# Patient Record
Sex: Female | Born: 1970 | Race: White | Hispanic: Yes | Marital: Married | Smoking: Never smoker
Health system: Southern US, Community
[De-identification: ages and names within clinical notes are randomized; demographics above are authoritative.]

## PROBLEM LIST (undated history)

## (undated) DIAGNOSIS — J302 Other seasonal allergic rhinitis: Secondary | ICD-10-CM

## (undated) DIAGNOSIS — E785 Hyperlipidemia, unspecified: Secondary | ICD-10-CM

## (undated) DIAGNOSIS — K5900 Constipation, unspecified: Secondary | ICD-10-CM

## (undated) DIAGNOSIS — Z923 Personal history of irradiation: Secondary | ICD-10-CM

## (undated) DIAGNOSIS — R748 Abnormal levels of other serum enzymes: Secondary | ICD-10-CM

## (undated) DIAGNOSIS — I8393 Asymptomatic varicose veins of bilateral lower extremities: Secondary | ICD-10-CM

## (undated) DIAGNOSIS — R739 Hyperglycemia, unspecified: Secondary | ICD-10-CM

## (undated) DIAGNOSIS — E669 Obesity, unspecified: Secondary | ICD-10-CM

## (undated) DIAGNOSIS — K760 Fatty (change of) liver, not elsewhere classified: Secondary | ICD-10-CM

## (undated) DIAGNOSIS — R920 Mammographic microcalcification found on diagnostic imaging of breast: Secondary | ICD-10-CM

## (undated) DIAGNOSIS — K644 Residual hemorrhoidal skin tags: Secondary | ICD-10-CM

## (undated) DIAGNOSIS — C50919 Malignant neoplasm of unspecified site of unspecified female breast: Secondary | ICD-10-CM

## (undated) HISTORY — DX: Obesity, unspecified: E66.9

## (undated) HISTORY — DX: Mammographic microcalcification found on diagnostic imaging of breast: R92.0

## (undated) HISTORY — DX: Malignant neoplasm of unspecified site of unspecified female breast: C50.919

## (undated) HISTORY — DX: Hyperlipidemia, unspecified: E78.5

## (undated) HISTORY — DX: Other seasonal allergic rhinitis: J30.2

## (undated) HISTORY — DX: Abnormal levels of other serum enzymes: R74.8

## (undated) HISTORY — DX: Hyperglycemia, unspecified: R73.9

## (undated) HISTORY — DX: Residual hemorrhoidal skin tags: K64.4

## (undated) HISTORY — DX: Asymptomatic varicose veins of bilateral lower extremities: I83.93

## (undated) HISTORY — DX: Constipation, unspecified: K59.00

## (undated) HISTORY — DX: Fatty (change of) liver, not elsewhere classified: K76.0

---

## 2003-03-19 ENCOUNTER — Other Ambulatory Visit: Admission: RE | Admit: 2003-03-19 | Discharge: 2003-03-19 | Payer: Self-pay | Admitting: Obstetrics and Gynecology

## 2003-06-24 ENCOUNTER — Inpatient Hospital Stay (HOSPITAL_COMMUNITY): Admission: AD | Admit: 2003-06-24 | Discharge: 2003-06-24 | Payer: Self-pay | Admitting: *Deleted

## 2003-08-15 ENCOUNTER — Inpatient Hospital Stay (HOSPITAL_COMMUNITY): Admission: AD | Admit: 2003-08-15 | Discharge: 2003-08-15 | Payer: Self-pay | Admitting: *Deleted

## 2003-08-19 ENCOUNTER — Inpatient Hospital Stay (HOSPITAL_COMMUNITY): Admission: AD | Admit: 2003-08-19 | Discharge: 2003-08-19 | Payer: Self-pay | Admitting: *Deleted

## 2003-09-02 ENCOUNTER — Inpatient Hospital Stay (HOSPITAL_COMMUNITY): Admission: AD | Admit: 2003-09-02 | Discharge: 2003-09-02 | Payer: Self-pay | Admitting: Obstetrics and Gynecology

## 2003-09-12 ENCOUNTER — Encounter: Admission: RE | Admit: 2003-09-12 | Discharge: 2003-09-12 | Payer: Self-pay | Admitting: *Deleted

## 2003-09-16 ENCOUNTER — Encounter: Admission: RE | Admit: 2003-09-16 | Discharge: 2003-09-16 | Payer: Self-pay | Admitting: *Deleted

## 2003-09-19 ENCOUNTER — Ambulatory Visit: Payer: Self-pay | Admitting: Obstetrics and Gynecology

## 2003-09-19 ENCOUNTER — Ambulatory Visit: Payer: Self-pay | Admitting: *Deleted

## 2003-09-19 ENCOUNTER — Inpatient Hospital Stay (HOSPITAL_COMMUNITY): Admission: AD | Admit: 2003-09-19 | Discharge: 2003-09-22 | Payer: Self-pay | Admitting: *Deleted

## 2003-10-02 ENCOUNTER — Ambulatory Visit: Payer: Self-pay | Admitting: Family Medicine

## 2003-12-22 ENCOUNTER — Ambulatory Visit: Payer: Self-pay | Admitting: Family Medicine

## 2004-04-06 ENCOUNTER — Ambulatory Visit: Payer: Self-pay | Admitting: Family Medicine

## 2004-04-07 ENCOUNTER — Ambulatory Visit: Payer: Self-pay | Admitting: *Deleted

## 2004-04-23 ENCOUNTER — Ambulatory Visit: Payer: Self-pay | Admitting: Family Medicine

## 2004-08-05 ENCOUNTER — Ambulatory Visit: Payer: Self-pay | Admitting: Internal Medicine

## 2005-01-04 ENCOUNTER — Ambulatory Visit: Payer: Self-pay | Admitting: Family Medicine

## 2005-04-14 ENCOUNTER — Ambulatory Visit: Payer: Self-pay | Admitting: Family Medicine

## 2005-06-15 ENCOUNTER — Ambulatory Visit: Payer: Self-pay | Admitting: Family Medicine

## 2005-06-16 ENCOUNTER — Ambulatory Visit: Payer: Self-pay | Admitting: Family Medicine

## 2005-10-06 ENCOUNTER — Ambulatory Visit: Payer: Self-pay | Admitting: Family Medicine

## 2005-12-01 ENCOUNTER — Ambulatory Visit: Payer: Self-pay | Admitting: Family Medicine

## 2006-02-15 ENCOUNTER — Ambulatory Visit: Payer: Self-pay | Admitting: Family Medicine

## 2006-07-19 ENCOUNTER — Ambulatory Visit: Payer: Self-pay | Admitting: Internal Medicine

## 2006-10-04 ENCOUNTER — Encounter (INDEPENDENT_AMBULATORY_CARE_PROVIDER_SITE_OTHER): Payer: Self-pay | Admitting: *Deleted

## 2006-10-09 ENCOUNTER — Ambulatory Visit (HOSPITAL_COMMUNITY): Admission: RE | Admit: 2006-10-09 | Discharge: 2006-10-09 | Payer: Self-pay | Admitting: Internal Medicine

## 2006-10-09 ENCOUNTER — Ambulatory Visit: Payer: Self-pay | Admitting: Internal Medicine

## 2006-10-26 ENCOUNTER — Ambulatory Visit: Payer: Self-pay | Admitting: Internal Medicine

## 2006-10-26 LAB — CONVERTED CEMR LAB
BUN: 13 mg/dL (ref 6–23)
Basophils Absolute: 0 10*3/uL (ref 0.0–0.1)
Basophils Relative: 0 % (ref 0–1)
CO2: 25 meq/L (ref 19–32)
Creatinine, Ser: 0.7 mg/dL (ref 0.40–1.20)
Eosinophils Absolute: 0.1 10*3/uL (ref 0.0–0.7)
Eosinophils Relative: 1 % (ref 0–5)
HCT: 40.1 % (ref 36.0–46.0)
Hemoglobin: 13.5 g/dL (ref 12.0–15.0)
Neutro Abs: 6.6 10*3/uL (ref 1.7–7.7)
Neutrophils Relative %: 71 % (ref 43–77)
RBC: 4.4 M/uL (ref 3.87–5.11)
Sodium: 139 meq/L (ref 135–145)

## 2006-10-27 ENCOUNTER — Encounter (INDEPENDENT_AMBULATORY_CARE_PROVIDER_SITE_OTHER): Payer: Self-pay | Admitting: Internal Medicine

## 2007-01-16 ENCOUNTER — Ambulatory Visit: Payer: Self-pay | Admitting: Internal Medicine

## 2007-06-07 ENCOUNTER — Ambulatory Visit: Payer: Self-pay | Admitting: Internal Medicine

## 2007-07-13 ENCOUNTER — Ambulatory Visit: Payer: Self-pay | Admitting: Internal Medicine

## 2007-12-17 ENCOUNTER — Ambulatory Visit: Payer: Self-pay | Admitting: Internal Medicine

## 2008-01-31 ENCOUNTER — Ambulatory Visit: Payer: Self-pay | Admitting: Internal Medicine

## 2008-02-07 ENCOUNTER — Encounter (INDEPENDENT_AMBULATORY_CARE_PROVIDER_SITE_OTHER): Payer: Self-pay | Admitting: Adult Health

## 2008-02-07 ENCOUNTER — Ambulatory Visit: Payer: Self-pay | Admitting: Internal Medicine

## 2008-02-07 LAB — CONVERTED CEMR LAB: GC Probe Amp, Genital: NEGATIVE

## 2008-03-05 ENCOUNTER — Ambulatory Visit: Payer: Self-pay | Admitting: Internal Medicine

## 2008-03-05 ENCOUNTER — Encounter (INDEPENDENT_AMBULATORY_CARE_PROVIDER_SITE_OTHER): Payer: Self-pay | Admitting: Adult Health

## 2008-03-05 LAB — CONVERTED CEMR LAB
ALT: 25 units/L (ref 0–35)
AST: 16 units/L (ref 0–37)
Basophils Relative: 0 % (ref 0–1)
CO2: 26 meq/L (ref 19–32)
Calcium: 9.4 mg/dL (ref 8.4–10.5)
Chloride: 106 meq/L (ref 96–112)
Cholesterol: 179 mg/dL (ref 0–200)
Eosinophils Relative: 1 % (ref 0–5)
Glucose, Bld: 75 mg/dL (ref 70–99)
HCT: 41.5 % (ref 36.0–46.0)
LDL Cholesterol: 114 mg/dL — ABNORMAL HIGH (ref 0–99)
Lymphocytes Relative: 32 % (ref 12–46)
MCV: 89.1 fL (ref 78.0–100.0)
Monocytes Absolute: 0.4 10*3/uL (ref 0.1–1.0)
Monocytes Relative: 5 % (ref 3–12)
Neutro Abs: 4.2 10*3/uL (ref 1.7–7.7)
RBC: 4.66 M/uL (ref 3.87–5.11)
Sodium: 141 meq/L (ref 135–145)
TSH: 0.73 microintl units/mL (ref 0.350–4.50)
Total Bilirubin: 0.6 mg/dL (ref 0.3–1.2)
Total CHOL/HDL Ratio: 3.5
VLDL: 14 mg/dL (ref 0–40)

## 2008-07-25 ENCOUNTER — Ambulatory Visit: Payer: Self-pay | Admitting: Internal Medicine

## 2008-09-04 ENCOUNTER — Ambulatory Visit: Payer: Self-pay | Admitting: Internal Medicine

## 2008-11-18 ENCOUNTER — Telehealth (INDEPENDENT_AMBULATORY_CARE_PROVIDER_SITE_OTHER): Payer: Self-pay | Admitting: *Deleted

## 2009-03-20 ENCOUNTER — Ambulatory Visit (HOSPITAL_COMMUNITY): Admission: RE | Admit: 2009-03-20 | Discharge: 2009-03-20 | Payer: Self-pay | Admitting: Family Medicine

## 2009-03-20 ENCOUNTER — Encounter (INDEPENDENT_AMBULATORY_CARE_PROVIDER_SITE_OTHER): Payer: Self-pay | Admitting: Family Medicine

## 2009-03-20 ENCOUNTER — Ambulatory Visit: Payer: Self-pay | Admitting: Internal Medicine

## 2009-03-20 LAB — CONVERTED CEMR LAB
Albumin: 4.5 g/dL (ref 3.5–5.2)
CO2: 27 meq/L (ref 19–32)
Calcium: 8.9 mg/dL (ref 8.4–10.5)
Chloride: 105 meq/L (ref 96–112)
Eosinophils Relative: 2 % (ref 0–5)
Glucose, Bld: 88 mg/dL (ref 70–99)
INR: 0.95 (ref <1.50)
Lymphocytes Relative: 39 % (ref 12–46)
Lymphs Abs: 2.7 10*3/uL (ref 0.7–4.0)
MCHC: 33.5 g/dL (ref 30.0–36.0)
MCV: 89.6 fL (ref 78.0–100.0)
Monocytes Absolute: 0.6 10*3/uL (ref 0.1–1.0)
Platelets: 200 10*3/uL (ref 150–400)
Potassium: 4.4 meq/L (ref 3.5–5.3)
Prothrombin Time: 12.6 s (ref 11.6–15.2)
RDW: 12.8 % (ref 11.5–15.5)
Sodium: 139 meq/L (ref 135–145)
Total Bilirubin: 0.3 mg/dL (ref 0.3–1.2)
Total Protein: 6.8 g/dL (ref 6.0–8.3)
WBC: 7 10*3/uL (ref 4.0–10.5)

## 2009-05-15 ENCOUNTER — Ambulatory Visit: Payer: Self-pay | Admitting: Family Medicine

## 2009-05-15 LAB — CONVERTED CEMR LAB: GC Probe Amp, Genital: NEGATIVE

## 2009-08-06 ENCOUNTER — Ambulatory Visit: Payer: Self-pay | Admitting: Family Medicine

## 2009-08-10 ENCOUNTER — Ambulatory Visit: Payer: Self-pay | Admitting: Family Medicine

## 2009-08-10 LAB — CONVERTED CEMR LAB: Vit D, 25-Hydroxy: 32 ng/mL (ref 30–89)

## 2009-12-01 ENCOUNTER — Emergency Department (HOSPITAL_COMMUNITY)
Admission: EM | Admit: 2009-12-01 | Discharge: 2009-12-01 | Payer: Self-pay | Source: Home / Self Care | Admitting: Emergency Medicine

## 2010-06-09 ENCOUNTER — Other Ambulatory Visit: Payer: Self-pay | Admitting: Family Medicine

## 2013-02-13 ENCOUNTER — Other Ambulatory Visit (HOSPITAL_COMMUNITY): Payer: Self-pay | Admitting: Internal Medicine

## 2013-04-02 ENCOUNTER — Other Ambulatory Visit (HOSPITAL_COMMUNITY): Payer: Self-pay | Admitting: Internal Medicine

## 2013-04-02 DIAGNOSIS — Z1231 Encounter for screening mammogram for malignant neoplasm of breast: Secondary | ICD-10-CM

## 2013-04-16 ENCOUNTER — Ambulatory Visit (HOSPITAL_COMMUNITY)
Admission: RE | Admit: 2013-04-16 | Discharge: 2013-04-16 | Disposition: A | Discharge status: Home / Self Care | Payer: Self-pay | Source: Ambulatory Visit | Attending: Internal Medicine | Admitting: Internal Medicine

## 2013-04-16 DIAGNOSIS — Z1231 Encounter for screening mammogram for malignant neoplasm of breast: Secondary | ICD-10-CM

## 2013-04-18 ENCOUNTER — Other Ambulatory Visit: Payer: Self-pay | Admitting: Internal Medicine

## 2013-04-18 DIAGNOSIS — R928 Other abnormal and inconclusive findings on diagnostic imaging of breast: Secondary | ICD-10-CM

## 2013-05-09 ENCOUNTER — Telehealth: Payer: Self-pay | Admitting: *Deleted

## 2013-05-09 DIAGNOSIS — R102 Pelvic and perineal pain: Secondary | ICD-10-CM

## 2013-05-09 NOTE — Telephone Encounter (Signed)
Attempted to contact patient through interpreter about ultrasound appt., no answer, left message with date/time of appt.

## 2013-05-10 ENCOUNTER — Ambulatory Visit
Admission: RE | Admit: 2013-05-10 | Discharge: 2013-05-10 | Disposition: A | Discharge status: Home / Self Care | Payer: No Typology Code available for payment source | Source: Ambulatory Visit | Attending: Internal Medicine | Admitting: Internal Medicine

## 2013-05-10 ENCOUNTER — Encounter (INDEPENDENT_AMBULATORY_CARE_PROVIDER_SITE_OTHER): Payer: Self-pay

## 2013-05-10 DIAGNOSIS — R928 Other abnormal and inconclusive findings on diagnostic imaging of breast: Secondary | ICD-10-CM

## 2013-05-15 ENCOUNTER — Ambulatory Visit (HOSPITAL_COMMUNITY)
Admission: RE | Admit: 2013-05-15 | Discharge: 2013-05-15 | Disposition: A | Discharge status: Home / Self Care | Payer: Self-pay | Source: Ambulatory Visit | Attending: Obstetrics & Gynecology | Admitting: Obstetrics & Gynecology

## 2013-05-15 DIAGNOSIS — R102 Pelvic and perineal pain: Secondary | ICD-10-CM

## 2013-05-15 DIAGNOSIS — N938 Other specified abnormal uterine and vaginal bleeding: Secondary | ICD-10-CM | POA: Insufficient documentation

## 2013-05-15 DIAGNOSIS — N949 Unspecified condition associated with female genital organs and menstrual cycle: Secondary | ICD-10-CM | POA: Insufficient documentation

## 2013-05-15 DIAGNOSIS — N925 Other specified irregular menstruation: Secondary | ICD-10-CM | POA: Insufficient documentation

## 2013-06-01 ENCOUNTER — Encounter: Payer: Self-pay | Admitting: *Deleted

## 2013-07-01 ENCOUNTER — Ambulatory Visit (INDEPENDENT_AMBULATORY_CARE_PROVIDER_SITE_OTHER): Payer: Self-pay | Admitting: Obstetrics & Gynecology

## 2013-07-01 ENCOUNTER — Encounter: Payer: Self-pay | Admitting: Obstetrics & Gynecology

## 2013-07-01 VITALS — BP 105/67 | HR 88 | Wt 163.9 lb

## 2013-07-01 DIAGNOSIS — N72 Inflammatory disease of cervix uteri: Secondary | ICD-10-CM

## 2013-07-01 LAB — WET PREP, GENITAL
Clue Cells Wet Prep HPF POC: NONE SEEN
Trich, Wet Prep: NONE SEEN

## 2013-07-01 LAB — POCT PREGNANCY, URINE: PREG TEST UR: NEGATIVE

## 2013-07-01 MED ORDER — CEFTRIAXONE SODIUM 250 MG IJ SOLR
250.0000 mg | Freq: Once | INTRAMUSCULAR | Status: AC
  Administered 2013-07-01: 250 mg via INTRAMUSCULAR

## 2013-07-01 MED ORDER — DOXYCYCLINE HYCLATE 100 MG PO CAPS: 100.0000 mg | ORAL_CAPSULE | Freq: Two times a day (BID) | ORAL | Status: DC

## 2013-07-01 NOTE — Progress Notes (Signed)
Patient ID: Nicole Holloway, female   DOB: 12/02/1970, 43 y.o.   MRN: [id number]  Chief Complaint  Patient presents with  . abnormal bleeding    HPI Nicole Holloway is a 43 y.o. female.  [id number] No LMP recorded. 5 months of intermenstrual spotting with regular normal menses, some suprapubic pain, no effect after treatment for BV and yeast. Pap was normal.  HPI  History reviewed. No pertinent past medical history.  Past Surgical History  Procedure Laterality Date  . Cesarean section      Family History  Problem Relation Age of Onset  . Diabetes Father     Social History History  Substance Use Topics  . Smoking status: Never Smoker   . Smokeless tobacco: Never Used  . Alcohol Use: No    No Known Allergies  Current Outpatient Prescriptions  Medication Sig Dispense Refill  . doxycycline (VIBRAMYCIN) 100 MG capsule Take 1 capsule (100 mg total) by mouth 2 (two) times daily.  14 capsule  0   No current facility-administered medications for this visit.    Review of Systems Review of Systems  Constitutional: Negative.   Genitourinary: Positive for vaginal bleeding, vaginal discharge and pelvic pain. Negative for menstrual problem.    Blood pressure 105/67, pulse 88, weight 74.345 kg (163 lb 14.4 oz).  Physical Exam Physical Exam  Constitutional: She is oriented to person, place, and time. She appears well-developed. No distress.  Abdominal: Soft. There is no tenderness.  Genitourinary: Vaginal discharge (spotting) found.  Mild CMT and uterus normal no mass  Neurological: She is alert and oriented to person, place, and time.  Skin: Skin is warm.  Psychiatric: She has a normal mood and affect. Her behavior is normal.    Data Reviewed referral  Assessment    STD probe and wet prep, exam c/w PID, cervicitis     Plan    Rocephin 250 mg IM and doxycycline 100 mg BID 7 days. RTC 4 weeks prn        [name] 07/01/2013, 3:49 PM

## 2013-07-01 NOTE — Patient Instructions (Signed)
Hemorragia uterina disfuncional (Dysfunctional Uterine Bleeding) Normalmente, los perodos menstruales comienzan en las jvenes de entre 11 y 62 aos. Un ciclo o perodo menstrual puede repetirse [employer] [address] y los 60 das y dura [place] 1 y 77 das. [employer] 12 y los 14 das antes de que comience el ciclo menstrual, se produce la ovulacin (los ovarios producen vulos). Cuando cuente los periodos, hgalo desde Engineer, manufacturing systems del comienzo de la hemorragia del perodo anterior Management consultant de la hemorragia del perodo siguiente. La hemorragia uterina disfuncional (anormal) es diferente del perodo menstrual normal. Los perodos pueden comenzar antes o despus que lo habitual. Pueden ser menos abundantes, presentar cogulos, o ser ms abundantes. Puede tener [employer] perodos o IT consultant un perodo o ms. Puede tener hemorragia luego de [employer], despus de la menopausia o faltarle el perodo menstrual. CAUSAS  Embarazo (normal, aborto espontneo, embarazo ectpico).  DIU (dispositivo intrauterino, anticonceptivo)  Pldoras anticonceptivas  Tratamiento hormonal  La menopausia  Infeccin en el cervix.  Problemas de coagulacin.  Infecciones de la superficie interna del tero.  Endometriosis: la superficie interna del tero se desarrolla en la pelvis y otros rganos femeninos.  Adherencias (tejido cicatrizal) dentro del tero.  Obesidad o severa prdida de peso.  Plipos en el tero.  Cncer en el crvix, la vagina o el tero.  Quiste de ovarios o Sndrome ovrico poliqustico.  Otras enfermedades (diabetes, enfermedad de la tiroides, Social research officer, government).  Fibromas en el tero (tumores no cancerosos).  Problemas con las hormonas femeninas.  Hiperplasia del endometrio: capa gruesa y clulas agrandadas dentro del tero.  Medicamentos que interfieren con la ovulacin.  Radiacin en la pelvis o el abdomen.  Quimioterapia. DIAGNSTICO  El mdico  Marketing executive historia de sus perodos [place], los medicamentos que toma, los cambios en el peso corporal, el estrs de la vida diaria y cualquier problema mdico que tenga.  El mdico realizar un examen fsico, incluyendo un examen plvico.  Tambin le solicitar anlisis para completar el diagnstico. Ellos son:  Papanicolau.  Anlisis de [place].  Cultivos para descartar infecciones.  Tomografa computada.  Ultrasonido.  Histeroscopa.  Laparoscopa.  Resonancia magntica.  Histerosalpingografa.  Dilatacin y curetaje.  Biopsia de endometrio. TRATAMIENTO El tratamiento depender de la causa de la hemorragia.. El tratamiento consiste en:  Observacin de los perodos menstruales durante algunos meses.  Prescripcin de medicamentos como:  Antibiticos:  Hormonas.  Pldoras anticonceptivas  Retirar el DIU (dispositivo intrauterino, anticonceptivo).  Ciruga:  Dilatacin y curetaje (raspado y remocin de tejido de la zona interna del tero).  Laparoscopa (examen del interior del abdomen con un tubo con luz).  Ablacin uterina (destruccin de la membrana que cubre el tero con corriente elctrica, rayos lser, congelamiento o calor ).  Histeroscopa (examen del cuello y el tero con un tubo con luz).  Histerectoma (extirpacin del tero). INSTRUCCIONES PARA EL CUIDADO DOMICILIARIO  Si el profesional que la asiste le prescribe medicamentos, tmelos tal como se le indic. No cambie ni reemplace medicamentos sin consultarlo con Writer.  Las hemorragias de larga duracin pueden traen como consecuencia un dficit de hierro. El profesional que lo asiste podr Conservation officer, nature. Esto ayuda a Air cabin crew que el organismo pierde luego de una hemorragia abundante. Tome los medicamentos tal como se le indic.  No tome aspirina o medicamentos que la contengan u desde una semana antes del perodo menstrual ni durante el mismo. La aspirina puede hacer  que la hemorragia empeore.  Si necesita cambiar el apsito o el tampn mas de una vez cada 2 horas, permanezca en cama con los pies elevados y aplique una compresa fra en la zona baja del abdomen. Descanse todo lo que pueda hasta que la hemorragia se detenga.  Consuma alimentos balanceados. Coma alimentos ricos en hierro. Por ejemplo:  Vegetales verdes frescos.  Cereales integrales y cereales y panes con salvado.  Huevos.  Carnes.  Hgado.  No trate de perder peso hasta que la hemorragia anormal se detenga y los niveles de hierro en la sangre vuelvan a la normalidad. No levante pesos de ms de 10 libras (4.5 kg.) ni realice actividades extenuantes mientras tenga la hemorragia.  Durante un par de meses tome nota en un almanaque y [employer] comienzo y el fin de su perodo menstrual y el tipo de sangrado (escaso, [place], [place], gotas, cogulos o falta de perodo). El objetivo es que su mdico evale mejor el problema. SOLICITE ATENCIN MDICA SI:  Debe cambiar el apsito o el tampn ms de una vez cada hora.  Se siente mareada o dbil.  Tiene algn problema que pueda relacionarse con el medicamento que est tomando.  Siente dolor.  Desea retirar su DIU.  Quiere suspender o cambiar sus pldoras anticonceptivas u hormonas.  Tiene algn tipo de hemorragia anormal mencionada anteriormente.  Tiene ms de 45 aos y an no ha tenido su perodo menstrual.  Tiene 45 aos y an tiene Chartered certified accountant.  Tiene alguno de los sntomas mencionados anteriormente.  Aparece una erupcin cutnea. SOLICITE ATENCIN MDICA DE INMEDIATO SI:  La temperatura oral se eleva sin motivo por encima de 38,9 C (102 F).  Comienza a sentir escalofros.  Debe cambiar el apsito o el tampn ms de una vez cada hora.  Siente dolor abdominal.  Pierde el conocimiento, se desmaya. Document Released: 10/13/2004 Document Revised: 09/28/2011 [facility] Patient Information 2014 [place], [place].

## 2013-07-01 NOTE — Progress Notes (Signed)
Patient reports that she has a period every month and then it will stop but bleeding comes back. She bleeds everyday

## 2013-07-03 ENCOUNTER — Telehealth: Payer: Self-pay | Admitting: General Practice

## 2013-07-03 ENCOUNTER — Other Ambulatory Visit: Payer: Self-pay

## 2013-07-03 DIAGNOSIS — B379 Candidiasis, unspecified: Secondary | ICD-10-CM

## 2013-07-03 DIAGNOSIS — N72 Inflammatory disease of cervix uteri: Secondary | ICD-10-CM | POA: Insufficient documentation

## 2013-07-03 MED ORDER — FLUCONAZOLE 150 MG PO TABS: 150.0000 mg | ORAL_TABLET | Freq: Once | ORAL | Status: DC

## 2013-07-03 NOTE — Telephone Encounter (Signed)
Patient came to front desk. Explained to patient with help of Manuela Schwartz results and medication at pharmacy. Patient verbalized understanding. No questions or concerns.

## 2013-07-03 NOTE — Telephone Encounter (Signed)
Patient called and requests Diflucan be sent to [facility] on [address] as it will be cheaper. E-prescribed to preferred pharmacy.

## 2013-07-03 NOTE — Telephone Encounter (Signed)
Message copied by Shelly Coss on Wed Jul 03, 2013 11:01 AM ------      Message from: Woodroe Mode      Created: Wed Jul 03, 2013  9:15 AM       Diflucan 150 mg po for yeast ------

## 2013-07-03 NOTE — Telephone Encounter (Signed)
Called patient with pacific interpreter [phone] and patient's listed number was invalid. Called patient at contact's number and he stated he wasn't with her but would have her call us here. Med ordered

## 2013-07-04 LAB — GC/CHLAMYDIA PROBE AMP
CT Probe RNA: NEGATIVE
GC PROBE AMP APTIMA: NEGATIVE

## 2013-07-29 ENCOUNTER — Telehealth: Payer: Self-pay | Admitting: General Practice

## 2013-07-29 ENCOUNTER — Encounter: Payer: Self-pay | Admitting: General Practice

## 2013-07-29 ENCOUNTER — Ambulatory Visit: Payer: Self-pay | Admitting: Obstetrics & Gynecology

## 2013-07-29 NOTE — Telephone Encounter (Signed)
Attempted to call patient with blanca for interpreter but only listed number was invalid. Will send letter

## 2013-10-04 ENCOUNTER — Other Ambulatory Visit: Payer: Self-pay | Admitting: Internal Medicine

## 2013-10-04 DIAGNOSIS — R921 Mammographic calcification found on diagnostic imaging of breast: Secondary | ICD-10-CM

## 2013-10-16 ENCOUNTER — Ambulatory Visit
Admission: RE | Admit: 2013-10-16 | Discharge: 2013-10-16 | Disposition: A | Discharge status: Home / Self Care | Payer: No Typology Code available for payment source | Source: Ambulatory Visit | Attending: Internal Medicine | Admitting: Internal Medicine

## 2013-10-16 DIAGNOSIS — R921 Mammographic calcification found on diagnostic imaging of breast: Secondary | ICD-10-CM

## 2013-10-28 ENCOUNTER — Ambulatory Visit
Admission: RE | Admit: 2013-10-28 | Discharge: 2013-10-28 | Disposition: A | Discharge status: Home / Self Care | Payer: No Typology Code available for payment source | Source: Ambulatory Visit | Attending: Internal Medicine | Admitting: Internal Medicine

## 2013-10-28 ENCOUNTER — Other Ambulatory Visit: Payer: Self-pay | Admitting: Internal Medicine

## 2013-10-28 DIAGNOSIS — R921 Mammographic calcification found on diagnostic imaging of breast: Secondary | ICD-10-CM

## 2013-11-18 ENCOUNTER — Ambulatory Visit (INDEPENDENT_AMBULATORY_CARE_PROVIDER_SITE_OTHER): Payer: Self-pay | Admitting: Obstetrics & Gynecology

## 2013-11-18 ENCOUNTER — Encounter: Payer: Self-pay | Admitting: Obstetrics & Gynecology

## 2013-11-18 VITALS — BP 103/54 | HR 79 | Temp 99.1°F | Wt 171.9 lb

## 2013-11-18 DIAGNOSIS — N938 Other specified abnormal uterine and vaginal bleeding: Secondary | ICD-10-CM

## 2013-11-18 NOTE — Patient Instructions (Signed)
Perimenopausia (Perimenopause) La perimenopausia es el momento en que su cuerpo comienza a pasar a la menopausia (sin menstruacin durante 12 meses consecutivos). Es un proceso natural. La perimenopausia puede comenzar entre 2 y 33 aos antes de la menopausia y por lo general tiene una duracin de 1 ao ms pasada la menopausia. [employer], los ovarios podran producir un vulo o no. Los ovarios varan su produccin de las hormonas estrgeno y Technical brewer. Esto puede causar perodos menstruales irregulares, dificultad para quedar embarazada, hemorragia vaginal entre perodos y sntomas incmodos. CAUSAS  Produccin irregular de las hormonas ovricas estrgeno y Immunologist, y no ovular todos los meses.  Otras causas son:  Tumor de la glndula pituitaria.  Enfermedades que [employer] ovarios.  Radioterapia.  Quimioterapia.  Causas desconocidas.  Fumar mucho y abusar del consumo de alcohol puede llevar a que la perimenopausia aparezca antes. SIGNOS Y SNTOMAS   Acaloramiento.  Sudoracin nocturna.  Perodos menstruales irregulares.  Disminucin del deseo sexual.  Sequedad vaginal.  Dolores de cabeza.  Cambios en el estado de nimo.  Depresin.  Problemas de memoria.  Irritabilidad.  Cansancio.  Aumento de [place].  Problemas para quedar embarazada.  Prdida de clulas seas (osteoporosis).  Comienzo de endurecimiento de las arterias (aterosclerosis). DIAGNSTICO  El mdico realizar un diagnstico en funcin de su edad, historial de perodos menstruales y sntomas. Le realizarn un examen fsico para ver si hay algn cambio en su cuerpo, en especial en sus rganos reproductores. Las pruebas hormonales pueden ser o no tiles segn la cantidad de hormonas femeninas que produzca y [employer] produzca. Sin embargo, podrn Microbiologist pruebas hormonales para Statistician. TRATAMIENTO  En algunos casos, no se necesita tratamiento. La  decisin acerca de qu tratamiento es necesario durante la perimenopausia deber realizarse en conjunto con su mdico segn cmo estn afectando los sntomas a su estilo de vida. Existen varios tratamientos disponibles, como:  Risk manager cada sntoma individual con medicamentos especficos para ese sntoma.  Algunos medicamentos herbales pueden ayudar en sntomas especficos.  Psicoterapia.  Terapia grupal. INSTRUCCIONES PARA EL CUIDADO EN EL HOGAR   Controle sus periodos menstruales (cundo ocurren, qu tan abundantes son, cunto tiempo pasa entre perodos, y cunto duran) como tambin sus sntomas y cundo comenzaron.  Tome slo medicamentos de venta libre o recetados, segn las indicaciones del mdico.  Duerma y descanse.  Haga actividad fsica.  Consuma una dieta que contenga calcio (bueno para los [place]) y productos derivados de la soja (actan como estrgenos).  No fume.  Evite las bebidas alcohlicas.  Tome los suplementos vitamnicos segn las indicaciones del mdico. En ciertos casos, puede ser de [place] tomar vitamina E.  Tome suplementos de calcio y vitamina D para ayudar a Publishing rights manager prdida sea.  En algunos casos la terapia de grupo podr ayudarla.  La acupuntura puede ser de ayuda en ciertos casos. SOLICITE ATENCIN MDICA SI:   Tiene preguntas acerca de sus sntomas.  Necesita ser derivada a un especialista (gineclogo, psiquiatra, o psiclogo). SOLICITE ATENCIN MDICA DE INMEDIATO SI:   Sufre una hemorragia vaginal abundante.  Su perodo menstrual dura ms de 8 das.  Sus perodos son recurrentes cada menos de [address]  Tiene hemorragias durante las [employer].  Est muy deprimido.  Siente dolor al [employer].  Siente dolor de cabeza intenso.  Tiene problemas de visin. Document Released: 01/03/2005 Document Revised: 10/24/2012 [facility] Patient Information 2015 [place], [place]. This information is not intended to replace advice given to you  by your health care provider. Make sure you discuss any questions you have with your health care provider.

## 2013-11-18 NOTE — Progress Notes (Signed)
Subjective:     Patient ID: Nicole Holloway, female   DOB: Oct 16, 1970, 43 y.o.   MRN: [id number]  HPI Continues to have irregular bleeding, sometimes heavy, no pain.   Review of Systems  Constitutional: Positive for unexpected weight change. Negative for fever and fatigue.  Genitourinary: Positive for menstrual problem. Negative for vaginal discharge and pelvic pain.       Objective:   Physical Exam  Constitutional: She is oriented to person, place, and time. She appears well-developed. No distress.  Night sweats  Pulmonary/Chest: Effort normal. No respiratory distress.  Neurological: She is alert and oriented to person, place, and time.  Skin: No pallor.  Psychiatric: She has a normal mood and affect. Her behavior is normal.       Assessment:     DUB, suspect perimenopause    Plan:     TSH, FSH, CBC. Provera 5 mg, 2 PO until no bleeding then 1 daily, RTC 3 mo   Woodroe Mode, MD 11/18/2013

## 2013-11-19 LAB — CBC
HCT: 36.6 % (ref 36.0–46.0)
HEMOGLOBIN: 12.8 g/dL (ref 12.0–15.0)
MCH: 30.3 pg (ref 26.0–34.0)
MCHC: 35 g/dL (ref 30.0–36.0)
MCV: 86.7 fL (ref 78.0–100.0)
PLATELETS: 209 10*3/uL (ref 150–400)
RBC: 4.22 MIL/uL (ref 3.87–5.11)
RDW: 13.7 % (ref 11.5–15.5)
WBC: 7.2 10*3/uL (ref 4.0–10.5)

## 2013-11-19 LAB — FOLLICLE STIMULATING HORMONE: FSH: 2.8 m[IU]/mL

## 2013-11-19 LAB — TSH: TSH: 0.799 u[IU]/mL (ref 0.350–4.500)

## 2013-11-20 ENCOUNTER — Telehealth: Payer: Self-pay | Admitting: *Deleted

## 2013-11-20 NOTE — Telephone Encounter (Signed)
Called patient back using spanish interpreter Larkin Ina, no answer message left to call us back.

## 2013-11-20 NOTE — Telephone Encounter (Signed)
Patient left message with her phone number and that she speaks spanish.

## 2013-11-25 ENCOUNTER — Telehealth: Payer: Self-pay | Admitting: General Practice

## 2013-11-25 DIAGNOSIS — N938 Other specified abnormal uterine and vaginal bleeding: Secondary | ICD-10-CM

## 2013-11-25 MED ORDER — MEDROXYPROGESTERONE ACETATE 5 MG PO TABS: ORAL_TABLET | ORAL | Status: DC

## 2013-11-25 NOTE — Telephone Encounter (Signed)
Charted under another encounter

## 2013-11-25 NOTE — Telephone Encounter (Signed)
Patient called and left message stating she went to her pharmacy and the medication isn't there. Per chart review medication was never ordered during last visit here. Called Dr Roselie Awkward to verify provera 5mg - take 10 mg daily until bleeding stops then one tab 5mg  daily disp 60 with 2 refills. Called patient back with Cristie Hem for interpreter and informed patient of medication being sent to pharmacy. Patient verbalized understanding and had no other questions

## 2013-11-27 ENCOUNTER — Telehealth: Payer: Self-pay | Admitting: *Deleted

## 2013-11-27 NOTE — Telephone Encounter (Addendum)
Pt left voice mail message in [place]. Will return call to pt with interpreter.  I called pt @ 1545 with Engineer, site # [phone].  Pt wanted to know her results from recent visit. I advised her that all of her lab test results were normal. Pt voiced understanding and had no additional questions.

## 2014-04-29 ENCOUNTER — Other Ambulatory Visit: Payer: Self-pay | Admitting: Internal Medicine

## 2014-04-29 ENCOUNTER — Other Ambulatory Visit: Payer: Self-pay

## 2014-04-29 DIAGNOSIS — R921 Mammographic calcification found on diagnostic imaging of breast: Secondary | ICD-10-CM

## 2014-05-02 ENCOUNTER — Other Ambulatory Visit: Payer: Self-pay | Admitting: Internal Medicine

## 2014-05-02 ENCOUNTER — Ambulatory Visit
Admission: RE | Admit: 2014-05-02 | Discharge: 2014-05-02 | Disposition: A | Discharge status: Home / Self Care | Payer: No Typology Code available for payment source | Source: Ambulatory Visit | Attending: Internal Medicine | Admitting: Internal Medicine

## 2014-05-02 DIAGNOSIS — R921 Mammographic calcification found on diagnostic imaging of breast: Secondary | ICD-10-CM

## 2014-06-04 DIAGNOSIS — E785 Hyperlipidemia, unspecified: Secondary | ICD-10-CM

## 2014-06-04 HISTORY — DX: Hyperlipidemia, unspecified: E78.5

## 2014-06-09 DIAGNOSIS — R739 Hyperglycemia, unspecified: Secondary | ICD-10-CM

## 2014-06-09 HISTORY — DX: Hyperglycemia, unspecified: R73.9

## 2014-07-03 DIAGNOSIS — R920 Mammographic microcalcification found on diagnostic imaging of breast: Secondary | ICD-10-CM

## 2014-07-03 HISTORY — DX: Mammographic microcalcification found on diagnostic imaging of breast: R92.0

## 2014-09-05 DIAGNOSIS — K760 Fatty (change of) liver, not elsewhere classified: Secondary | ICD-10-CM

## 2014-09-05 HISTORY — DX: Fatty (change of) liver, not elsewhere classified: K76.0

## 2014-11-25 ENCOUNTER — Encounter: Payer: Self-pay | Admitting: Internal Medicine

## 2014-11-25 DIAGNOSIS — K644 Residual hemorrhoidal skin tags: Secondary | ICD-10-CM | POA: Insufficient documentation

## 2014-11-25 DIAGNOSIS — I8393 Asymptomatic varicose veins of bilateral lower extremities: Secondary | ICD-10-CM | POA: Insufficient documentation

## 2014-11-25 DIAGNOSIS — K5900 Constipation, unspecified: Secondary | ICD-10-CM | POA: Insufficient documentation

## 2014-11-25 DIAGNOSIS — J302 Other seasonal allergic rhinitis: Secondary | ICD-10-CM | POA: Insufficient documentation

## 2014-11-26 ENCOUNTER — Encounter: Payer: Self-pay | Admitting: Internal Medicine

## 2014-11-26 ENCOUNTER — Ambulatory Visit (INDEPENDENT_AMBULATORY_CARE_PROVIDER_SITE_OTHER): Payer: Self-pay | Admitting: Internal Medicine

## 2014-11-26 VITALS — BP 116/78 | HR 76 | Ht 62.0 in | Wt 167.0 lb

## 2014-11-26 DIAGNOSIS — K760 Fatty (change of) liver, not elsewhere classified: Secondary | ICD-10-CM

## 2014-11-26 DIAGNOSIS — E669 Obesity, unspecified: Secondary | ICD-10-CM

## 2014-11-26 NOTE — Progress Notes (Signed)
   Subjective:    Patient ID: Nicole Holloway, female    DOB: 02-18-1970, 44 y.o.   MRN: [id number]  HPI  Fatty Liver with history of elevated transaminases: FLP in July with total cholesterol of 156, HDL 40, Trig nl, LDL 98. Trying to be physically active--considering swimming with daughter. Morning meal:  Oatmeal with green apple and celery and OJ and water and nuts and flaxseed. Lunch:  Sandwich with whole grain, ham, cheese, mayo, tomato, lettuce and onion.  Water. Was drinking a lot of soda before--3 weeks ago.  Stopped when started working.   Dinner:  Baked or grilled chicken, rice, cooked veggies.   Eats with family with TV off almost daily    Review of Systems     Objective:   Physical Exam No change       Assessment & Plan:  Fatty Liver with history of elevated transaminases, the latter resolved. Has lost 4 lbs since August. Much more active and with a much better diet. Long discussion on other ways she can improve her lifestyle. Seems very interested in doing this for both herself and her daughter. Discussed free flu vaccines on Nov 17th during orange card sign up.

## 2014-11-26 NOTE — Patient Instructions (Signed)
Drink a glass of water before every meal Drink 6-8 glasses of water daily Eat three meals daily Eat a protein and healthy fat with every meal (eggs,fish, chicken, turkey and limit red meats) Eat 5 servings of vegetables daily, mix the colors Eat 2 servings of fruit daily with skin, if skin is edible Use smaller plates Put food/utensils down as you chew and swallow each bite Eat at a table with friends/family at least once daily, no TV Do not eat in front of the TV 

## 2015-01-18 DIAGNOSIS — Z923 Personal history of irradiation: Secondary | ICD-10-CM

## 2015-01-18 HISTORY — DX: Personal history of irradiation: Z92.3

## 2015-01-19 ENCOUNTER — Encounter: Payer: Self-pay | Admitting: Internal Medicine

## 2015-02-05 ENCOUNTER — Ambulatory Visit (INDEPENDENT_AMBULATORY_CARE_PROVIDER_SITE_OTHER): Payer: Self-pay | Admitting: Internal Medicine

## 2015-02-05 ENCOUNTER — Encounter: Payer: Self-pay | Admitting: Internal Medicine

## 2015-02-05 VITALS — BP 110/76 | HR 76 | Temp 98.3°F | Ht 62.0 in | Wt 168.5 lb

## 2015-02-05 DIAGNOSIS — Z91048 Other nonmedicinal substance allergy status: Secondary | ICD-10-CM

## 2015-02-05 DIAGNOSIS — Z9109 Other allergy status, other than to drugs and biological substances: Secondary | ICD-10-CM

## 2015-02-05 MED ORDER — FEXOFENADINE HCL 180 MG PO TABS: 180.0000 mg | ORAL_TABLET | Freq: Every day | ORAL | Status: DC

## 2015-02-05 NOTE — Progress Notes (Signed)
   Subjective:    Patient ID: Nicole Holloway, female    DOB: 02-07-1970, 45 y.o.   MRN: [id number]  HPI  1.  Red eyes:  Had red eyes for 2 days earlier in the week, but still with burning and itching of both eyes for 4 days.  Occurred about 2-3 weeks ago, lasting about 3 days as well. Her left eyelid has been twitching as well. Has itching of nose and today, nose started to run--mucous is clear.  Not really sneezing.  No throat symptoms. Dry cough. Pt. Has history of seasonal allergies--but has not had the redness before. No fever. Has not used any OTC medications for this.  Has not been outside much.    2.  Chest pain:  High sternal area.  Started at beginning of week with red eyes.  Has been coughing.  Hurts with cough.  Hurts only when presses in area.  Meds:  None  No Known Allergies  Review of Systems     Objective:   Physical Exam HEENT:  PERRL, EOMI, conjunctivae mildly injected bilaterally, dark circles under eyes, Nasal mucosa swollen, cobbling of posterior pharynx.  TMs pearly gray Neck:  Supple, no adenopathy Chest: CTA  Tender over parasternal/costochondral area--reproduces the chest pain CV:  RRR without murmur or rub       Assessment & Plan:  Allergies with recent rain and increased temp vs. Viral  Chest wall pain from cough Recommended Fexofenadine 180 mg daily. If not relieved, add nasal corticosteroids.

## 2015-02-05 NOTE — Patient Instructions (Signed)
Allegra (Fexofenadine)  180 mg una vez al dia para alergias (a necesita)

## 2015-02-25 ENCOUNTER — Ambulatory Visit (INDEPENDENT_AMBULATORY_CARE_PROVIDER_SITE_OTHER): Payer: Self-pay | Admitting: Internal Medicine

## 2015-02-25 VITALS — BP 112/76 | HR 74 | Ht 62.0 in | Wt 171.0 lb

## 2015-02-25 DIAGNOSIS — M62838 Other muscle spasm: Secondary | ICD-10-CM

## 2015-02-25 DIAGNOSIS — R5382 Chronic fatigue, unspecified: Secondary | ICD-10-CM

## 2015-02-25 DIAGNOSIS — R748 Abnormal levels of other serum enzymes: Secondary | ICD-10-CM

## 2015-02-25 DIAGNOSIS — Z23 Encounter for immunization: Secondary | ICD-10-CM

## 2015-02-25 DIAGNOSIS — K760 Fatty (change of) liver, not elsewhere classified: Secondary | ICD-10-CM

## 2015-02-25 DIAGNOSIS — M6580 Other synovitis and tenosynovitis, unspecified site: Secondary | ICD-10-CM

## 2015-02-25 DIAGNOSIS — M76891 Other specified enthesopathies of right lower limb, excluding foot: Secondary | ICD-10-CM

## 2015-02-25 NOTE — Progress Notes (Signed)
   Subjective:    Patient ID: Nicole Holloway, female    DOB: July 12, 1970, 45 y.o.   MRN: [id number]  HPI  1.  Red eyes and chest pain improved since seen recently and started on Allegra.  2.  Hepatic steatosis and weight concerns:  Has gained weight back over the cooler months.  States with work and the cold outside, has not been exercises.   Was walking with a neighbor friend, but they just sort of stopped with time.  She often walked by herself.   States she is still eating healthy. Just feels fatigued a lot and not physically active now.  Did feel better when was more active.  3.  Right posterior leg pain at level of knee.  Past 4 days.  Does not stretch before walking.  No swelling or redness of leg.  Feels like something is stretching the tissue behind her knee.    4.  Getting muscle cramps at times.  Does drink water during the day--4-5 water bottles.  Had a CMP in July of 2016 with minimally low Calcium.  Has not had CBC or TSH recently  Medications Allegra 180 mg daily as needed  Allergies:   NKDA Review of Systems     Objective:   Physical Exam  NAD HEENT:  No conjunctival injection, PERRL, EOMI, TMs pearly gray, throat without injection, nasal mucosa without swelling/inflammation Neck:  Supple, no adenopathy, no thyromegaly  Chest:  CTA CV:  RRR without murmur or rub, radial pulses normal and equal Abd:  S, NT, No HSM or masses, +BS Right Knee:  No palpable effusion, full ROM, Nontender over patella, NT over joint lines.  No laxity or pain on cruciate and collateral ligament stress maneuvers. Tender over tendons of posterior knee (R) to insertion (both medial and lateral tendons      Assessment & Plan:  1.  Allergies:  Improved with Allegra  2.  Elevated liver enzymes with hepatic steatosis by ultrasound 08/2014.  Have improved with time.  Check to see if ever received records from [facility] regarding Hepatitis evaluation in past.  Was told to go to [facility] for Hep B  vaccination when patient at [facility].  If unable to find, check viral Hepatitis panel on return.  3.  Hepatic steatosis:  Needs to lose weight.  Encouraged patient to find alternative physical activity when does not want to be outside.  4. Right knee:  Appears to be a mild tendinitis:  Stretches given, before and after walking.  5.  Leg cramps:  Check TSH, CMP with recent mildly low Calcium.  Does have history also of low Vitamin D on chart review, but resolved with replacement.

## 2015-02-25 NOTE — Patient Instructions (Signed)
Do leg stretches 2 times daily--count to 20 as you stretch the back of your legs--count to 20 each time.

## 2015-02-26 LAB — CBC WITH DIFFERENTIAL/PLATELET
BASOS ABS: 0 10*3/uL (ref 0.0–0.2)
BASOS: 0 %
EOS (ABSOLUTE): 0.1 10*3/uL (ref 0.0–0.4)
Eos: 1 %
Hematocrit: 39.7 % (ref 34.0–46.6)
Hemoglobin: 13.7 g/dL (ref 11.1–15.9)
IMMATURE GRANS (ABS): 0 10*3/uL (ref 0.0–0.1)
Immature Granulocytes: 0 %
LYMPHS ABS: 2.8 10*3/uL (ref 0.7–3.1)
LYMPHS: 30 %
MCH: 30.9 pg (ref 26.6–33.0)
MCHC: 34.5 g/dL (ref 31.5–35.7)
MCV: 89 fL (ref 79–97)
MONOS ABS: 0.7 10*3/uL (ref 0.1–0.9)
Monocytes: 7 %
NEUTROS ABS: 5.8 10*3/uL (ref 1.4–7.0)
Neutrophils: 62 %
Platelets: 232 10*3/uL (ref 150–379)
RBC: 4.44 x10E6/uL (ref 3.77–5.28)
RDW: 13.3 % (ref 12.3–15.4)
WBC: 9.4 10*3/uL (ref 3.4–10.8)

## 2015-02-26 LAB — COMPREHENSIVE METABOLIC PANEL
ALT: 36 IU/L — AB (ref 0–32)
AST: 17 IU/L (ref 0–40)
Albumin/Globulin Ratio: 1.7 (ref 1.1–2.5)
Albumin: 4.4 g/dL (ref 3.5–5.5)
Alkaline Phosphatase: 84 IU/L (ref 39–117)
BILIRUBIN TOTAL: 0.3 mg/dL (ref 0.0–1.2)
BUN / CREAT RATIO: 18 (ref 9–23)
BUN: 12 mg/dL (ref 6–24)
CALCIUM: 9 mg/dL (ref 8.7–10.2)
CHLORIDE: 100 mmol/L (ref 96–106)
CO2: 24 mmol/L (ref 18–29)
Creatinine, Ser: 0.65 mg/dL (ref 0.57–1.00)
GFR calc Af Amer: 126 mL/min/{1.73_m2} (ref >59)
GFR, EST NON AFRICAN AMERICAN: 109 mL/min/{1.73_m2} (ref >59)
Globulin, Total: 2.6 g/dL (ref 1.5–4.5)
Glucose: 83 mg/dL (ref 65–99)
POTASSIUM: 4.2 mmol/L (ref 3.5–5.2)
Sodium: 140 mmol/L (ref 134–144)
TOTAL PROTEIN: 7 g/dL (ref 6.0–8.5)

## 2015-02-26 LAB — TSH: TSH: 0.738 u[IU]/mL (ref 0.450–4.500)

## 2015-03-16 ENCOUNTER — Encounter: Payer: Self-pay | Admitting: Internal Medicine

## 2015-03-16 DIAGNOSIS — R748 Abnormal levels of other serum enzymes: Secondary | ICD-10-CM | POA: Insufficient documentation

## 2015-03-30 ENCOUNTER — Other Ambulatory Visit: Payer: Self-pay | Admitting: Internal Medicine

## 2015-03-30 DIAGNOSIS — R921 Mammographic calcification found on diagnostic imaging of breast: Secondary | ICD-10-CM

## 2015-04-18 DIAGNOSIS — C50919 Malignant neoplasm of unspecified site of unspecified female breast: Secondary | ICD-10-CM | POA: Insufficient documentation

## 2015-04-18 HISTORY — DX: Malignant neoplasm of unspecified site of unspecified female breast: C50.919

## 2015-05-04 ENCOUNTER — Other Ambulatory Visit: Payer: No Typology Code available for payment source

## 2015-05-05 ENCOUNTER — Ambulatory Visit
Admission: RE | Admit: 2015-05-05 | Discharge: 2015-05-05 | Disposition: A | Discharge status: Home / Self Care | Payer: No Typology Code available for payment source | Source: Ambulatory Visit | Attending: Internal Medicine | Admitting: Internal Medicine

## 2015-05-05 ENCOUNTER — Other Ambulatory Visit: Payer: Self-pay | Admitting: Internal Medicine

## 2015-05-05 DIAGNOSIS — R921 Mammographic calcification found on diagnostic imaging of breast: Secondary | ICD-10-CM

## 2015-05-05 DIAGNOSIS — N6310 Unspecified lump in the right breast, unspecified quadrant: Secondary | ICD-10-CM

## 2015-05-07 ENCOUNTER — Ambulatory Visit (HOSPITAL_COMMUNITY)
Admission: RE | Admit: 2015-05-07 | Discharge: 2015-05-07 | Disposition: A | Discharge status: Home / Self Care | Payer: Self-pay | Source: Ambulatory Visit | Attending: Obstetrics and Gynecology | Admitting: Obstetrics and Gynecology

## 2015-05-07 ENCOUNTER — Encounter (HOSPITAL_COMMUNITY): Payer: Self-pay

## 2015-05-07 VITALS — BP 110/68 | Temp 99.3°F | Ht 61.0 in | Wt 169.6 lb

## 2015-05-07 DIAGNOSIS — Z1239 Encounter for other screening for malignant neoplasm of breast: Secondary | ICD-10-CM

## 2015-05-07 NOTE — Progress Notes (Signed)
Complaints of some breast soreness since mammogram on 05/05/2015.  Pap Smear: Pap smear not completed today. Last Pap smear was around June 2016 at the [facility] and normal per patient. Per patient has no history of an abnormal Pap smear. No Pap smear results are in EPIC.  Physical exam: Breasts Breasts symmetrical. No skin abnormalities bilateral breasts. No nipple retraction bilateral breasts. No nipple discharge bilateral breasts. No lymphadenopathy. No lumps palpated bilateral breasts. Complaints of left outer breast soreness on exam. Referred patient to the [facility] for a right breast  biopsy per recommendation. Appointment scheduled for Thursday, May 14, 2015 at 0900.  Pelvic/Bimanual No Pap smear completed today since last Pap smear was in June 2016 per patient. Pap smear not indicated per BCCCP guidelines.   Smoking History: Patient has never smoked.  Patient Navigation: Patient education provided. Access to services provided for patient through [facility] program. Spanish interpreter provided.   Used Spanish interpreter Cresenciano Genre at [facility]

## 2015-05-07 NOTE — Patient Instructions (Addendum)
Educational materials on self breast awareness given. Explained to Nicole Holloway that she did not need a Pap smear today due to last Pap smear was around June 2016 per patient. Let her know BCCCP will cover Pap smears every 3 years unless has a history of abnormal Pap smears. Referred patient to the [place] [facility] for a right breast  biopsy per recommendation. Appointment scheduled for Thursday, May 14, 2015 at 0900. Patient aware of appointment and will be there. [name] verbalized understanding.  Lizeth Bencosme, Arvil Chaco, RN 5:01 PM

## 2015-05-11 ENCOUNTER — Other Ambulatory Visit: Payer: No Typology Code available for payment source

## 2015-05-12 ENCOUNTER — Encounter (HOSPITAL_COMMUNITY): Payer: Self-pay | Admitting: *Deleted

## 2015-05-14 ENCOUNTER — Ambulatory Visit
Admission: RE | Admit: 2015-05-14 | Discharge: 2015-05-14 | Disposition: A | Discharge status: Home / Self Care | Payer: No Typology Code available for payment source | Source: Ambulatory Visit | Attending: Internal Medicine | Admitting: Internal Medicine

## 2015-05-14 ENCOUNTER — Other Ambulatory Visit: Payer: Self-pay | Admitting: Internal Medicine

## 2015-05-14 ENCOUNTER — Encounter: Payer: Self-pay | Admitting: Internal Medicine

## 2015-05-14 DIAGNOSIS — N6310 Unspecified lump in the right breast, unspecified quadrant: Secondary | ICD-10-CM

## 2015-05-14 DIAGNOSIS — R921 Mammographic calcification found on diagnostic imaging of breast: Secondary | ICD-10-CM

## 2015-05-14 HISTORY — PX: BREAST BIOPSY: SHX20

## 2015-05-19 ENCOUNTER — Ambulatory Visit (INDEPENDENT_AMBULATORY_CARE_PROVIDER_SITE_OTHER): Payer: Self-pay | Admitting: Internal Medicine

## 2015-05-19 ENCOUNTER — Encounter: Payer: Self-pay | Admitting: Internal Medicine

## 2015-05-19 VITALS — BP 110/70 | HR 80 | Resp 18 | Ht 62.0 in | Wt 167.0 lb

## 2015-05-19 DIAGNOSIS — M545 Low back pain, unspecified: Secondary | ICD-10-CM

## 2015-05-19 DIAGNOSIS — T148 Other injury of unspecified body region: Secondary | ICD-10-CM

## 2015-05-19 DIAGNOSIS — W57XXXA Bitten or stung by nonvenomous insect and other nonvenomous arthropods, initial encounter: Secondary | ICD-10-CM

## 2015-05-19 NOTE — Patient Instructions (Addendum)
[place]:  [phone] Financial Assistance--Habla con Festus Holts.  Call in 1 month if you do not hear about xray for your back  Hydrocortisone 1% crema dos a tres veces al dia

## 2015-05-19 NOTE — Progress Notes (Signed)
   Subjective:    Patient ID: Nicole Holloway, female    DOB: 1970/09/11, 45 y.o.   MRN: [id number]  HPI   Pt. 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  Has follow up with what sounds like a surgeon on May 10th to discuss next steps.  ER and PR +  Here as she started having right low back pain 2 days ago.  Does not recall any injury or overuse.  Was getting breast biopsies on Thursday and Friday, pain beginning on Sunday.  Pt. Usually involved with Zumba, but did not go end of week as she had the appt.s for breast biopsies.   Describes pain as sharp and constant, but much worse if bends over.  No radiation down legs.  No numbness tingling or weakness down leg, but has noted in past, the right leg falls asleep if she sits for a long period of time. Has not taken anything for the pain.  Mosquito bites on arms an legs--itchy and red.  Did not use mosquito repellant.   Current outpatient prescriptions:  .  fexofenadine (ALLEGRA) 180 MG tablet, Take 1 tablet (180 mg total) by mouth daily. As needed for allergy symptoms, Disp: , Rfl:    No Known Allergies      Review of Systems     Objective:   Physical Exam NAD Wearing very high platform heels. Tender over spinous process of mid thoracic region and high lumbar region as well. Tender over right paraspinous musculature.   Negative straight leg raise.   Neuro:  Motor 5/5, DTRs 2+/5 of bilateral LE Skin:  Lower arms and hands and one on right leg:  Raised red macular lesion with central raised center area.       Assessment & Plan:  1.  Low back Pain:  No definite history that this may have been from lying for long periods during her breast biopsies last week.   Discussed avoiding high heels. Ibuprofen 200 mg 2-4 tabs every 6 hours as needed for pain Rest. Will order Xray of Lumbar spine, but not sure if will be able to obtain quickly. She does have an appt.on  May 10 th with group at Oncology for next steps in her care for breast cancer--discussed with [facility].  Gave her number to call Financial Assistance associated with that care and called to let them know a Spanish speaking person would be calling.  2.  Mosquito bites:  Local care with cold compresses and hydrocortisone cream.

## 2015-05-20 ENCOUNTER — Other Ambulatory Visit: Payer: Self-pay | Admitting: *Deleted

## 2015-05-20 ENCOUNTER — Telehealth: Payer: Self-pay | Admitting: *Deleted

## 2015-05-20 DIAGNOSIS — C50411 Malignant neoplasm of upper-outer quadrant of right female breast: Secondary | ICD-10-CM

## 2015-05-20 NOTE — Telephone Encounter (Signed)
Almyra Free the interpreter called and confirmed patient appointment for [facility] 05/27/15 at 815am. Contact information given.

## 2015-05-27 ENCOUNTER — Ambulatory Visit: Payer: No Typology Code available for payment source | Attending: Surgery | Admitting: Physical Therapy

## 2015-05-27 ENCOUNTER — Ambulatory Visit
Admission: RE | Admit: 2015-05-27 | Discharge: 2015-05-27 | Disposition: A | Discharge status: Home / Self Care | Payer: Self-pay | Source: Ambulatory Visit | Attending: Radiation Oncology | Admitting: Radiation Oncology

## 2015-05-27 ENCOUNTER — Encounter: Payer: Self-pay | Admitting: Hematology and Oncology

## 2015-05-27 ENCOUNTER — Encounter: Payer: Self-pay | Admitting: *Deleted

## 2015-05-27 ENCOUNTER — Ambulatory Visit (HOSPITAL_BASED_OUTPATIENT_CLINIC_OR_DEPARTMENT_OTHER): Payer: Self-pay | Admitting: Hematology and Oncology

## 2015-05-27 ENCOUNTER — Other Ambulatory Visit (HOSPITAL_BASED_OUTPATIENT_CLINIC_OR_DEPARTMENT_OTHER): Payer: Self-pay

## 2015-05-27 ENCOUNTER — Encounter: Payer: Self-pay | Admitting: Physical Therapy

## 2015-05-27 ENCOUNTER — Ambulatory Visit: Payer: Self-pay | Admitting: Surgery

## 2015-05-27 VITALS — BP 117/65 | HR 73 | Temp 98.5°F | Resp 18 | Ht 62.0 in | Wt 169.1 lb

## 2015-05-27 DIAGNOSIS — M25511 Pain in right shoulder: Secondary | ICD-10-CM | POA: Insufficient documentation

## 2015-05-27 DIAGNOSIS — Z170 Estrogen receptor positive status [ER+]: Secondary | ICD-10-CM

## 2015-05-27 DIAGNOSIS — C50411 Malignant neoplasm of upper-outer quadrant of right female breast: Secondary | ICD-10-CM

## 2015-05-27 DIAGNOSIS — C50211 Malignant neoplasm of upper-inner quadrant of right female breast: Secondary | ICD-10-CM

## 2015-05-27 LAB — COMPREHENSIVE METABOLIC PANEL
ALT: 27 U/L (ref 0–55)
AST: 15 U/L (ref 5–34)
Albumin: 3.8 g/dL (ref 3.5–5.0)
Alkaline Phosphatase: 67 U/L (ref 40–150)
Anion Gap: 7 mEq/L (ref 3–11)
BUN: 13.5 mg/dL (ref 7.0–26.0)
CALCIUM: 9.2 mg/dL (ref 8.4–10.4)
CHLORIDE: 106 meq/L (ref 98–109)
CO2: 27 meq/L (ref 22–29)
Creatinine: 0.8 mg/dL (ref 0.6–1.1)
EGFR: >90 mL/min/{1.73_m2} (ref >90)
Glucose: 106 mg/dl (ref 70–140)
Potassium: 3.8 mEq/L (ref 3.5–5.1)
SODIUM: 140 meq/L (ref 136–145)
Total Bilirubin: 0.59 mg/dL (ref 0.20–1.20)
Total Protein: 6.8 g/dL (ref 6.4–8.3)

## 2015-05-27 LAB — CBC WITH DIFFERENTIAL/PLATELET
BASO%: 0.2 % (ref 0.0–2.0)
Basophils Absolute: 0 10*3/uL (ref 0.0–0.1)
EOS%: 1.5 % (ref 0.0–7.0)
Eosinophils Absolute: 0.1 10*3/uL (ref 0.0–0.5)
HEMATOCRIT: 40.4 % (ref 34.8–46.6)
HGB: 13.8 g/dL (ref 11.6–15.9)
LYMPH#: 1.7 10*3/uL (ref 0.9–3.3)
LYMPH%: 28.1 % (ref 14.0–49.7)
MCH: 30.1 pg (ref 25.1–34.0)
MCHC: 34.2 g/dL (ref 31.5–36.0)
MCV: 88 fL (ref 79.5–101.0)
MONO#: 0.5 10*3/uL (ref 0.1–0.9)
MONO%: 7.6 % (ref 0.0–14.0)
NEUT#: 3.7 10*3/uL (ref 1.5–6.5)
NEUT%: 62.6 % (ref 38.4–76.8)
Platelets: 198 10*3/uL (ref 145–400)
RBC: 4.59 10*6/uL (ref 3.70–5.45)
RDW: 12.7 % (ref 11.2–14.5)
WBC: 5.9 10*3/uL (ref 3.9–10.3)

## 2015-05-27 NOTE — Progress Notes (Signed)
[facility] Psychosocial Distress Screening Clinical Social Work  Clinical Social Work met with pt, her husband and son at [facility] to introduce self, review role of Pt and and Family Support Team and to review the distress screening protocol.CSW was aided by Lavon Paganini interpreter to complete assessment. The patient scored a 7 on the Psychosocial Distress Thermometer which indicates severe distress. Pt reports her distress is much less after talking with the medical team and becoming aware of her plan. All of her distress was related to lack of information and she feels her questions were answered today. Pt is considering seeking a second opinion at [facility] and she was told our RN Navigator would assist her with that process. Clinical Social Worker reviewed common emotions, coping techniques and additional resources to assist pt and family as they begin their cancer journey. No other needs noted. Pt agrees to reach out as needed.   ON[facility]N DISTRESS SCREENING 05/27/2015  Screening Type Initial Screening  Distress experienced in past week (1-10) 7  Information Concerns Type Lack of info about diagnosis;Lack of info about treatment;Lack of info about complementary therapy choices  Referral to support programs Yes    Clinical Social Worker follow up needed: No.  If yes, follow up plan:  Loren Racer, [place]  [facility] Phone: [phone] Fax: [phone]

## 2015-05-27 NOTE — Progress Notes (Signed)
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 female who had a 1 year  follow up mammogram for left breast calcifications on 05/05/15. This found an area of distortion in the right breast with a new group of calcifications in the left breast measuring 12 mm. Stable calcifications were also noted in the left breast. Ultrasound showed a 4 x 4 x 6 mm mass in the right breast with no right axillary lymphadenopathy. Biopsy of the UIQ of the left breast on 05/14/15 showed fibrocystic change and adenosis with calcifications; no malignancy. Biopsy of the right breast revealed grade 1 invasive ductal carcinoma with DCIS (ER/PR positive, HER2 negative (but there was minimal invasive tumor present for HER2 evaluation), Ki67 3%. The patient presents to breast clinic today to discuss the role of radiation in the management of her disease. She is accompanied by her husband and son. An interpretor was present during this encounter (Espanol).  PREVIOUS RADIATION THERAPY: No  Past medical, social and family history were reviewed in the electronic chart. Review of symptoms was reviewed in the electronic chart. Medications were reviewed in the electronic chart.   She has no history of cancer. She has a history of chronic back pain.  PHYSICAL EXAM: Vitals with BMI 05/27/2015  Height '5\' 2"'   Weight 169 lbs 2 oz  BMI 31  Systolic 502  Diastolic 65  Pulse 73  Respirations 89  Pleasant female in no acute distress. Alert and oriented x3. Small breasts bilaterally. She has a healing biopsy site in the UIQ of the left breast. In the right breast in the UOQ, she has a biopsy site with a small amount of  biopsy change underneath.No palpable cervical, supraclavicular, or axillary adenopathy.   IMPRESSION: 45 y.o. female with T1N0 invasive ductal carcinoma of the right breast  PLAN: I spoke to the patient today regarding her diagnosis and options for treatment. We discussed the equivalence in terms of survival and local failure between mastectomy and breast conservation. We discussed the role of radiation in decreasing local failures in patients who undergo lumpectomy. We discussed the process of CT simulation and the placement tattoos. We discussed 4-6 weeks of treatment as an outpatient. We discussed the possibility of asymptomatic lung damage. We discussed the low likelihood of secondary malignancies. We discussed the possible side effects including but not limited to skin redness, fatigue, permanent skin darkening, and breast swelling. I will see her back after her Oncotype score. I did clarify with her that if she needed chemotherapy this would be performed prior to radiation. She met with medical oncology as well as a member of our patient family support team and our physical therapist. I will plan on seeing her back after her surgery.  The patient also qualifies for genetics counseling due to her age. She desires a second opinion at [facility] which we can arrange. .  I spent 40 minutes  face to face with the patient and more than 50% of that time was spent in counseling and/or coordination of care.   ------------------------------------------------  Thea Silversmith, MD  This document serves as a record of services personally performed by Thea Silversmith, MD. It was created on her behalf by  Darcus Austin, a trained medical scribe. The creation of this record is based on the scribe's personal observations and the provider's statements to them. This document has been checked and approved by the attending provider.

## 2015-05-27 NOTE — Patient Instructions (Signed)

## 2015-05-27 NOTE — H&P (Signed)
Nicole Holloway 05/27/2015 8:02 AM Location: [facility] Patient #: [id number] DOB: 06/05/1970 Undefined / Language: English / Race: White Female  History of Present Illness (Thais Silberstein A. Edlyn Rosenburg MD; 05/27/2015 11:19 AM) Patient words: patient is sent at the request of Dr. Wentworth due to right breast mass. 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 She had biopsy of her left breast showed fibrocystic change. Pt complains of right breast pain.  The patient is a 45 year old female.   Other Problems (Wendy Smith, RN; 05/27/2015 8:02 AM) Hemorrhoids  Past Surgical History (Wendy Smith, RN; 05/27/2015 8:02 AM) Breast Biopsy Bilateral. Cesarean Section - 1  Diagnostic Studies History (Wendy Smith, RN; 05/27/2015 8:02 AM) Colonoscopy never Mammogram within last year Pap Smear 1-5 years ago  Medication History (Wendy Smith, RN; 05/27/2015 8:02 AM) Medications Reconciled  Social History (Wendy Smith, RN; 05/27/2015 8:02 AM) Alcohol use Occasional alcohol use. Caffeine use Carbonated beverages, Coffee, Tea. No drug use Tobacco use Never smoker.  Family History (Wendy Smith, RN; 05/27/2015 8:02 AM) Diabetes Mellitus Father. Hypertension Father, Mother.  Pregnancy / Birth History (Wendy Smith, RN; 05/27/2015 8:02 AM) Age at menarche 13 years. Gravida 2 Maternal age 21-25 Para 2 Regular periods     Review of Systems (Wendy Smith RN; 05/27/2015 8:02 AM) General Present- Night Sweats and Weight Gain. Not Present- Appetite Loss, Chills, Fatigue, Fever and Weight Loss. Skin Present- Rash. Not Present- Change in Wart/Mole, Dryness, Hives, Jaundice, New Lesions, Non-Healing Wounds and Ulcer. HEENT Present- Seasonal Allergies. Not Present- Earache, Hearing Loss, Hoarseness, Nose Bleed, Oral Ulcers, Ringing in the Ears, Sinus Pain, Sore Throat, Visual  Disturbances, Wears glasses/contact lenses and Yellow Eyes. Respiratory Not Present- Bloody sputum, Chronic Cough, Difficulty Breathing, Snoring and Wheezing. Breast Not Present- Breast Mass, Breast Pain, Nipple Discharge and Skin Changes. Cardiovascular Not Present- Chest Pain, Difficulty Breathing Lying Down, Leg Cramps, Palpitations, Rapid Heart Rate, Shortness of Breath and Swelling of Extremities. Gastrointestinal Present- Constipation and Hemorrhoids. Not Present- Abdominal Pain, Bloating, Bloody Stool, Change in Bowel Habits, Chronic diarrhea, Difficulty Swallowing, Excessive gas, Gets full quickly at meals, Indigestion, Nausea, Rectal Pain and Vomiting. Female Genitourinary Not Present- Frequency, Nocturia, Painful Urination, Pelvic Pain and Urgency. Musculoskeletal Present- Back Pain. Not Present- Joint Pain, Joint Stiffness, Muscle Pain, Muscle Weakness and Swelling of Extremities. Neurological Not Present- Decreased Memory, Fainting, Headaches, Numbness, Seizures, Tingling, Tremor, Trouble walking and Weakness. Psychiatric Present- Frequent crying. Not Present- Anxiety, Bipolar, Change in Sleep Pattern, Depression and Fearful. Endocrine Not Present- Cold Intolerance, Excessive Hunger, Hair Changes, Heat Intolerance, Hot flashes and New Diabetes. Hematology Present- Gland problems. Not Present- Easy Bruising, Excessive bleeding, HIV and Persistent Infections.   Physical Exam (Nithila Sumners A. Madalina Rosman MD; 05/27/2015 11:20 AM)  General Mental Status-Alert. General Appearance-Consistent with stated age. Hydration-Well hydrated. Voice-Normal.  Head and Neck Head-normocephalic, atraumatic with no lesions or palpable masses. Trachea-midline. Thyroid Gland Characteristics - normal size and consistency.  Chest and Lung Exam Chest and lung exam reveals -quiet, even and easy respiratory effort with no use of accessory muscles and on auscultation, normal breath sounds, no  adventitious sounds and normal vocal resonance. Inspection Chest Wall - Normal. Back - normal.  Breast Breast - Left-Symmetric, Non Tender, No Biopsy scars, no Dimpling, No Inflammation, No Lumpectomy scars, No Mastectomy scars, No Peau d' Orange. Breast - Right-Symmetric, Non Tender, No Biopsy scars, no Dimpling, No Inflammation, No   Lumpectomy scars, No Mastectomy scars, No Peau d' Orange. Breast Lump-No Palpable Breast Mass.  Cardiovascular Cardiovascular examination reveals -normal heart sounds, regular rate and rhythm with no murmurs and normal pedal pulses bilaterally.  Musculoskeletal Normal Exam - Left-Upper Extremity Strength Normal and Lower Extremity Strength Normal. Normal Exam - Right-Upper Extremity Strength Normal and Lower Extremity Strength Normal.  Lymphatic Head & Neck  General Head & Neck Lymphatics: Bilateral - Description - Normal. Axillary  General Axillary Region: Bilateral - Description - Normal. Tenderness - Non Tender.    Assessment & Plan (Aundria Bitterman A. Kenni Newton MD; 05/27/2015 11:22 AM)  BREAST CANCER, RIGHT (C50.911) Impression: Discussed right breast partial mastectomy and right axillary sentinel lymph node mapping. patient desires breast conservation. Discussed mastectomy and reconstruction. Risk of lumpectomy include bleeding, infection, seroma, more surgery, use of seed/wire, wound care, cosmetic deformity and the need for other treatments, death , blood clots, death. Pt agrees to proceed. Risk of sentinel lymph node mapping include bleeding, infection, lymphedema, shoulder pain. stiffness, dye allergy. cosmetic deformity , blood clots, death, need for more surgery. Pt agrees to proceed.  Current Plans Pt Education - [facility] Breast Cancer Information Given - [employer] "Breast Journey" Package We discussed the staging and pathophysiology of breast cancer. We discussed all of the different options for treatment for breast cancer including surgery,  chemotherapy, radiation therapy, Herceptin, and antiestrogen therapy. We discussed a sentinel lymph node biopsy as she does not appear to having lymph node involvement right now. We discussed the performance of that with injection of radioactive tracer and blue dye. We discussed that she would have an incision underneath her axillary hairline. We discussed that there is a bout a 10-20% chance of having a positive node with a sentinel lymph node biopsy and we will await the permanent pathology to make any other first further decisions in terms of her treatment. One of these options might be to return to the operating room to perform an axillary lymph node dissection. We discussed about a 1-2% risk lifetime of chronic shoulder pain as well as lymphedema associated with a sentinel lymph node biopsy. We discussed the options for treatment of the breast cancer which included lumpectomy versus a mastectomy. We discussed the performance of the lumpectomy with a wire placement. We discussed a 10-20% chance of a positive margin requiring reexcision in the operating room. We also discussed that she may need radiation therapy or antiestrogen therapy or both if she undergoes lumpectomy. We discussed the mastectomy and the postoperative care for that as well. We discussed that there is no difference in her survival whether she undergoes lumpectomy with radiation therapy or antiestrogen therapy versus a mastectomy. There is a slight difference in the local recurrence rate being 3-5% with lumpectomy and about 1% with a mastectomy. We discussed the risks of operation including bleeding, infection, possible reoperation. She understands her further therapy will be based on what her stages at the time of her operation.  Pt Education - [facility] breast cancer surgery: discussed with patient and provided information. Pt Education - [facility] Breast Biopsy [facility]: discussed with patient and provided information. Pt Education - [facility] Mastectomy  [facility]CI 

## 2015-05-27 NOTE — Assessment & Plan Note (Signed)
Right breast biopsy 05/14/2015 10:00: IDC with DCIS, grade 1, ER 90%, PR 80%, Ki-67 3%, HER-2 negative ratio 0.93; right breast distortion on mammogram at 10:00 6 x 4 x 4 mm ultrasound axilla negative, T1b N0 stage IA clinical stage  Pathology and radiology counseling:Discussed with the patient, the details of pathology including the type of breast cancer,the clinical staging, the significance of ER, PR and HER-2/neu receptors and the implications for treatment. After reviewing the pathology in detail, we proceeded to discuss the different treatment options between surgery, radiation, chemotherapy, antiestrogen therapies.  Recommendations: 1. Breast conserving surgery followed by 2. Oncotype DX testing to determine if chemotherapy would be of any benefit followed by 3. Adjuvant radiation therapy followed by 4. Adjuvant antiestrogen therapy  Oncotype counseling: I discussed Oncotype DX test. I explained to the patient that this is a 21 gene panel to evaluate patient tumors DNA to calculate recurrence score. This would help determine whether patient has high risk or intermediate risk or low risk breast cancer. She understands that if her tumor was found to be high risk, she would benefit from systemic chemotherapy. If low risk, no need of chemotherapy. If she was found to be intermediate risk, we would need to evaluate the score as well as other risk factors and determine if an abbreviated chemotherapy may be of benefit.  Return to clinic after surgery to discuss final pathology report and then determine if Oncotype DX testing will need to be sent.

## 2015-05-27 NOTE — Progress Notes (Signed)
[facility] CONSULT NOTE  Patient Care Team: Mack Hook, MD as PCP - General (Internal Medicine)  CHIEF COMPLAINTS/PURPOSE OF CONSULTATION:  Newly diagnosed breast cancer  HISTORY OF PRESENTING ILLNESS:  Nicole Holloway 45 y.o. female is here because of recent diagnosis of right breast cancer. Patient had a routine screening mammogram that revealed new left breast calcifications upper inner quadrant which were biopsy-proven to be benign. 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 Ultrasound axilla was negative. She was presented as morning of the multidisciplinary tumor board and she is here to discuss treatment.  I reviewed her records extensively and collaborated the history with the patient.  SUMMARY OF ONCOLOGIC HISTORY:   Breast cancer of upper-outer quadrant of right female breast ([facility])   05/14/2015 Initial Diagnosis Right breast biopsy 10:00: IDC with DCIS, grade 1, ER 90%, PR 80%, Ki-67 3%, HER-2 negative ratio 0.93; right breast distortion on mammogram at 10:00 6 x 4 x 4 mm ultrasound axilla negative, T1b N0 stage IA clinical stage   MEDICAL HISTORY:  Past Medical History  Diagnosis Date  . Abnormal mammogram with microcalcification 07/03/2014    Left Breast calcifications  . Hyperglycemia 06/09/2014    101 fasting;  repeat 95  . Elevated liver enzymes 06/09/2014    AST resolved, ALT almost normal on recheck 07/2014, Abdominal Ultrasound 09/05/2014 with [facility] showed Hepatic steatosis  . Constipation   . External hemorrhoids   . Varicose veins of both lower extremities   . Seasonal allergies   . Hyperlipidemia 06/04/2014  . Hepatic steatosis 09/05/2014    With elevated liver enzymes  . Breast cancer [facility])     SURGICAL HISTORY: Past Surgical History  Procedure Laterality Date   . Cesarean section    . Breast biopsy Right 05/14/2015    SOCIAL HISTORY: Social History   Social History  . Marital Status: Married    Spouse Name: Francisco  . Number of Children: 2  . Years of Education: N/A   Occupational History  . housecleaning    Social History Main Topics  . Smoking status: Never Smoker   . Smokeless tobacco: Never Used  . Alcohol Use: Yes     Comment: Rare  . Drug Use: No  . Sexual Activity: Yes    Birth Control/ Protection: None   Other Topics Concern  . Not on file   Social History Narrative   Originally from [place]   Came to [place] In 2004   LIves at home with husband and two kids.      FAMILY HISTORY: Family History  Problem Relation Age of Onset  . Diabetes Father     ALLERGIES:  has No Known Allergies.  MEDICATIONS:  No current outpatient prescriptions on file.   No current facility-administered medications for this visit.    REVIEW OF SYSTEMS:   Constitutional: Denies fevers, chills or abnormal night sweats Eyes: Denies blurriness of vision, double vision or watery eyes Ears, nose, mouth, throat, and face: Denies mucositis or sore throat Respiratory: Denies cough, dyspnea or wheezes Cardiovascular: Denies palpitation, chest discomfort or lower extremity swelling Gastrointestinal:  Denies nausea, heartburn or change in bowel habits Skin: Denies abnormal skin rashes Lymphatics: Denies new lymphadenopathy or easy bruising Neurological:Denies numbness, tingling or new weaknesses Behavioral/Psych: Mood is stable, no new changes  Breast:  Denies any palpable lumps or discharge All other systems were reviewed with the patient and are negative.  PHYSICAL EXAMINATION: ECOG PERFORMANCE STATUS: 1 - Symptomatic but completely ambulatory  Filed Vitals:   05/27/15 0855  BP: 117/65  Pulse: 73  Temp: 98.5 F (36.9 C)  Resp: 18   Filed Weights   05/27/15 0855  Weight: 169 lb 1.6 oz (76.703 kg)    GENERAL:alert, no distress  and comfortable SKIN: skin color, texture, turgor are normal, no rashes or significant lesions EYES: normal, conjunctiva are pink and non-injected, sclera clear OROPHARYNX:no exudate, no erythema and lips, buccal mucosa, and tongue normal  NECK: supple, thyroid normal size, non-tender, without nodularity LYMPH:  no palpable lymphadenopathy in the cervical, axillary or inguinal LUNGS: clear to auscultation and percussion with normal breathing effort HEART: regular rate & rhythm and no murmurs and no lower extremity edema ABDOMEN:abdomen soft, non-tender and normal bowel sounds Musculoskeletal:no cyanosis of digits and no clubbing  PSYCH: alert & oriented x 3 with fluent speech NEURO: no focal motor/sensory deficits BREAST: No palpable nodules in breast. No palpable axillary or supraclavicular lymphadenopathy (exam performed in the presence of a chaperone)   LABORATORY DATA:  I have reviewed the data as listed Lab Results  Component Value Date   WBC 5.9 05/27/2015   HGB 13.8 05/27/2015   HCT 40.4 05/27/2015   MCV 88.0 05/27/2015   PLT 198 05/27/2015   Lab Results  Component Value Date   NA 140 05/27/2015   K 3.8 05/27/2015   CL 100 02/25/2015   CO2 27 05/27/2015    RADIOGRAPHIC STUDIES: I have personally reviewed the radiological reports and agreed with the findings in the report.  ASSESSMENT AND PLAN:  Breast cancer of upper-outer quadrant of right female breast ([facility]) Right breast biopsy 05/14/2015 10:00: IDC with DCIS, grade 1, ER 90%, PR 80%, Ki-67 3%, HER-2 negative ratio 0.93; right breast distortion on mammogram at 10:00 6 x 4 x 4 mm ultrasound axilla negative, T1b N0 stage IA clinical stage  Pathology and radiology counseling:Discussed with the patient, the details of pathology including the type of breast cancer,the clinical staging, the significance of ER, PR and HER-2/neu receptors and the implications for treatment. After reviewing the pathology in detail, we proceeded  to discuss the different treatment options between surgery, radiation, chemotherapy, antiestrogen therapies.  Recommendations: 1. Breast conserving surgery followed by 2. Oncotype DX testing to determine if chemotherapy would be of any benefit followed by 3. Adjuvant radiation therapy followed by 4. Adjuvant antiestrogen therapy  Oncotype counseling: I discussed Oncotype DX test. I explained to the patient that this is a 21 gene panel to evaluate patient tumors DNA to calculate recurrence score. This would help determine whether patient has high risk or intermediate risk or low risk breast cancer. She understands that if her tumor was found to be high risk, she would benefit from systemic chemotherapy. If low risk, no need of chemotherapy. If she was found to be intermediate risk, we would need to evaluate the score as well as other risk factors and determine if an abbreviated chemotherapy may be of benefit.  Return to clinic after surgery to discuss final pathology report and then determine if Oncotype DX testing will need to be sent.   All questions were answered. The patient knows to call the clinic with any problems, questions or concerns.    Rulon Eisenmenger, MD 05/27/2015

## 2015-05-27 NOTE — Progress Notes (Signed)
Note created by Dr. Gudena during office visit, copy to patient,original to scan. 

## 2015-05-27 NOTE — Therapy (Signed)
[place], [place], [address] Phone: [phone]   Fax:  [phone]  Physical Therapy Evaluation  Patient Details  Name: Nicole Holloway MRN: [id number] Date of Birth: 03-27-1970 Referring Provider: Dr. Erroll Luna  Encounter Date: 05/27/2015      PT End of Session - 05/27/15 1334    Visit Number 1   Number of Visits 1   PT Start Time 1022   PT Stop Time 1045   PT Time Calculation (min) 23 min   Activity Tolerance Patient tolerated treatment well   Behavior During Therapy [facility] for tasks assessed/performed      Past Medical History  Diagnosis Date  . Abnormal mammogram with microcalcification 07/03/2014    Left Breast calcifications  . Hyperglycemia 06/09/2014    101 fasting;  repeat 95  . Elevated liver enzymes 06/09/2014    AST resolved, ALT almost normal on recheck 07/2014, Abdominal Ultrasound 09/05/2014 with [facility] showed Hepatic steatosis  . Constipation   . External hemorrhoids   . Varicose veins of both lower extremities   . Seasonal allergies   . Hyperlipidemia 06/04/2014  . Hepatic steatosis 09/05/2014    With elevated liver enzymes  . Breast cancer [facility])     Past Surgical History  Procedure Laterality Date  . Cesarean section    . Breast biopsy Right 05/14/2015    There were no vitals filed for this visit.       Subjective Assessment - 05/27/15 1203    Subjective Patient reports she is here today to be seen by her medical team for her newly diagnosed right breast cancer.   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  It measures 6 mm and is located in the upper outer quadrant.  It is ER/PR positive and HER2 negative with a Ki67 of 3%.   Patient Stated Goals Reduce lymphedema risk and learn post op shoulder ROM HEP   Currently in Pain? Yes   Pain Score 7    Pain Location  Shoulder  upper trap area   Pain Orientation Right   Pain Descriptors / Indicators Tiring;Tightness   Pain Type Acute pain   Pain Onset In the past 7 days   Pain Frequency Intermittent   Aggravating Factors  stress   Pain Relieving Factors unknown   Multiple Pain Sites No            [facility] PT Assessment - 05/27/15 0001    Assessment   Medical Diagnosis Right breast cancer    Referring Provider Dr. Marcello Moores [place]   Hand Dominance Right   Prior Therapy none   Precautions   Precautions Other (comment)   Precaution Comments active breast cancer  Spanish speaking only   Restrictions   Weight Bearing Restrictions No   Balance Screen   Has the patient fallen in the past 6 months No   Has the patient had a decrease in activity level because of a fear of falling?  No   Is the patient reluctant to leave their home because of a fear of falling?  No   Home Environment   Living Environment Private residence   Living Arrangements Spouse/significant other;Children  Pt lives with husband, 39 y.o. son and 11 y.o. daughter   Available Help at Discharge Family   Prior Function   Level of [place] Unemployed   Leisure She does zumba,  runs, and walks daily for 30 minutes   Cognition   Overall Cognitive Status Within Functional Limits for tasks assessed   Posture/Postural Control   Posture/Postural Control No significant limitations   ROM / Strength   AROM / PROM / Strength AROM;Strength   AROM   AROM Assessment Site Shoulder   Right/Left Shoulder Right;Left   Right Shoulder Extension 43 Degrees   Right Shoulder Flexion 153 Degrees   Right Shoulder ABduction 162 Degrees   Right Shoulder Internal Rotation 40 Degrees  Reports of pain   Right Shoulder External Rotation 80 Degrees   Left Shoulder Extension 43 Degrees   Left Shoulder Flexion 149 Degrees   Left Shoulder ABduction 151 Degrees   Left Shoulder Internal Rotation 58 Degrees   Left Shoulder  External Rotation 82 Degrees   Strength   Overall Strength Within functional limits for tasks performed   Palpation   Palpation comment Palpable tightness and reports of tenderness present in right upper trapezius region and right intrascapular region           LYMPHEDEMA/ONCOLOGY QUESTIONNAIRE - 05/27/15 1331    Type   Cancer Type Right breast cancer   Lymphedema Assessments   Lymphedema Assessments Upper extremities   Right Upper Extremity Lymphedema   10 cm Proximal to Olecranon Process 31.6 cm   Olecranon Process 25.5 cm   10 cm Proximal to Ulnar Styloid Process 24.3 cm   Just Proximal to Ulnar Styloid Process 17.2 cm   Across Hand at [employer] 19.4 cm   At [place] of 2nd Digit 6.8 cm   Left Upper Extremity Lymphedema   10 cm Proximal to Olecranon Process 29.8 cm   Olecranon Process 25.6 cm   10 cm Proximal to Ulnar Styloid Process 23.3 cm   Just Proximal to Ulnar Styloid Process 17 cm   Across Hand at [employer] 19.3 cm   At [place] of 2nd Digit 6.3 cm        Patient was instructed today in a home exercise program today for post op shoulder range of motion. These included active assist shoulder flexion in sitting, scapular retraction, wall walking with shoulder abduction, and hands behind head external rotation.  She was encouraged to do these twice a day, holding 3 seconds and repeating 5 times when permitted by her physician.            PT Education - 05/27/15 1333    Education provided Yes   Education Details Lymphedema risk reduction and post op shoulder ROM HEP   Person(s) Educated Patient;Spouse;Child(ren)   Methods Explanation;Demonstration;Handout   Comprehension Verbalized understanding;Returned demonstration              Breast Clinic Goals - 05/27/15 1338    Patient will be able to verbalize understanding of pertinent lymphedema risk reduction practices relevant to her diagnosis specifically related to skin care.   Baseline No knowledge    Time 1   Period Days   Status Achieved   Patient will be able to return demonstrate and/or verbalize understanding of the post-op home exercise program related to regaining shoulder range of motion.   Baseline No knowledge   Time 1   Period Days   Status Achieved   Patient will be able to verbalize understanding of the importance of attending the postoperative After Breast Cancer Class for further lymphedema risk reduction education and therapeutic exercise.   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  It measures 6 mm and is located in the upper outer quadrant.  It is ER/PR positive and HER2 negative with a Ki67 of 3%.  Her medical team met prior to her assessments and a recommended treatment plan was determined based on her pathology and cancer characteristics. She is planning to have a right lumpectomy with a sentinel node biopsy folowed by Oncotype testing, radiation, and anti-estrogen therapy.  She may benefit from post op PT if her right upper trap pain continues and if she has difficulty regaining shoulder ROM post-operatively.  Due to her lack of comorbidities and her evolving medical situation with not having yet had surgery, this evaluation was a low complexity eval.   Rehab Potential Excellent   Clinical Impairments Affecting Rehab Potential Spanish speaking and will need an interpreter   PT Frequency One time visit   PT Treatment/Interventions Therapeutic exercise;Patient/family education   PT Next Visit Plan Will f/u after surgery   PT Home Exercise Plan Post op shoulder ROM HEP   Consulted and Agree with Plan of Care Patient;Family member/caregiver   Family Member Consulted Husband and son      Patient will benefit from skilled therapeutic intervention in order to improve the following deficits and  impairments:  Decreased strength, Pain, Impaired UE functional use, Decreased knowledge of precautions, Decreased range of motion  Visit Diagnosis: Carcinoma of upper-outer quadrant of right female breast ([place]) - Plan: PT plan of care cert/re-cert  Pain in right shoulder - Plan: PT plan of care cert/re-cert   Patient will follow up at outpatient cancer rehab if needed following surgery.  If the patient requires physical therapy at that time, a specific plan will be dictated and sent to the referring physician for approval. The patient was educated today on appropriate basic range of motion exercises to begin post operatively and the importance of attending the After Breast Cancer class following surgery.  Patient was educated today on lymphedema risk reduction practices as it pertains to recommendations that will benefit the patient immediately following surgery.  She verbalized good understanding.  No additional physical therapy is indicated at this time.     Problem List Patient Active Problem List   Diagnosis Date Noted  . Breast cancer of upper-outer quadrant of right female breast ([place]) 05/20/2015  . Elevated liver enzymes 03/16/2015  . Constipation   . External hemorrhoids   . Varicose veins of both lower extremities   . Seasonal allergies   . Hepatic steatosis 09/05/2014  . Hyperlipidemia 06/04/2014  . DUB (dysfunctional uterine bleeding) 11/18/2013    Annia Friendly, PT 05/27/2015 1:44 PM  [place], [place], [address] Phone: [phone]   Fax:  [phone]  Name: Nicole Holloway MRN: [id number] Date of Birth: Feb 21, 1970

## 2015-05-28 ENCOUNTER — Other Ambulatory Visit: Payer: Self-pay

## 2015-05-28 ENCOUNTER — Encounter: Payer: Self-pay | Admitting: Genetic Counselor

## 2015-05-28 ENCOUNTER — Encounter: Payer: Self-pay | Admitting: Internal Medicine

## 2015-05-28 ENCOUNTER — Ambulatory Visit (INDEPENDENT_AMBULATORY_CARE_PROVIDER_SITE_OTHER): Payer: Self-pay | Admitting: Internal Medicine

## 2015-05-28 ENCOUNTER — Ambulatory Visit (HOSPITAL_BASED_OUTPATIENT_CLINIC_OR_DEPARTMENT_OTHER): Payer: Self-pay | Admitting: Genetic Counselor

## 2015-05-28 VITALS — BP 110/68 | HR 76 | Resp 17 | Ht 62.0 in | Wt 169.0 lb

## 2015-05-28 DIAGNOSIS — C50411 Malignant neoplasm of upper-outer quadrant of right female breast: Secondary | ICD-10-CM

## 2015-05-28 DIAGNOSIS — R748 Abnormal levels of other serum enzymes: Secondary | ICD-10-CM

## 2015-05-28 DIAGNOSIS — L709 Acne, unspecified: Secondary | ICD-10-CM

## 2015-05-28 DIAGNOSIS — Z170 Estrogen receptor positive status [ER+]: Secondary | ICD-10-CM

## 2015-05-28 NOTE — Progress Notes (Signed)
REFERRING PROVIDER: GUDENA, VINAY, MD  PRIMARY PROVIDER:  MULBERRY,ELIZABETH, MD  PRIMARY REASON FOR VISIT:  1. Breast cancer of upper-outer quadrant of right female breast (HCC)     HISTORY OF PRESENT ILLNESS:   Nicole Holloway, a 45 y.o. female, was seen for a [facility] cancer genetics consultation at the request of Dr. Gudena due to a personal history of breast cancer at 45.  Nicole Holloway presents to clinic today with Julie who is working as her interpreter to discuss the possibility of a hereditary predisposition to cancer, genetic testing, and to further clarify her future cancer risks, as well as potential cancer risks for family members.   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  Hormone receptor status was ER/PR+, Her2-. Surgical and treatment decisions will be informed by genetic test results.    CANCER HISTORY:    Breast cancer of upper-outer quadrant of right female breast (HCC)   05/14/2015 Initial Diagnosis Right breast biopsy 10:00: IDC with DCIS, grade 1, ER 90%, PR 80%, Ki-67 3%, HER-2 negative ratio 0.93; right breast distortion on mammogram at 10:00 6 x 4 x 4 mm ultrasound axilla negative, T1b N0 stage IA clinical stage     HORMONAL RISK FACTORS:  Menarche was at age 12-13.  First live birth at age 25.  OCP use for approximately 8 years.  Ovaries intact: yes.  Hysterectomy: no.  Menopausal status: premenopausal.  HRT use: 0 years. Colonoscopy: no; not examined. Mammogram within the last year: this was her first mammogram. Number of breast biopsies: 2. Up to date with pelvic exams:  yes. Any excessive radiation exposure in the past:  no, but does report some history of secondhand smoke exposure  Past Medical History  Diagnosis Date  . Abnormal mammogram with microcalcification 07/03/2014    Left Breast calcifications  . Hyperglycemia 06/09/2014    101 fasting;  repeat 95   . Elevated liver enzymes 06/09/2014    AST resolved, ALT almost normal on recheck 07/2014, Abdominal Ultrasound 09/05/2014 with [facility] showed Hepatic steatosis  . Constipation   . External hemorrhoids   . Varicose veins of both lower extremities   . Seasonal allergies   . Hyperlipidemia 06/04/2014  . Hepatic steatosis 09/05/2014    With elevated liver enzymes  . Breast cancer (HCC)     Past Surgical History  Procedure Laterality Date  . Cesarean section    . Breast biopsy Right 05/14/2015    Social History   Social History  . Marital Status: Married    Spouse Name: Francisco  . Number of Children: 2  . Years of Education: N/A   Occupational History  . housecleaning    Social History Main Topics  . Smoking status: Never Smoker   . Smokeless tobacco: Never Used  . Alcohol Use: Yes     Comment: Rare  . Drug Use: No  . Sexual Activity: Yes    Birth Control/ Protection: None   Other Topics Concern  . Not on file   Social History Narrative   Originally from [place]   [place]ame [place]   LIves at home with husband and two kids.       FAMILY HISTORY:  We obtained a detailed, 4-generation family history.  Significant diagnoses are listed below: Family History  Problem Relation Age of Onset  . Diabetes Father   . Other Father     prostate   issues  . Other Sister     cervical issues - maybe dysplasia  . Other Maternal Aunt     hx benign breast lump  . Kidney failure Maternal Grandmother     d. 22s; diabetes  . Diabetes Maternal Grandmother     Nicole Holloway has no known family history of cancer.  She has one son who is 60 and one daughter who is 63.  She has two full sisters and four full brothers, all ages 68-50.  One of her sisters has a history of what sounds like might have been a cervical dysplasia.  Ms. Elayne Snare mother is 29; her father is also 47 and has a history of diabetes and "prostate issues".    Ms. Elayne Snare mother has three full brothers  and five full sisters.  Ms. Elayne Snare mother is the oldest and the other siblings are ages 69-43 to up to 49.  One aunt has a history of a benign breast lumpectomy.  Ms. Elayne Snare maternal grandmother died of kidney failure due to diabetes in her 59s.  Her maternal grandfather is currently 40.  She has no information for her maternal great aunts/uncles and great grandparents.    Ms. Elayne Snare father has two full brothers, one of whom died of an unspecified cause.  He also has three paternal half-brothers and one paternal half-sister.  One half-brother died in a car accident.  The other three half-siblings are alive and currently ages 79-50.  Nicole Holloway's paternal grandmother died of a "bone illness" between age 20-65.  She has no information for her grandfather whom she never met.  She also has no information for her paternal great aunts/uncles and great grandparents.      Patient's maternal and paternal ancestors are of [place] descent. There is no reported Ashkenazi Jewish ancestry. There is no known consanguinity.  GENETIC COUNSELING ASSESSMENT: [name] is a 45 y.o. female with a personal history of early-onset breast cancer which somewhat suggestive of a hereditary breast cancer syndrome and predisposition to cancer. We, therefore, discussed and recommended the following at today's visit.   DISCUSSION: We reviewed the characteristics, features and inheritance patterns of hereditary cancer syndromes, particularly those caused by mutations within the BRCA1/2 genes. We also discussed genetic testing, including the appropriate family members to test, the process of testing, insurance coverage and turn-around-time for results. We discussed the implications of a negative, positive and/or variant of uncertain significant result. We recommended Nicole Holloway pursue genetic testing for the 28-gene [facility] Hereditary Cancer Panel through [employer] ([place]).  The [facility] gene panel offered by [employer] includes sequencing and deletion/duplication testing of the following 25 genes: APC, ATM, BARD1, BMPR1A, BRCA1, BRCA2, BRIP1, CHD1, CDK4, CDKN2A, CHEK2, EPCAM (large rearrangement only), GREM1, MLH1, MSH2, MSH6, MUTYH, NBN, PALB2, PMS2, POLD1, POLE, PTEN, RAD51C, RAD51D, SMAD4, STK11, and TP53.   Based on Nicole Holloway's personal history of cancer, she meets medical criteria for genetic testing. Based on her household size and annual household income and lack of insurance, she also meets [employer] Assistance Program criteria.  Thus, she should have a no cost for testing.    PLAN: After considering the risks, benefits, and limitations, Nicole Holloway  provided informed consent to pursue genetic testing and the blood sample was sent to [employer] for analysis of the 28-gene [facility] Hereditary Cancer Panel. Results should be available within approximately 2 weeks' time, at which point they will be disclosed by telephone to Ms.  Holloway, as will any additional recommendations warranted by these results. Ms. Wilson Singer will receive a summary of her genetic counseling visit and a copy of her results once available. This information will also be available in Epic. We encouraged Nicole Holloway to remain in contact with cancer genetics annually so that we can continuously update the family history and inform her of any changes in cancer genetics and testing that may be of benefit for her family. Ms. Elayne Snare questions were answered to her satisfaction today. Our contact information was provided should additional questions or concerns arise.  Thank you for the referral and allowing [place] to share in the care of your patient.   Jeanine Luz, MS, [facility] Certified Genetic Counselor [email or web address] Phone: [phone]  The patient was seen for a total of 50 minutes in face-to-face genetic  counseling.  This patient was discussed with Drs. Magrinat, Lindi Adie and/or Burr Medico who agrees with the above.    _______________________________________________________________________ For Office Staff:  Number of people involved in session: 2 Was an Intern/ student involved with case: no

## 2015-05-29 LAB — HEPATIC FUNCTION PANEL
ALT: 29 IU/L (ref 0–32)
AST: 21 IU/L (ref 0–40)
Albumin: 4.3 g/dL (ref 3.5–5.5)
Alkaline Phosphatase: 77 IU/L (ref 39–117)
Bilirubin Total: <0.2 mg/dL (ref 0.0–1.2)
Bilirubin, Direct: 0.06 mg/dL (ref 0.00–0.40)
Total Protein: 6.9 g/dL (ref 6.0–8.5)

## 2015-06-01 ENCOUNTER — Other Ambulatory Visit: Payer: Self-pay | Admitting: Surgery

## 2015-06-01 DIAGNOSIS — C50211 Malignant neoplasm of upper-inner quadrant of right female breast: Secondary | ICD-10-CM

## 2015-06-02 ENCOUNTER — Encounter: Payer: Self-pay | Admitting: *Deleted

## 2015-06-02 NOTE — Progress Notes (Signed)
With assistance from Almyra Free ([place] interpreter) discussed [place] from 05/27/15. Relate no questions or concerns regarding dx or treatment care plan. Wants 2nd opinion at [facility] Message sent to HIM to send MR and appt.

## 2015-06-04 ENCOUNTER — Telehealth: Payer: Self-pay | Admitting: Hematology and Oncology

## 2015-06-04 NOTE — Telephone Encounter (Signed)
Received call from navigator re status of [facility] referral - 2nd opinion. Spoke with [employer] @ [facility] [phone]) and per Serita Butcher faxed office note and path report [phone]). Serita Butcher is aware patient has only been seen in our office once for initial consult. Per Serita Butcher after receiving information they will call patient with appointment. Navigator aware.

## 2015-06-05 ENCOUNTER — Telehealth: Payer: Self-pay | Admitting: Internal Medicine

## 2015-06-05 NOTE — Telephone Encounter (Signed)
Patient informed and will give office a call next week if there is no progress with appointment.

## 2015-06-05 NOTE — Telephone Encounter (Signed)
It sounds like they are still actively working on the second opinion.  If that is the case, would give them another week.  If not progressing at that time, to give [place] another call back and we will see if we can't get her in.

## 2015-06-05 NOTE — Telephone Encounter (Signed)
Patient called stating she was seen at the [facility] on 05/04/15 and they detected cancer on her right breast and sent her over to the [facility] at [facility]. Patient was evaluated there too, and requested a second opinion. Patient was told they will set up an appointment in [facility] for the second opinion. Patient has not been scheduled yet and states she went to the [facility] yesterday to get an update about her appointment in [facility] and was told that they have been sending emails to [facility] to set up her appointment, but have not receive any response yet. Patient also states she is schedule to have surgery for it on 06/16/15 but does not want to have it done until she gets a second opinion. Patient wants advise from Dr. Amil Amen on what she needs to do or where she can get a second opinion before having surgery. Please advise

## 2015-06-09 ENCOUNTER — Telehealth: Payer: Self-pay | Admitting: *Deleted

## 2015-06-09 ENCOUNTER — Encounter: Payer: Self-pay | Admitting: *Deleted

## 2015-06-09 NOTE — Telephone Encounter (Signed)
Spoke with Vikki Ports (new pt coordinator) at [facility] concerning appt for 2nd opinion. Was informed they needed more MR and a demographics sheet. MR and demographics sheet was faxed to [phone]. Request [place] call back with appt date and time. Gave contact information.

## 2015-06-10 ENCOUNTER — Telehealth: Payer: Self-pay | Admitting: Genetic Counselor

## 2015-06-10 NOTE — Telephone Encounter (Addendum)
Discussed with Nicole Holloway, through [employer], Magda Paganini [phone]), that her genetic test results were negative for known pathogenic mutations within any of 28 genes on the [facility] Hereditary Cancer Panel that would increase her risk for breast, ovarian, or other cancers.  One uncertain change was found in one copy of the STK11 gene.  We discussed that we treat this just like a negative test result and reviewed why we do that.  Encouraged her to keep her phone number up-to-date with [place], so that we can call and update her if this gets reclassified by the lab in the future.  Discussed that she should continue to follow her doctors' recommendations for future cancer screening.  The women in her close family (daughter, sisters, nieces) can begin annual mammogram screening at the age of 87. Nicole Holloway understands.  She is wondering about a second opinion/second mammogram and whether she should wait on that.  I let her know that I could not advise her on that, but that she should make that decision based on her own comfort level.  I let her know that I would make her care team aware of this negative result and that someone would reach out to her soon about her surgery and treatment plan.

## 2015-06-10 NOTE — Telephone Encounter (Signed)
Spoke to patient today and states her interpreter has been calling [facility] and  was told that they have to wait until MR are fax over to there office so they can set up appointment. Patient's interpreter called the [facility] in [place] couple of times to let them know MR are to be fax over to [facility] so appointment can be schedule. Patient informed on last call that records had been faxed and will contact her with appt. Info.

## 2015-06-12 ENCOUNTER — Ambulatory Visit
Admission: RE | Admit: 2015-06-12 | Discharge: 2015-06-12 | Disposition: A | Discharge status: Home / Self Care | Payer: No Typology Code available for payment source | Source: Ambulatory Visit | Attending: Surgery | Admitting: Surgery

## 2015-06-12 ENCOUNTER — Ambulatory Visit: Payer: Self-pay | Admitting: Genetic Counselor

## 2015-06-12 DIAGNOSIS — C50211 Malignant neoplasm of upper-inner quadrant of right female breast: Secondary | ICD-10-CM

## 2015-06-12 DIAGNOSIS — Z1379 Encounter for other screening for genetic and chromosomal anomalies: Secondary | ICD-10-CM

## 2015-06-12 DIAGNOSIS — C50411 Malignant neoplasm of upper-outer quadrant of right female breast: Secondary | ICD-10-CM

## 2015-06-12 NOTE — Progress Notes (Signed)
GENETIC TEST RESULT  HPI: Nicole Holloway was previously seen in the [facility] clinic due to a personal history of breast cancer at age 45 and concerns regarding a hereditary predisposition to cancer. Please refer to our prior cancer genetics clinic note from May 28, 2015 for more information regarding Nicole Holloway's medical, social and family histories, and our assessment and recommendations, at the time. Ms. Elayne Snare recent genetic test results were disclosed to her through an interpreter, as were recommendations warranted by these results. These results and recommendations are discussed in more detail below.  GENETIC TEST RESULTS: At the time of Nicole Holloway's visit on 05/28/15 we recommended she pursue genetic testing of the 28-gene [facility] Hereditary Cancer Panel through [employer].  The [facility] gene panel offered by [employer] includes sequencing and/or deletion/duplication testing of the following 28 genes: APC, ATM, BARD1, BMPR1A, BRCA1, BRCA2, BRIP1, CHD1, CDK4, CDKN2A, CHEK2, EPCAM (large rearrangement only), GREM1, MLH1, MSH2, MSH6, MUTYH, NBN, PALB2, PMS2, POLD1, POLE, PTEN, RAD51C, RAD51D, SMAD4, STK11, and TP53.  Those results are now back, the report date for which is Jun 08, 2015.  Genetic testing was normal, and did not reveal a deleterious mutation in these genes.  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  The test report will be scanned into EPIC and will be located under the Results Review tab in the Pathology>Molecular Pathology section.   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 At this time, it is unknown if this VUS is associated with an increased risk for cancer or if this is a normal finding. Since this VUS result is uncertain, it cannot help guide screening recommendations, and family members  should not be tested for this VUS to help define their own cancer risks.  Also, we all have variants within our genes that make [place] unique individuals--most of these variants are benign.  Thus, we treat this VUS as a negative result.   With time, we suspect the lab will reclassify this variant and when they do, we will try to re-contact Nicole Holloway to discuss the reclassification further.  We also encouraged Nicole Holloway to contact us in a year or two to obtain an update on the status of this VUS.  We discussed with Nicole Holloway that since the current genetic testing is not perfect, it is possible there may be a gene mutation in one of these genes that current testing cannot detect, but that chance is small. We also discussed, that it is possible that another gene that has not yet been discovered, or that we have not yet tested, is responsible for the cancer diagnoses in the family, and it is, therefore, important to remain in touch with cancer genetics in the future so that we can continue to offer Nicole Holloway the most up to date genetic testing.    CANCER SCREENING RECOMMENDATIONS: While we still do not have an explanation for the personal history of breast cancer, this result is reassuring and indicates that Nicole Holloway likely does not have an increased risk for a future cancer due to a mutation in one of these genes. This normal test also suggests that Nicole Holloway's cancer was most likely not due to an inherited predisposition associated with one of these genes.  Additionally, there is no other known cancer in the family.  Most cancers happen by chance and this negative test  suggests that her cancer falls into this category.  We, therefore, recommended she continue to follow the cancer management and screening guidelines provided by her oncology and primary healthcare providers.   RECOMMENDATIONS FOR FAMILY MEMBERS: Women in this family might be at some increased risk of  developing cancer, over the general population risk, simply due to the family history of cancer. We recommended women in this family have a yearly mammogram beginning at age 39, or 18 years younger than the earliest onset of cancer, an an annual clinical breast exam, and perform monthly breast self-exams.  Thus, Nicole Holloway's sisters, daughter, and nieces can likely begin annual mammogram screening at the age of 22 based on her age of diagnosis at 5.  Women in this family should also have a gynecological exam as recommended by their primary provider. All family members should have a colonoscopy by age 33.  FOLLOW-UP: Lastly, we discussed with Nicole Holloway that cancer genetics is a rapidly advancing field and it is possible that new genetic tests will be appropriate for her and/or her family members in the future. We encouraged her to remain in contact with cancer genetics on an annual basis so we can update her personal and family histories and let her know of advances in cancer genetics that may benefit this family.   Our contact number was provided. Ms. Elayne Snare questions were answered to her satisfaction, and she knows she is welcome to call us at anytime with additional questions or concerns.   Jeanine Luz, MS, [facility] Certified Genetic Counselor [email or web address] Phone: [phone]

## 2015-06-15 NOTE — Progress Notes (Signed)
   Subjective:    Patient ID: Nicole Holloway, female    DOB: [birth year], 45 y.o.   MRN: [id number]  HPI  See OV from 05/19/2015  1.  Right Breast Cancer:  Recent diagnosis.  Has undergone genetic testing.  Wants 2nd opinion and apparently Oncology setting that up with [facility].  She has reportedly been recommended to have Lumpectomy.  2.  Back Pain:  Low back pain has resolved.  No hurts up on shoulder.  Her spouse stated part of her back looks inflamed.  Noted 2 days ago.    3.  Insect Bites:  Healing now.  4.  Elevated ALT--mild, in 02/2015.  Needs to have rechecked.  No current outpatient prescriptions on file.   No Known Allergies       Review of Systems     Objective:   Physical Exam NAD Tiny furuncle upper back.  No significant surrounding erythema. Lungs;  CTA CV:  RRR without murmur or rub.  Radial pulses normal and equal. Abd:  S, NT, No HSM or masses, + BS       Assessment & Plan:  1. Right Breast CA:  Await second opinion from [facility]  2.  Furuncle/pimple:  Warm compress.  3. Elevated SGPT: Recheck  4.  Insect Bites: healing.

## 2015-06-16 ENCOUNTER — Ambulatory Visit
Admission: RE | Admit: 2015-06-16 | Discharge: 2015-06-16 | Disposition: A | Discharge status: Home / Self Care | Payer: No Typology Code available for payment source | Source: Ambulatory Visit | Attending: Surgery | Admitting: Surgery

## 2015-06-16 ENCOUNTER — Ambulatory Visit (HOSPITAL_BASED_OUTPATIENT_CLINIC_OR_DEPARTMENT_OTHER)
Admission: RE | Admit: 2015-06-16 | Discharge: 2015-06-16 | Disposition: A | Discharge status: Home / Self Care | Payer: Self-pay | Source: Ambulatory Visit | Attending: Surgery | Admitting: Surgery

## 2015-06-16 ENCOUNTER — Ambulatory Visit (HOSPITAL_BASED_OUTPATIENT_CLINIC_OR_DEPARTMENT_OTHER): Payer: Self-pay | Admitting: Anesthesiology

## 2015-06-16 ENCOUNTER — Encounter (HOSPITAL_BASED_OUTPATIENT_CLINIC_OR_DEPARTMENT_OTHER)
Admission: RE | Disposition: A | Discharge status: Home / Self Care | Payer: Self-pay | Source: Ambulatory Visit | Attending: Surgery

## 2015-06-16 ENCOUNTER — Encounter (HOSPITAL_BASED_OUTPATIENT_CLINIC_OR_DEPARTMENT_OTHER): Payer: Self-pay | Admitting: Certified Registered"

## 2015-06-16 ENCOUNTER — Encounter: Payer: Self-pay | Admitting: *Deleted

## 2015-06-16 ENCOUNTER — Encounter (HOSPITAL_COMMUNITY)
Admission: RE | Admit: 2015-06-16 | Discharge: 2015-06-16 | Disposition: A | Discharge status: Home / Self Care | Payer: No Typology Code available for payment source | Source: Ambulatory Visit | Attending: Surgery | Admitting: Surgery

## 2015-06-16 DIAGNOSIS — C50211 Malignant neoplasm of upper-inner quadrant of right female breast: Secondary | ICD-10-CM

## 2015-06-16 DIAGNOSIS — C50411 Malignant neoplasm of upper-outer quadrant of right female breast: Secondary | ICD-10-CM | POA: Insufficient documentation

## 2015-06-16 DIAGNOSIS — Z170 Estrogen receptor positive status [ER+]: Secondary | ICD-10-CM | POA: Insufficient documentation

## 2015-06-16 HISTORY — PX: BREAST LUMPECTOMY WITH RADIOACTIVE SEED AND SENTINEL LYMPH NODE BIOPSY: SHX6550

## 2015-06-16 HISTORY — PX: BREAST LUMPECTOMY: SHX2

## 2015-06-16 SURGERY — BREAST LUMPECTOMY WITH RADIOACTIVE SEED AND SENTINEL LYMPH NODE BIOPSY
Anesthesia: General | Site: Breast | Laterality: Right

## 2015-06-16 MED ORDER — LIDOCAINE 2% (20 MG/ML) 5 ML SYRINGE
INTRAMUSCULAR | Status: DC | PRN
  Administered 2015-06-16: 80 mg via INTRAVENOUS

## 2015-06-16 MED ORDER — LACTATED RINGERS IV SOLN
INTRAVENOUS | Status: DC
  Administered 2015-06-16: 13:00:00 via INTRAVENOUS

## 2015-06-16 MED ORDER — HYDROMORPHONE HCL 1 MG/ML IJ SOLN
INTRAMUSCULAR | Status: AC
  Filled 2015-06-16: qty 1

## 2015-06-16 MED ORDER — METHYLENE BLUE 0.5 % INJ SOLN
INTRAVENOUS | Status: AC
  Filled 2015-06-16: qty 10

## 2015-06-16 MED ORDER — GLYCOPYRROLATE 0.2 MG/ML IJ SOLN: 0.2000 mg | Freq: Once | INTRAMUSCULAR | Status: DC | PRN

## 2015-06-16 MED ORDER — 0.9 % SODIUM CHLORIDE (POUR BTL) OPTIME
TOPICAL | Status: DC | PRN
  Administered 2015-06-16: 200 mL

## 2015-06-16 MED ORDER — KETOROLAC TROMETHAMINE 30 MG/ML IJ SOLN
INTRAMUSCULAR | Status: AC
  Filled 2015-06-16: qty 1

## 2015-06-16 MED ORDER — PROMETHAZINE HCL 25 MG/ML IJ SOLN
6.2500 mg | INTRAMUSCULAR | Status: DC | PRN
  Administered 2015-06-16: 6.25 mg via INTRAVENOUS

## 2015-06-16 MED ORDER — SUCCINYLCHOLINE CHLORIDE 200 MG/10ML IV SOSY
PREFILLED_SYRINGE | INTRAVENOUS | Status: AC
  Filled 2015-06-16: qty 10

## 2015-06-16 MED ORDER — MIDAZOLAM HCL 2 MG/2ML IJ SOLN
INTRAMUSCULAR | Status: AC
  Filled 2015-06-16: qty 2

## 2015-06-16 MED ORDER — DEXAMETHASONE SODIUM PHOSPHATE 4 MG/ML IJ SOLN
INTRAMUSCULAR | Status: DC | PRN
  Administered 2015-06-16: 10 mg via INTRAVENOUS

## 2015-06-16 MED ORDER — SUCCINYLCHOLINE CHLORIDE 20 MG/ML IJ SOLN
INTRAMUSCULAR | Status: DC | PRN
  Administered 2015-06-16: 120 mg via INTRAVENOUS

## 2015-06-16 MED ORDER — FENTANYL CITRATE (PF) 100 MCG/2ML IJ SOLN
INTRAMUSCULAR | Status: AC
  Filled 2015-06-16: qty 2

## 2015-06-16 MED ORDER — BUPIVACAINE HCL (PF) 0.5 % IJ SOLN
INTRAMUSCULAR | Status: DC | PRN
  Administered 2015-06-16: 30 mL via PERINEURAL

## 2015-06-16 MED ORDER — LACTATED RINGERS IV SOLN
INTRAVENOUS | Status: DC | PRN
  Administered 2015-06-16 (×2): via INTRAVENOUS

## 2015-06-16 MED ORDER — PROMETHAZINE HCL 25 MG/ML IJ SOLN
INTRAMUSCULAR | Status: AC
  Filled 2015-06-16: qty 1

## 2015-06-16 MED ORDER — CEFAZOLIN SODIUM-DEXTROSE 2-4 GM/100ML-% IV SOLN
INTRAVENOUS | Status: AC
  Filled 2015-06-16: qty 100

## 2015-06-16 MED ORDER — FENTANYL CITRATE (PF) 100 MCG/2ML IJ SOLN
50.0000 ug | INTRAMUSCULAR | Status: AC | PRN
  Administered 2015-06-16 (×4): 50 ug via INTRAVENOUS

## 2015-06-16 MED ORDER — BUPIVACAINE-EPINEPHRINE (PF) 0.25% -1:200000 IJ SOLN
INTRAMUSCULAR | Status: AC
  Filled 2015-06-16: qty 30

## 2015-06-16 MED ORDER — TECHNETIUM TC 99M SULFUR COLLOID FILTERED
1.0000 | Freq: Once | INTRAVENOUS | Status: AC | PRN
  Administered 2015-06-16: 1 via INTRADERMAL

## 2015-06-16 MED ORDER — CHLORHEXIDINE GLUCONATE 4 % EX LIQD: 1.0000 "application " | Freq: Once | CUTANEOUS | Status: DC

## 2015-06-16 MED ORDER — BUPIVACAINE-EPINEPHRINE (PF) 0.25% -1:200000 IJ SOLN
INTRAMUSCULAR | Status: DC | PRN
  Administered 2015-06-16: 10 mL

## 2015-06-16 MED ORDER — HYDROMORPHONE HCL 1 MG/ML IJ SOLN
0.2500 mg | INTRAMUSCULAR | Status: DC | PRN
  Administered 2015-06-16: 0.5 mg via INTRAVENOUS

## 2015-06-16 MED ORDER — SCOPOLAMINE 1 MG/3DAYS TD PT72: 1.0000 | MEDICATED_PATCH | Freq: Once | TRANSDERMAL | Status: DC | PRN

## 2015-06-16 MED ORDER — METOCLOPRAMIDE HCL 5 MG/ML IJ SOLN
10.0000 mg | Freq: Once | INTRAMUSCULAR | Status: AC
  Administered 2015-06-16: 10 mg via INTRAVENOUS

## 2015-06-16 MED ORDER — MIDAZOLAM HCL 2 MG/2ML IJ SOLN
1.0000 mg | INTRAMUSCULAR | Status: DC | PRN
  Administered 2015-06-16: 2 mg via INTRAVENOUS

## 2015-06-16 MED ORDER — OXYCODONE-ACETAMINOPHEN 5-325 MG PO TABS: 1.0000 | ORAL_TABLET | ORAL | Status: DC | PRN

## 2015-06-16 MED ORDER — CEFAZOLIN SODIUM 10 G IJ SOLR
3.0000 g | INTRAMUSCULAR | Status: AC
  Administered 2015-06-16: 2 g via INTRAVENOUS

## 2015-06-16 MED ORDER — LIDOCAINE 2% (20 MG/ML) 5 ML SYRINGE
INTRAMUSCULAR | Status: AC
  Filled 2015-06-16: qty 5

## 2015-06-16 MED ORDER — LACTATED RINGERS IV SOLN: 500.0000 mL | INTRAVENOUS | Status: DC

## 2015-06-16 MED ORDER — SODIUM CHLORIDE 0.9 % IJ SOLN
INTRAMUSCULAR | Status: AC
  Filled 2015-06-16: qty 10

## 2015-06-16 MED ORDER — METHYLENE BLUE 0.5 % INJ SOLN
INTRAVENOUS | Status: DC | PRN
  Administered 2015-06-16: 5 mL via INTRAMUSCULAR

## 2015-06-16 MED ORDER — PROPOFOL 10 MG/ML IV BOLUS
INTRAVENOUS | Status: DC | PRN
  Administered 2015-06-16: 200 mg via INTRAVENOUS

## 2015-06-16 MED ORDER — ONDANSETRON HCL 4 MG/2ML IJ SOLN
INTRAMUSCULAR | Status: DC | PRN
  Administered 2015-06-16: 4 mg via INTRAVENOUS

## 2015-06-16 MED ORDER — KETOROLAC TROMETHAMINE 30 MG/ML IJ SOLN
30.0000 mg | Freq: Once | INTRAMUSCULAR | Status: AC | PRN
  Administered 2015-06-16: 30 mg via INTRAVENOUS

## 2015-06-16 SURGICAL SUPPLY — 49 items
APPLIER CLIP 9.375 MED OPEN (MISCELLANEOUS) ×3
BINDER BREAST LRG (GAUZE/BANDAGES/DRESSINGS) IMPLANT
BINDER BREAST MEDIUM (GAUZE/BANDAGES/DRESSINGS) IMPLANT
BINDER BREAST XLRG (GAUZE/BANDAGES/DRESSINGS) ×3 IMPLANT
BINDER BREAST XXLRG (GAUZE/BANDAGES/DRESSINGS) IMPLANT
BLADE SURG 15 STRL LF DISP TIS (BLADE) ×1 IMPLANT
BLADE SURG 15 STRL SS (BLADE) ×2
CANISTER SUC SOCK COL 7IN (MISCELLANEOUS) IMPLANT
CANISTER SUCT 1200ML W/VALVE (MISCELLANEOUS) ×3 IMPLANT
CHLORAPREP W/TINT 26ML (MISCELLANEOUS) ×3 IMPLANT
CLIP APPLIE 9.375 MED OPEN (MISCELLANEOUS) ×1 IMPLANT
COVER BACK TABLE 60X90IN (DRAPES) ×3 IMPLANT
COVER MAYO STAND STRL (DRAPES) ×3 IMPLANT
COVER PROBE W GEL 5X96 (DRAPES) ×3 IMPLANT
DECANTER SPIKE VIAL GLASS SM (MISCELLANEOUS) IMPLANT
DEVICE DUBIN W/COMP PLATE 8390 (MISCELLANEOUS) ×3 IMPLANT
DRAPE LAPAROSCOPIC ABDOMINAL (DRAPES) ×3 IMPLANT
DRAPE UTILITY XL STRL (DRAPES) ×3 IMPLANT
ELECT COATED BLADE 2.86 ST (ELECTRODE) ×3 IMPLANT
ELECT REM PT RETURN 9FT ADLT (ELECTROSURGICAL) ×3
ELECTRODE REM PT RTRN 9FT ADLT (ELECTROSURGICAL) ×1 IMPLANT
GLOVE BIOGEL PI IND STRL 7.0 (GLOVE) ×2 IMPLANT
GLOVE BIOGEL PI IND STRL 8 (GLOVE) ×1 IMPLANT
GLOVE BIOGEL PI INDICATOR 7.0 (GLOVE) ×4
GLOVE BIOGEL PI INDICATOR 8 (GLOVE) ×2
GLOVE ECLIPSE 6.5 STRL STRAW (GLOVE) ×6 IMPLANT
GLOVE ECLIPSE 8.0 STRL XLNG CF (GLOVE) ×3 IMPLANT
GLOVE EXAM NITRILE EXT CUFF MD (GLOVE) ×3 IMPLANT
GOWN STRL REUS W/ TWL LRG LVL3 (GOWN DISPOSABLE) ×3 IMPLANT
GOWN STRL REUS W/TWL LRG LVL3 (GOWN DISPOSABLE) ×6
HEMOSTAT SNOW SURGICEL 2X4 (HEMOSTASIS) ×3 IMPLANT
KIT MARKER MARGIN INK (KITS) ×3 IMPLANT
LIQUID BAND (GAUZE/BANDAGES/DRESSINGS) ×3 IMPLANT
NDL SAFETY ECLIPSE 18X1.5 (NEEDLE) ×1 IMPLANT
NEEDLE HYPO 18GX1.5 SHARP (NEEDLE) ×2
NEEDLE HYPO 25X1 1.5 SAFETY (NEEDLE) ×6 IMPLANT
NS IRRIG 1000ML POUR BTL (IV SOLUTION) ×3 IMPLANT
PACK BASIN DAY SURGERY FS (CUSTOM PROCEDURE TRAY) ×3 IMPLANT
PENCIL BUTTON HOLSTER BLD 10FT (ELECTRODE) ×3 IMPLANT
SLEEVE SCD COMPRESS KNEE MED (MISCELLANEOUS) ×3 IMPLANT
SPONGE LAP 4X18 X RAY DECT (DISPOSABLE) ×6 IMPLANT
SUT MNCRL AB 4-0 PS2 18 (SUTURE) ×6 IMPLANT
SUT VICRYL 3-0 CR8 SH (SUTURE) ×3 IMPLANT
SYR CONTROL 10ML LL (SYRINGE) ×6 IMPLANT
TOWEL OR 17X24 6PK STRL BLUE (TOWEL DISPOSABLE) ×3 IMPLANT
TOWEL OR NON WOVEN STRL DISP B (DISPOSABLE) ×3 IMPLANT
TUBE CONNECTING 20'X1/4 (TUBING) ×1
TUBE CONNECTING 20X1/4 (TUBING) ×2 IMPLANT
YANKAUER SUCT BULB TIP NO VENT (SUCTIONS) ×3 IMPLANT

## 2015-06-16 NOTE — H&P (View-Only) (Signed)
Nicole Holloway 05/27/2015 8:02 AM Location: [facility] Patient #: [id number] DOB: [birth year] Undefined / Language: Cleophus Molt / Race: White Female  History of Present Illness Marcello Moores A. Dason Mosley MD; 05/27/2015 11:19 AM) Patient words: patient is sent at the request of Dr. Pablo Ledger due to right breast mass. 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 She had biopsy of her left breast showed fibrocystic change. Pt complains of right breast pain.  The patient is a 45 year old female.   Other Problems Conni Slipper, RN; 05/27/2015 8:02 AM) Hemorrhoids  Past Surgical History Conni Slipper, RN; 05/27/2015 8:02 AM) Breast Biopsy Bilateral. Cesarean Section - 1  Diagnostic Studies History Conni Slipper, RN; 05/27/2015 8:02 AM) Colonoscopy never Mammogram within last year Pap Smear 1-5 years ago  Medication History Conni Slipper, RN; 05/27/2015 8:02 AM) Medications Reconciled  Social History Conni Slipper, RN; 05/27/2015 8:02 AM) Alcohol use Occasional alcohol use. Caffeine use Carbonated beverages, Coffee, Tea. No drug use Tobacco use Never smoker.  Family History Conni Slipper, RN; 05/27/2015 8:02 AM) Diabetes Mellitus Father. Hypertension Father, Mother.  Pregnancy / Birth History Conni Slipper, RN; 05/27/2015 8:02 AM) Age at menarche 48 years. Gravida 2 Maternal age 71-25 Para 2 Regular periods     Review of Systems Conni Slipper RN; 05/27/2015 8:02 AM) General Present- Night Sweats and Weight Gain. Not Present- Appetite Loss, Chills, Fatigue, Fever and Weight Loss. Skin Present- Rash. Not Present- Change in Wart/Mole, Dryness, Hives, Jaundice, New Lesions, Non-Healing Wounds and Ulcer. HEENT Present- Seasonal Allergies. Not Present- Earache, Hearing Loss, Hoarseness, Nose Bleed, Oral Ulcers, Ringing in the Ears, Sinus Pain, Sore Throat, Visual  Disturbances, Wears glasses/contact lenses and Yellow Eyes. Respiratory Not Present- Bloody sputum, Chronic Cough, Difficulty Breathing, Snoring and Wheezing. Breast Not Present- Breast Mass, Breast Pain, Nipple Discharge and Skin Changes. Cardiovascular Not Present- Chest Pain, Difficulty Breathing Lying Down, Leg Cramps, Palpitations, Rapid Heart Rate, Shortness of Breath and Swelling of Extremities. Gastrointestinal Present- Constipation and Hemorrhoids. Not Present- Abdominal Pain, Bloating, Bloody Stool, Change in Bowel Habits, Chronic diarrhea, Difficulty Swallowing, Excessive gas, Gets full quickly at meals, Indigestion, Nausea, Rectal Pain and Vomiting. Female Genitourinary Not Present- Frequency, Nocturia, Painful Urination, Pelvic Pain and Urgency. Musculoskeletal Present- Back Pain. Not Present- Joint Pain, Joint Stiffness, Muscle Pain, Muscle Weakness and Swelling of Extremities. Neurological Not Present- Decreased Memory, Fainting, Headaches, Numbness, Seizures, Tingling, Tremor, Trouble walking and Weakness. Psychiatric Present- Frequent crying. Not Present- Anxiety, Bipolar, Change in Sleep Pattern, Depression and Fearful. Endocrine Not Present- Cold Intolerance, Excessive Hunger, Hair Changes, Heat Intolerance, Hot flashes and New Diabetes. Hematology Present- Gland problems. Not Present- Easy Bruising, Excessive bleeding, HIV and Persistent Infections.   Physical Exam (Florenda Watt A. Jordis Repetto MD; 05/27/2015 11:20 AM)  General Mental Status-Alert. General Appearance-Consistent with stated age. Hydration-Well hydrated. Voice-Normal.  Head and Neck Head-normocephalic, atraumatic with no lesions or palpable masses. Trachea-midline. Thyroid Gland Characteristics - normal size and consistency.  Chest and Lung Exam Chest and lung exam reveals -quiet, even and easy respiratory effort with no use of accessory muscles and on auscultation, normal breath sounds, no  adventitious sounds and normal vocal resonance. Inspection Chest Wall - Normal. Back - normal.  Breast Breast - Left-Symmetric, Non Tender, No Biopsy scars, no Dimpling, No Inflammation, No Lumpectomy scars, No Mastectomy scars, No Peau d' Orange. Breast - Right-Symmetric, Non Tender, No Biopsy scars, no Dimpling, No Inflammation, No  Lumpectomy scars, No Mastectomy scars, No Peau d' Orange. Breast Lump-No Palpable Breast Mass.  Cardiovascular Cardiovascular examination reveals -normal heart sounds, regular rate and rhythm with no murmurs and normal pedal pulses bilaterally.  Musculoskeletal Normal Exam - Left-Upper Extremity Strength Normal and Lower Extremity Strength Normal. Normal Exam - Right-Upper Extremity Strength Normal and Lower Extremity Strength Normal.  Lymphatic Head & Neck  General Head & Neck Lymphatics: Bilateral - Description - Normal. Axillary  General Axillary Region: Bilateral - Description - Normal. Tenderness - Non Tender.    Assessment & Plan (Kina Shiffman A. Chelsey Redondo MD; 05/27/2015 11:22 AM)  BREAST CANCER, RIGHT (C50.911) Impression: Discussed right breast partial mastectomy and right axillary sentinel lymph node mapping. patient desires breast conservation. Discussed mastectomy and reconstruction. Risk of lumpectomy include bleeding, infection, seroma, more surgery, use of seed/wire, wound care, cosmetic deformity and the need for other treatments, death , blood clots, death. Pt agrees to proceed. Risk of sentinel lymph node mapping include bleeding, infection, lymphedema, shoulder pain. stiffness, dye allergy. cosmetic deformity , blood clots, death, need for more surgery. Pt agrees to proceed.  Current Plans Pt Education - [facility] Breast Cancer Information Given - [facility] "Breast Journey" Package We discussed the staging and pathophysiology of breast cancer. We discussed all of the different options for treatment for breast cancer including surgery,  chemotherapy, radiation therapy, Herceptin, and antiestrogen therapy. We discussed a sentinel lymph node biopsy as she does not appear to having lymph node involvement right now. We discussed the performance of that with injection of radioactive tracer and blue dye. We discussed that she would have an incision underneath her axillary hairline. We discussed that there is a bout a 10-20% chance of having a positive node with a sentinel lymph node biopsy and we will await the permanent pathology to make any other first further decisions in terms of her treatment. One of these options might be to return to the operating room to perform an axillary lymph node dissection. We discussed about a 1-2% risk lifetime of chronic shoulder pain as well as lymphedema associated with a sentinel lymph node biopsy. We discussed the options for treatment of the breast cancer which included lumpectomy versus a mastectomy. We discussed the performance of the lumpectomy with a wire placement. We discussed a 10-20% chance of a positive margin requiring reexcision in the operating room. We also discussed that she may need radiation therapy or antiestrogen therapy or both if she undergoes lumpectomy. We discussed the mastectomy and the postoperative care for that as well. We discussed that there is no difference in her survival whether she undergoes lumpectomy with radiation therapy or antiestrogen therapy versus a mastectomy. There is a slight difference in the local recurrence rate being 3-5% with lumpectomy and about 1% with a mastectomy. We discussed the risks of operation including bleeding, infection, possible reoperation. She understands her further therapy will be based on what her stages at the time of her operation.  Pt Education - [facility] breast cancer surgery: discussed with patient and provided information. Pt Education - [facility] Breast Biopsy HCI: discussed with patient and provided information. Pt Education - [facility] Mastectomy  HCI

## 2015-06-16 NOTE — Anesthesia Postprocedure Evaluation (Signed)
Anesthesia Post Note  Patient: Nicole Holloway  Procedure(s) Performed: Procedure(s) (LRB): BREAST LUMPECTOMY WITH RADIOACTIVE SEED AND SENTINEL LYMPH NODE BIOPSY (Right)  Patient location during evaluation: PACU Anesthesia Type: General and Regional Level of consciousness: awake and alert Pain management: pain level controlled Vital Signs Assessment: post-procedure vital signs reviewed and stable Respiratory status: spontaneous breathing, nonlabored ventilation, respiratory function stable and patient connected to nasal cannula oxygen Cardiovascular status: blood pressure returned to baseline and stable Postop Assessment: no signs of nausea or vomiting Anesthetic complications: no    Last Vitals:  Filed Vitals:   06/16/15 1545 06/16/15 1600  BP: 109/72 108/57  Pulse: 81 81  Temp:    Resp: 17 16    Last Pain:  Filed Vitals:   06/16/15 1601  PainSc: 3                  Armand Preast S

## 2015-06-16 NOTE — Progress Notes (Signed)
Assisted Dr. Rose with right, ultrasound guided, pectoralis block. Side rails up, monitors on throughout procedure. See vital signs in flow sheet. Tolerated Procedure well. 

## 2015-06-16 NOTE — Anesthesia Preprocedure Evaluation (Addendum)
Anesthesia Evaluation  Patient identified by MRN, date of birth, ID band Patient awake    Reviewed: Allergy & Precautions, NPO status , Patient's Chart, lab work & pertinent test results  Airway Mallampati: II  TM Distance: >3 FB Neck ROM: Full    Dental no notable dental hx.    Pulmonary neg pulmonary ROS,    Pulmonary exam normal breath sounds clear to auscultation       Cardiovascular negative cardio ROS Normal cardiovascular exam Rhythm:Regular Rate:Normal     Neuro/Psych negative neurological ROS  negative psych ROS   GI/Hepatic negative GI ROS, Neg liver ROS,   Endo/Other  negative endocrine ROS  Renal/GU negative Renal ROS  negative genitourinary   Musculoskeletal negative musculoskeletal ROS (+)   Abdominal   Peds negative pediatric ROS (+)  Hematology negative hematology ROS (+)   Anesthesia Other Findings   Reproductive/Obstetrics negative OB ROS                             Anesthesia Physical Anesthesia Plan  ASA: II  Anesthesia Plan: General   Post-op Pain Management: GA combined w/ Regional for post-op pain   Induction: Intravenous  Airway Management Planned: Oral ETT  Additional Equipment:   Intra-op Plan:   Post-operative Plan: Extubation in OR  Informed Consent: I have reviewed the patients History and Physical, chart, labs and discussed the procedure including the risks, benefits and alternatives for the proposed anesthesia with the patient or authorized representative who has indicated his/her understanding and acceptance.   Dental advisory given  Plan Discussed with: CRNA and Surgeon  Anesthesia Plan Comments: (Had pizza at 10 am)       Anesthesia Quick Evaluation

## 2015-06-16 NOTE — Discharge Instructions (Signed)
Biopsia de mama (Breast Biopsy) La biopsia de mama es un procedimiento en el cual se extrae Truddie Coco de tejido de la mama. Luego se observa el tejido en el microscopio para detectar clulas cancerosas.  ANTES DEL PROCEDIMIENTO  Haga arreglos para que alguien la lleve a su casa despus de la prueba.  No fume durante las 2semanas anteriores al estudio. Si fuma, abandone el hbito.  No consuma alcohol durante las 24horas anteriores al [place].  Use un buen sostn para el procedimiento.  El mdico puede Optometrist un procedimiento para Glass blower/designer un alambre o una semilla que emiten radiacin en el ndulo mamario. El alambre o la semilla ayudarn a que el mdico vea el ndulo cuando haga la biopsia. PROCEDIMIENTO  Podrn administrarle uno de los siguientes medicamentos:  Un medicamento para adormecer la zona de la mama (anestesia local).  Un medicamento que la har dormir (anestesia general). Hay diferentes tipos de biopsia de mama. Entre los que se incluyen:  Aspiracin con [employer].  Se inserta una aguja en el ndulo de la mama.  Con la aguja se extrae lquido y clulas del ndulo.  A fin de Research scientist (physical sciences) exacto para insertar la aguja, se utiliza una ecografa.  Biopsia con Oletta Lamas.  Se inserta una aguja en el ndulo de la mama.  La aguja se introduce en la mama entre 3 y 6 veces.  Se retira tejido mamario.  Para encontrar el lugar exacto para insertar la aguja se utiliza una ecografa o una radiografa.  Biopsia estereotctica:  Se utilizan equipos radiogrficos y [place] computadora para Physiological scientist imgenes de la tumoracin [place].  Con la computadora se encuentra el sitio exacto dnde colocar la aguja en la mama.  Se extraen muestras de tejido.  Biopsia asistida por vaco.  Se realiza un pequeo corte (incisin) en la mama.  Un equipo para biopsia se pasa a travs de la incisin dentro del tejido [place].  El equipo extrae tejido [employer] anormal.  En  general puede extraerse Truddie Coco mayor de tejido.  No se necesitan puntos de sutura.  Biopsia con aguja gruesa guiada con ecografa.  Con una ecografa se gua la aguja hacia la zona anormal de la mama.  Se hace una incisin en la mama. La aguja se inserta en el ndulo de la mama.  Se extraen muestras de tejido.  Biopsia abierta  Se hace una incisin grande en la mama.  El mdico intentar extirpar todo el tumor de la mama o todo lo que pueda. Todas las muestras de tejidos, lquidos o clulas se observan en el microscopio.  DESPUS DEL PROCEDIMIENTO  La llevarn a una zona de recuperacin. Podr volver a su casa una vez que se encuentre bien y no tenga problemas.  Podr notar hematomas en la mama. Esto es normal.  Pueden colocarle un vendaje (apsito) compresivo en la mama durante 24a 48horas. Este vendaje se envuelve firmemente alrededor del trax. Ayuda a evitar la acumulacin de lquido debajo de los tejidos.   Esta informacin no tiene Marine scientist el consejo del mdico. Asegrese de hacerle al mdico cualquier pregunta que tenga.   Document Released: 07/05/2011 Document Revised: 09/24/2014 Elsevier Interactive Patient Education 2016 [place] tumoral de cncer de mama (Breast Cancer Tumor Analysis) POR QU ME DEBO REALIZAR ESTA PRUEBA? Esta prueba se hace para determinar la probabilidad de que el cncer de mama regrese despus de haber extirpado el tumor. Tambin ayuda a Risk manager que se haga en  el futuro. QU TIPO DE MUESTRA SE TOMA? Para esta prueba, se extrae una biopsia de tejido tumoral de la mama. [place]? Hable con el mdico sobre cualquier preparacin necesaria. El mdico le dar las indicaciones correspondientes. [place]? Los valores de referencia son los valores saludables establecidos despus de realizarle el anlisis a un grupo grande de personas  sanas. Pueden variar [employer], laboratorios y hospitales. Es su responsabilidad retirar el resultado del [place]. Consulte en el laboratorio o en el departamento en el que fue realizado el estudio cundo y cmo podr [employer]. [place]? Los valores normales incluyen lo siguiente:  Ploida del ADN: La diploida es favorable.  Fraccin de faseS: Menos del 5,5% es favorable.  Protena HER2:  Mtodo de IHQ: De 0a1+.  Mtodo de HISF: Menos de 2copias por clula.  Mtodo OncoType Dx: Menos de 10,7unidades.  CatepsinaD: Menos del 10% es favorable.  Protena p53: Menos del 10% es favorable.  Protena Ki67:  Del 10% al 20% es un valor lmite.  Menos del 20% es favorable. Si los resultados estn fuera de los valores favorables, esto significa que los marcadores tumorales estn presentes en cantidades elevadas. Esto puede indicar un tipo de cncer ms agresivo y un mayor riesgo de Scientist, research (physical sciences). Hable con el mdico [employer], las opciones de tratamiento y, si es necesario, la necesidad de Optometrist ms [place]. Hable con el mdico si tiene [employer].   Esta informacin no tiene Marine scientist el consejo del mdico. Asegrese de hacerle al mdico cualquier pregunta que tenga.   Document Released: 04/30/2012 Document Revised: 01/24/2014 Elsevier Interactive Patient Education 2016 [place] After Refer to this sheet in the next few weeks. These instructions provide you with information on caring for yourself after your procedure. Your health care provider may also give you more specific instructions. Your treatment has been planned according to current medical practices, but problems sometimes occur. Call your health care provider if you have any problems or questions after your procedure. WHAT TO EXPECT AFTER THE PROCEDURE After your procedure, it is typical to  have soreness, bruising, and swelling of your breast. This is normal. You will be given medicines to control your pain. HOME CARE INSTRUCTIONS Take medicines only as directed by your health care provider. Resume a normal diet as directed by your health care provider. Resume normal activity as directed by your health care provider. Avoid strenuous activity that affects the arm on the side that the surgical cut (incision) was made. Avoid playing tennis, swimming, lifting heavy objects (those that weigh more than 10 pounds [4.5 kg]), and pulling for 2 weeks. Change bandages (dressings) as directed by your health care provider. Consider wearing a bra to bed if you feel discomfort at the breast. Wearing a bra also helps keep dressings on. Keep all follow-up visits as directed by your health care provider. This is important. Call for the results of your procedure as instructed by your surgeon. It is your responsibility to get your test results. Do not assume everything is fine if you have not heard from your health care provider. Keep the incision site dry. If the incision site is tender, applying an ice pack may relieve some discomfort. To do this: Put ice in a plastic bag. Place a towel between your skin and the bag. Leave the ice on for 15-20 minutes, 3-4 times a day.  SEEK MEDICAL CARE IF:  You have increased bleeding from the incision site. You notice redness, swelling, or increasing pain in the incision. You have pus coming from the incision site. You have a fever. You notice a foul smell coming from the incision or dressing. SEEK IMMEDIATE MEDICAL CARE IF:  You develop a rash. You have shortness of breath. You have chest pain.   This information is not intended to replace advice given to you by your health care provider. Make sure you discuss any questions you have with your health care provider.   Document Released: 01/19/2006 Document Revised: 01/24/2014 Document Reviewed:  08/02/2012   Mastectoma, cuidados posteriores (Lumpectomy, Care After) Siga estas instrucciones durante las prximas semanas. Estas indicaciones le proporcionan informacin general acerca de cmo deber cuidarse despus del procedimiento. El mdico tambin podr darle instrucciones ms especficas. El tratamiento ha sido planificado segn las prcticas mdicas actuales, pero en algunos casos pueden ocurrir problemas. Comunquese con el mdico si tiene algn problema o tiene dudas despus del procedimiento. QU ESPERAR DESPUS DEL PROCEDIMIENTO Despus de Dani Gobble, es habitual sentir dolor y tener hematomas e hinchazn en la mama. Esto es normal. Le darn analgsicos para Financial controller. [place] los medicamentos solamente como se lo haya indicado el mdico.  Retome su dieta normal segn las indicaciones del mdico.  Retome la actividad normal segn las indicaciones del mdico. Evite las actividades extenuantes que afecten el brazo del lado en el que se hizo el corte quirrgico (incisin). Evite jugar al tenis, nadar, jalar y levantar objetos pesados (los que pesen ms de 10libras [4,5kg]) durante 2semanas.  Cambie las vendas (vendajes) como se lo haya indicado el mdico.  Considere usar un sujetador para dormir si siente incomodidad en la mama. Usar un sujetador tambin ayuda a Ecologist.  Concurra a todas las visitas de control como se lo haya indicado el mdico. Esto es importante.  Llame para averiguar cules [employer] del procedimiento cuando el cirujano se lo indique. Es su responsabilidad retirar el resultado del [place]. No suponga que todo est bien si no ha tenido noticias de su mdico.  Sports coach de la incisin.  Si el lugar de la incisin est sensible, aplicar una compresa de hielo puede Secretary/administrator. Haga lo siguiente:  Ponga el hielo en una bolsa  plstica.  Coloque una toalla entre la piel y la bolsa de hielo.  Aplique el hielo de 3 a 4 veces por da durante 15 a 24mnutos. SOLICITE ATENCIN MDICA SI:   Aumenta el sangrado en el lugar de la incisin.  Presenta enrojecimiento, hinchazn o aumento del dolor en la incisin.  Tiene pus en el lugar de la incisin.  Tiene fiebre.  Advierte un olor ftido que proviene de la incisin o el vendaje. SOLICITE ATENCIN MDICA DE INMEDIATO SI:   Le aparece una erupcin cutnea.  Le falta el aire.  Siente dolor en el pecho.   Esta informacin no tiene cMarine scientistel consejo del mdico. Asegrese de hacerle al mdico cualquier pregunta que tenga.   Document Released: 10/24/2012 Document Revised: 01/24/2014 Elsevier Interactive Patient Education 2016 [employer]  EChartered certified accountantPatient Education 2[employer]

## 2015-06-16 NOTE — Interval H&P Note (Signed)
History and Physical Interval Note:  06/16/2015 1:41 PM  Nicole Holloway  has presented today for surgery, with the diagnosis of right breast cancer  The various methods of treatment have been discussed with the patient and family. After consideration of risks, benefits and other options for treatment, the patient has consented to  Procedure(s): BREAST LUMPECTOMY WITH RADIOACTIVE SEED AND SENTINEL LYMPH NODE BIOPSY (Right) as a surgical intervention .  The patient's history has been reviewed, patient examined, no change in status, stable for surgery.  I have reviewed the patient's chart and labs.  Questions were answered to the patient's satisfaction.     [name] A.

## 2015-06-16 NOTE — Op Note (Signed)
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  Anesthesia: Gen. with pectoral block and 0.25% Sensorcaine local with epinephrine  EBL: Minimal  Drains: None  Specimen: Right breast lumpectomy with seed and  Clip in specimen with additional medial margin and to right axillary sentinel nodes blue and hot  Indications for procedure: The patient was seen in the multidisciplinary clinic for right breast cancer. We discussed both mastectomy with reconstruction, sentinel lymph node mapping and partial mastectomy. The pros and cons of each were discussed with the assistance of a translator. She opted for right breast lumpectomy with sentinel lymph node mapping.The procedure has been discussed with the patient. Alternatives to surgery have been discussed with the patient.  Risks of surgery include bleeding,  Infection,  Seroma formation, death,  and the need for further surgery.   The patient understands and wishes to proceed.Sentinel lymph node mapping and dissection has been discussed with the patient.  Risk of bleeding,  Infection,  Seroma formation,  Additional procedures,,  Shoulder weakness ,  Shoulder stiffness,  Nerve and blood vessel injury and reaction to the mapping dyes have been discussed.  Alternatives to surgery have been discussed with the patient.  The patient agrees to proceed.  Description of procedure: The patient was met in the holding area. The right breast was marked as the correct side with the use of the neoprobe to verify proper seed function and location. She also underwent injection of the right breast with technetium sulfur colloid. A pectoral block was placed. Questions are answered with the assistance of a translator and she was taken back to the  operating room for surgery. Once back in the operating suite, she was placed supine on the OR table. After induction of general anesthesia, the right breast was prepped and draped in a sterile fashion. Timeout was done. 4 mL of methylene blue dye were injected in a subareolar position and massage. Neoprobe was used in the cecum was identified. This is located in the right central to upper outer quadrant of the breast. Curvilinear incision was made along the border of the nipple areolar complex and superior skin flap was raised. All tissue around the seating clip were excised. Medial margin close therefore took an additional medial margin. Hemostasis was achieved. Clips are used to mark the cavity in the wound was closed with 3-0 Vicryl and 4-0 Monocryl.  The neoprobe settings were changed technetium. Hotspot identified in the right axilla. Incision was made in the right axilla after injection of local anesthesia 3 cm. Dissection was carried down into the Level One axillary basin 3 sentinel nodes removed the hot and blue. Cavity is made hemostatic and background counts approached 0. The cavity was closed with 3-0 Vicryl and 4-0 Monocryl. The liquid adhesive applied. All final counts found to be correct. Patient was placed in a binder. She was in awoke, extubated taken to recovery in satisfactory condition.

## 2015-06-16 NOTE — Transfer of Care (Signed)
Immediate Anesthesia Transfer of Care Note  Patient: Nicole Holloway  Procedure(s) Performed: Procedure(s): BREAST LUMPECTOMY WITH RADIOACTIVE SEED AND SENTINEL LYMPH NODE BIOPSY (Right)  Patient Location: PACU  Anesthesia Type:General  Level of Consciousness: sedated  Airway & Oxygen Therapy: Patient Spontanous Breathing and Patient connected to face mask oxygen  Post-op Assessment: Report given to RN and Post -op Vital signs reviewed and stable  Post vital signs: Reviewed and stable  Last Vitals:  Filed Vitals:   06/16/15 1330 06/16/15 1345  BP: 106/72 109/68  Pulse: 61 88  Temp:    Resp:      Last Pain:  Filed Vitals:   06/16/15 1356  PainSc: 1          Complications: No apparent anesthesia complications

## 2015-06-16 NOTE — Anesthesia Procedure Notes (Addendum)
Anesthesia Regional Block:  Pectoralis block  Pre-Anesthetic Checklist: ,, timeout performed, Correct Patient, Correct Site, Correct Laterality, Correct Procedure, Correct Position, site marked, Risks and benefits discussed,  Surgical consent,  Pre-op evaluation,  At surgeon's request and post-op pain management  Laterality: Right  Prep: chloraprep       Needles:  Injection technique: Single-shot  Needle Type: Echogenic Needle     Needle Length: 9cm 9 cm Needle Gauge: 21 and 21 G    Additional Needles:  Procedures: ultrasound guided (picture in chart) Pectoralis block Narrative:  Injection made incrementally with aspirations every 5 mL.  Performed by: Personally   Additional Notes: Patient tolerated the procedure well without complications   Procedure Name: Intubation Date/Time: 06/16/2015 2:08 PM Performed by: Lieutenant Diego Pre-anesthesia Checklist: Patient identified, Emergency Drugs available, Suction available and Patient being monitored Patient Re-evaluated:Patient Re-evaluated prior to inductionOxygen Delivery Method: Circle System Utilized Preoxygenation: Pre-oxygenation with 100% oxygen Intubation Type: IV induction, Cricoid Pressure applied and Rapid sequence Laryngoscope Size: Miller and 2 Grade View: Grade I Tube type: Oral Number of attempts: 1 Airway Equipment and Method: Stylet and Oral airway Placement Confirmation: ETT inserted through vocal cords under direct vision,  positive ETCO2 and breath sounds checked- equal and bilateral Secured at: 24 cm Tube secured with: Tape Dental Injury: Teeth and Oropharynx as per pre-operative assessment  Comments: RSI with cricoid pressure. Grade one view, clear of cords. No gastric contents noted. Oral gastric tube inserted to suction then gravity.

## 2015-06-17 ENCOUNTER — Encounter (HOSPITAL_BASED_OUTPATIENT_CLINIC_OR_DEPARTMENT_OTHER): Payer: Self-pay | Admitting: Surgery

## 2015-06-17 ENCOUNTER — Telehealth: Payer: Self-pay | Admitting: Hematology and Oncology

## 2015-06-17 NOTE — Addendum Note (Signed)
Addendum  created 06/17/15 1109 by Navin Dogan W Seena Ritacco, CRNA   Modules edited: Charges VN

## 2015-06-17 NOTE — Telephone Encounter (Signed)
Spoke with patient to confirm appt6 6/6 per 5/30 pof

## 2015-06-23 ENCOUNTER — Encounter: Payer: Self-pay | Admitting: Hematology and Oncology

## 2015-06-23 ENCOUNTER — Ambulatory Visit (HOSPITAL_BASED_OUTPATIENT_CLINIC_OR_DEPARTMENT_OTHER): Payer: Self-pay | Admitting: Hematology and Oncology

## 2015-06-23 VITALS — BP 114/58 | HR 75 | Temp 98.7°F | Resp 18 | Ht 62.0 in | Wt 168.7 lb

## 2015-06-23 DIAGNOSIS — Z170 Estrogen receptor positive status [ER+]: Secondary | ICD-10-CM

## 2015-06-23 DIAGNOSIS — C50411 Malignant neoplasm of upper-outer quadrant of right female breast: Secondary | ICD-10-CM

## 2015-06-23 NOTE — Progress Notes (Signed)
Patient Care Team: Mack Hook, MD as PCP - General (Internal Medicine)  DIAGNOSIS: Breast cancer of upper-outer quadrant of right female breast [facility])   Staging form: Breast, AJCC 7th Edition     Clinical stage from 05/27/2015: Stage IA (T1b, N0, M0) - Unsigned       Staging comments: Staged at breast conference on 5.10.17  SUMMARY OF ONCOLOGIC HISTORY:   Breast cancer of upper-outer quadrant of right female breast ([place])   05/14/2015 Initial Diagnosis Right breast biopsy 10:00: IDC with DCIS, grade 1, ER 90%, PR 80%, Ki-67 3%, HER-2 negative ratio 0.93; right breast distortion on mammogram at 10:00 6 x 4 x 4 mm ultrasound axilla negative, T1b N0 stage IA clinical stage   06/16/2015 Surgery Right lumpectomy: IDC with DCIS, 2.2 cm, 0/3 sentinel nodes negative, T2 N0 stage II a, ER 90%, PR 80%, Ki-67 3%, HER-2 negative ratio 0.93     CHIEF COMPLIANT: Follow-up after lumpectomy  INTERVAL HISTORY: Nicole Holloway is a 45 year old with above-mentioned his right breast cancer treated with lumpectomy and is here to discuss pathology report. she has recovered very well from surgery. She does not have any pain or discomfort or difficulty with range of motion of the arm.  REVIEW OF SYSTEMS:   Constitutional: Denies fevers, chills or abnormal weight loss Eyes: Denies blurriness of vision Ears, nose, mouth, throat, and face: Denies mucositis or sore throat Respiratory: Denies cough, dyspnea or wheezes Cardiovascular: Denies palpitation, chest discomfort Gastrointestinal:  Denies nausea, heartburn or change in bowel habits Skin: Denies abnormal skin rashes Lymphatics: Denies new lymphadenopathy or easy bruising Neurological:Denies numbness, tingling or new weaknesses Behavioral/Psych: Mood is stable, no new changes  Extremities: No lower extremity edema Breast: Recent right lumpectomy All other systems were reviewed with the patient and are negative.  I have reviewed the past medical  history, past surgical history, social history and family history with the patient and they are unchanged from previous note.  ALLERGIES:  has No Known Allergies.  MEDICATIONS:  Current Outpatient Prescriptions  Medication Sig Dispense Refill  . oxyCODONE-acetaminophen (ROXICET) 5-325 MG tablet Take 1-2 tablets by mouth every 4 (four) hours as needed. 30 tablet 0   No current facility-administered medications for this visit.    PHYSICAL EXAMINATION: ECOG PERFORMANCE STATUS: 0 - Asymptomatic  Filed Vitals:   06/23/15 0916  BP: 114/58  Pulse: 75  Temp: 98.7 F (37.1 C)  Resp: 18   Filed Weights   06/23/15 0916  Weight: 168 lb 11.2 oz (76.522 kg)    GENERAL:alert, no distress and comfortable SKIN: skin color, texture, turgor are normal, no rashes or significant lesions EYES: normal, Conjunctiva are pink and non-injected, sclera clear OROPHARYNX:no exudate, no erythema and lips, buccal mucosa, and tongue normal  NECK: supple, thyroid normal size, non-tender, without nodularity LYMPH:  no palpable lymphadenopathy in the cervical, axillary or inguinal LUNGS: clear to auscultation and percussion with normal breathing effort HEART: regular rate & rhythm and no murmurs and no lower extremity edema ABDOMEN:abdomen soft, non-tender and normal bowel sounds MUSCULOSKELETAL:no cyanosis of digits and no clubbing  NEURO: alert & oriented x 3 with fluent speech, no focal motor/sensory deficits EXTREMITIES: No lower extremity edema  LABORATORY DATA:  I have reviewed the data as listed   Chemistry      Component Value Date/Time   NA 140 05/27/2015 0827   NA 140 02/25/2015 1735   NA 139 03/20/2009 1940   K 3.8 05/27/2015 0827   K 4.2 02/25/2015  1735   CL 100 02/25/2015 1735   CO2 27 05/27/2015 0827   CO2 24 02/25/2015 1735   BUN 13.5 05/27/2015 0827   BUN 12 02/25/2015 1735   BUN 14 03/20/2009 1940   CREATININE 0.8 05/27/2015 0827   CREATININE 0.65 02/25/2015 1735        Component Value Date/Time   CALCIUM 9.2 05/27/2015 0827   CALCIUM 9.0 02/25/2015 1735   ALKPHOS 77 05/28/2015 1530   ALKPHOS 67 05/27/2015 0827   AST 21 05/28/2015 1530   AST 15 05/27/2015 0827   ALT 29 05/28/2015 1530   ALT 27 05/27/2015 0827   BILITOT <0.2 05/28/2015 1530   BILITOT 0.59 05/27/2015 0827   BILITOT 0.3 03/20/2009 1940       Lab Results  Component Value Date   WBC 5.9 05/27/2015   HGB 13.8 05/27/2015   HCT 40.4 05/27/2015   MCV 88.0 05/27/2015   PLT 198 05/27/2015   NEUTROABS 3.7 05/27/2015     ASSESSMENT & PLAN:  Breast cancer of upper-outer quadrant of right female breast ([place]) Right lumpectomy 06/16/2015: IDC with DCIS, 2.2 cm, 0/3 sentinel nodes negative, T2 N0 stage II a, ER 90%, PR 80%, Ki-67 3%, HER-2 negative ratio 0.93   Pathology counseling: I discussed the final pathology report of the patient provided  a copy of this report. I discussed the margins as well as lymph node surgeries. We also discussed the final staging along with previously performed ER/PR and HER-2/neu testing.  Recommendations: 1. Oncotype DX testing to determine if chemotherapy would be of any benefit followed by 2. Adjuvant radiation therapy followed by 3. Adjuvant antiestrogen therapy Return to clinic depending on Oncotype DX test result.  No orders of the defined types were placed in this encounter.   The patient has a good understanding of the overall plan. she agrees with it. she will call with any problems that may develop before the next visit here.   Rulon Eisenmenger, MD 06/23/2015

## 2015-06-23 NOTE — Assessment & Plan Note (Signed)
Right lumpectomy 06/16/2015: IDC with DCIS, 2.2 cm, 0/3 sentinel nodes negative, T2 N0 stage II a, ER 90%, PR 80%, Ki-67 3%, HER-2 negative ratio 0.93   Pathology counseling: I discussed the final pathology report of the patient provided  a copy of this report. I discussed the margins as well as lymph node surgeries. We also discussed the final staging along with previously performed ER/PR and HER-2/neu testing.  Recommendations: 1. Oncotype DX testing to determine if chemotherapy would be of any benefit followed by 2. Adjuvant radiation therapy followed by 3. Adjuvant antiestrogen therapy

## 2015-06-26 ENCOUNTER — Telehealth: Payer: Self-pay | Admitting: *Deleted

## 2015-06-26 NOTE — Telephone Encounter (Signed)
Ordered Oncotype Dx test per MD.  Faxed request to [facility] and called Nicole Holloway to verify she received it.  Faxed order to [facility] and placed a note to look for results.

## 2015-07-01 ENCOUNTER — Encounter (HOSPITAL_COMMUNITY): Payer: Self-pay

## 2015-07-03 ENCOUNTER — Telehealth: Payer: Self-pay | Admitting: *Deleted

## 2015-07-03 NOTE — Telephone Encounter (Signed)
Received oncotype score of 10/7%. No chemotherapy. Referral placed for pt to see Dr. Pablo Ledger for xrt.

## 2015-07-06 ENCOUNTER — Telehealth: Payer: Self-pay | Admitting: *Deleted

## 2015-07-06 NOTE — Telephone Encounter (Signed)
With assistance from [place] interpreter, informed pt she does not need chemo and will proceed with xrt. Confirmed appt with dr. Pablo Ledger to start xrt.

## 2015-07-09 NOTE — Progress Notes (Signed)
Location of Breast Cancer: Right Breast  Histology per Pathology Report:  05/14/15 Diagnosis 1. Breast, left, needle core biopsy, UIQ - FIBROCYSTIC CHANGE AND ADENOSIS WITH CALCIFICATIONS. - NO MALIGNANCY IDENTIFIED. 2. Breast, right, needle core biopsy, 10 o'clock, 4 cm fn - INVASIVE DUCTAL CARCINOMA, SEE COMMENT. - DUCTAL CARCINOMA IN SITU.  06/16/15 Diagnosis 1. Breast, lumpectomy - INVASIVE AND IN SITU DUCTAL CARCINOMA, 2.2 CM. - INVASIVE CARCINOMA FOCALLY LESS THAN 0.1 CM FROM POSTERIOR MARGIN. - DUCTAL CARCINOMA IN SITU FOCALLY LESS THAN 0.1 CM FROM MEDIAL MARGIN. - BIOPSY SITE REACTION. 2. Breast, excision, Additional Medial Margin Right - DUCTAL CARCINOMA IN SITU, 0.2 CM. - DUCTAL CARCINOMA IN SITU FOCALLY 0.2 CM FROM FINAL MEDIAL MARGIN. 3. Lymph node, sentinel, biopsy, Right Axillary - ONE BENIGN LYMPH NODE (0/1). 4. Lymph node, sentinel, biopsy, Right - ONE BENIGN LYMPH NODE (0/1). 5. Lymph node, sentinel, biopsy, Right - ONE BENIGN LYMPH NODE (0/1).  Receptor Status: ER(90%), PR (80%), Her2-neu (NEG), Ki-(3%)  Did patient present with symptoms or was this found on screening mammography?: It was found on a one year follow up mammogram for calcifications 05/05/15.  Past/Anticipated interventions by surgeon, if any: 06/16/15 Dr. Brantley Stage Procedure: Right breast seed localized partial mastectomy with right axillary sentinel lymph node mapping with injection of methylene blue dye  She saw Dr. Brantley Stage 07/13/15 and she reports he felt she was healing well.   Past/Anticipated interventions by medical oncology, if any:  Dr. Lindi Adie 06/23/15: Recommendations: 1. Oncotype DX testing to determine if chemotherapy would be of any benefit followed by 2. Adjuvant radiation therapy followed by 3. Adjuvant antiestrogen therapy Return to clinic depending on Oncotype DX test result. ** Received oncotype score of 10/7%. No chemotherapy. (documented 07/03/15)  Lymphedema issues, if any:   She denies any problems. She is able to move her arm in all directions. She does report some soreness this morning, but reports she believes she slept wrong.   Pain issues, if any: She denies.    SAFETY ISSUES:  Prior radiation? No  Pacemaker/ICD? No  Possible current pregnancy? No  Is the patient on methotrexate? No  Current Complaints / other details:   She had a second opinion at [facility] by Dr. Nancie Neas We essentially agree with the originating pathologist's diagnosis. 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 The ADH shows features of low grade DCIS, but, quantitatively, does not reach the threshold for a diagnosis of DCIS. Dr. Richard Miu reviewed part 1, and concurs with the interpretation of ADH. 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 Immunohistochemical stains were performed at the originating institution and reviewed at [facility]. Immunostains for p63, smooth muscle myosin, and calponin are negative surrounding infiltrative appearing glands, supporting presence of small foci of invasive carcinoma.  She recently completed an antibiotic for drainage at her incision site prescribed by Dr. Josetta Huddle office.   BP 108/44 mmHg  Pulse 85  Temp(Src) 98.2 F (36.8 C)  Ht _0  (1.575 m)  Wt 171 lb 4.8 oz (77.701 kg)  BMI 31.32 kg/m2  SpO2 98%   Wt Readings from Last 3 Encounters:  07/15/15 171 lb 4.8 oz (77.701 kg)  06/23/15 168 lb 11.2 oz (76.522 kg)  06/16/15 169 lb 2 oz (76.715 kg)      Nicole Holloway, Nicole Police, RN 07/09/2015,12:12 PM

## 2015-07-13 ENCOUNTER — Other Ambulatory Visit: Payer: Self-pay | Admitting: Surgery

## 2015-07-13 DIAGNOSIS — Z853 Personal history of malignant neoplasm of breast: Secondary | ICD-10-CM

## 2015-07-13 NOTE — Progress Notes (Signed)
Radiation Oncology         [phone]) [phone] ________________________________  Initial outpatient Consultation  Name: Nicole Holloway MRN: [id number]  Date: 07/15/2015  DOB: 07/27/1970  IW:OEHOZYYQ,MGNOIBBCW, MD  Erroll Luna, MD   REFERRING PHYSICIAN: Erroll Luna, MD  DIAGNOSIS:    ICD-9-CM ICD-10-CM   1. 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 female originally seen by Dr. Pablo Ledger in breast clinic on 05/27/15.   The patient had a 1 year follow up mammogram for left breast calcifications on 05/05/15. Bilateral mammography was performed which identified an area of distortion in the right breast with a new group of calcifications in the left breast measuring 12 mm. Stable calcifications were also noted in the left breast. Ultrasound showed a 4 x 4 x 6 mm mass in the right breast with no right axillary lymphadenopathy.  Biopsy of the UIQ of the left breast on 05/14/15 showed fibrocystic change and adenosis with calcifications; no malignancy. Biopsy of the right breast revealed grade 1 invasive ductal carcinoma with DCIS (ER/PR positive, HER2 negative (but there was minimal invasive tumor present for HER2 evaluation), Ki67 3%.   The patient underwent right breast partial mastectomy on 06/16/15 with Dr.Cornett. Pathology indicated invasive and in situ ductal carcinoma measured at 2.2 cm. The IDC was focally less than 0.1 cm from the posterior margin, while the DCIS was focally less than 0.1 cm from the medial margin. However, the pathology report is difficult to interpret - the margin status maybe more generous than 0.1 cm. Of the right axillary lymph nodes biopsied, 0/3 were positive.   She met with Dr. Lindi Adie on 06/23/15 to  discuss Oncotype testing and antiestrogen therapy. Results of Oncotype testing returned with a score of 10/ 7% and therefore will not need chemotherapy.   On today's visit, the patient denies any problems or pains. She is able to move her arm in all directions. She does report some soreness this morning, but reports she believes she slept wrong. Patient states she is not working at this time.   SAFETY ISSUES:  Prior radiation? No  Pacemaker/ICD? No  Possible current pregnancy? No  Is the patient on methotrexate? No   PREVIOUS RADIATION THERAPY: No  PAST MEDICAL HISTORY:  has a past medical history of Abnormal mammogram with microcalcification (07/03/2014); Hyperglycemia (06/09/2014); Elevated liver enzymes (06/09/2014); Constipation; External hemorrhoids; Varicose veins of both lower extremities; Seasonal allergies; Hyperlipidemia (06/04/2014); Hepatic steatosis (09/05/2014); and Breast cancer ([place]).    PAST SURGICAL HISTORY: Past Surgical History  Procedure Laterality Date  . Cesarean section    . Breast biopsy Right 05/14/2015  . Breast lumpectomy with radioactive seed and sentinel lymph node biopsy Right 06/16/2015    Procedure: BREAST LUMPECTOMY WITH RADIOACTIVE SEED AND SENTINEL LYMPH NODE BIOPSY;  Surgeon: Erroll Luna, MD;  Location: [facility];  Service: General;  Laterality: Right;    FAMILY HISTORY: family history includes Diabetes in her father and maternal grandmother; Kidney failure in her maternal grandmother; Other in her father, maternal aunt, and sister.  SOCIAL HISTORY:  reports that she has never smoked. She has never used smokeless tobacco. She reports that she drinks alcohol. She reports that she does not use illicit drugs.  ALLERGIES: Review of patient's allergies indicates no known allergies.  MEDICATIONS:  No current outpatient prescriptions on file.   No current facility-administered medications for this encounter.    REVIEW OF SYSTEMS:   Notable for that above.   PHYSICAL EXAM:  height is '5\' 2"'  (1.575 m) and weight is 171 lb 4.8 oz (77.701 kg). Her temperature is 98.2 F (36.8 C). Her blood pressure is 108/44 and her pulse is 85. Her oxygen saturation is 98%.   General: Alert and oriented, in no acute distress HEENT: Head is normocephalic. Extraocular movements are intact. Oropharynx is clear. Neck: Neck is supple, no palpable cervical or supraclavicular lymphadenopathy. Heart: Regular in rate and rhythm with no murmurs, rubs, or gallops. Chest: Clear to auscultation bilaterally, with no rhonchi, wheezes, or rales. Abdomen: Soft, nontender, nondistended, with no rigidity or guarding. Extremities: No cyanosis or edema. Lymphatics: see Neck Exam Skin: No concerning lesions. Musculoskeletal: symmetric strength and muscle tone throughout. Neurologic: Cranial nerves II through XII are grossly intact. No obvious focalities. Speech is fluent. Coordination is intact. Psychiatric: Judgment and insight are intact. Affect is appropriate. Breasts: Small breasts bilaterally. Healing well, post lumpectomy firmness superior to areola of the right breast. Axillary incision healed well on right. Left breast without masses.  No palpable cervical, supraclavicular, or axillary adenopathy.    ECOG = 0  0 - Asymptomatic (Fully active, able to carry on all predisease activities without restriction)  1 - Symptomatic but completely ambulatory (Restricted in physically strenuous activity but ambulatory and able to carry out work of a light or sedentary nature. For example, light housework, office work)  2 - Symptomatic, <50% in bed during the day (Ambulatory and capable of all self care but unable to carry out any work activities. Up and about more than 50% of waking hours)  3 - Symptomatic, >50% in bed, but not bedbound (Capable of only limited self-care, confined to bed or chair 50% or more of waking hours)  4 - Bedbound (Completely disabled.  Cannot carry on any self-care. Totally confined to bed or chair)  5 - Death   Eustace Pen MM, Creech RH, Tormey DC, et al. [phone]). "Toxicity and response criteria of the [facility]". [place] Oncol. 5 (6): 649-55   LABORATORY DATA:  Lab Results  Component Value Date   WBC 5.9 05/27/2015   HGB 13.8 05/27/2015   HCT 40.4 05/27/2015   MCV 88.0 05/27/2015   PLT 198 05/27/2015   CMP     Component Value Date/Time   NA 140 05/27/2015 0827   NA 140 02/25/2015 1735   NA 139 03/20/2009 1940   K 3.8 05/27/2015 0827   K 4.2 02/25/2015 1735   CL 100 02/25/2015 1735   CO2 27 05/27/2015 0827   CO2 24 02/25/2015 1735   GLUCOSE 106 05/27/2015 0827   GLUCOSE 83 02/25/2015 1735   GLUCOSE 88 03/20/2009 1940   BUN 13.5 05/27/2015 0827   BUN 12 02/25/2015 1735   BUN 14 03/20/2009 1940   CREATININE 0.8 05/27/2015 0827   CREATININE 0.65 02/25/2015 1735   CALCIUM 9.2 05/27/2015 0827   CALCIUM 9.0 02/25/2015 1735   PROT 6.9 05/28/2015 1530   PROT 6.8 05/27/2015 0827   PROT 6.8 03/20/2009 1940   ALBUMIN 4.3 05/28/2015 1530   ALBUMIN 3.8 05/27/2015 0827   ALBUMIN 4.5 03/20/2009 1940   AST 21 05/28/2015 1530   AST 15 05/27/2015 0827   ALT 29 05/28/2015 1530   ALT 27  05/27/2015 0827   ALKPHOS 77 05/28/2015 1530   ALKPHOS 67 05/27/2015 0827   BILITOT <0.2 05/28/2015 1530   BILITOT 0.59 05/27/2015 0827   BILITOT 0.3 03/20/2009 1940   GFRNONAA 109 02/25/2015 1735   GFRAA 126 02/25/2015 1735     RADIOGRAPHY: Nm Sentinel Node Inj-no Rpt (breast)  06/16/2015  CLINICAL DATA: right breast cancer Sulfur colloid was injected intradermally by the nuclear medicine technologist for breast cancer sentinel node localization.   Mm Breast Surgical Specimen  06/16/2015  CLINICAL DATA:  Malignant neoplasm of the upper inner quadrant of the right breast. Radioactive seed localization was performed on 06/12/2015. EXAM: SPECIMEN RADIOGRAPH OF THE RIGHT BREAST COMPARISON:  Previous  exam(s). FINDINGS: Status post excision of the right breast. The radioactive seed and biopsy marker clip are present, completely intact, and were marked for pathology. IMPRESSION: Specimen radiograph of the right breast. Electronically Signed   By: Curlene Dolphin M.D.   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 We discussed the risks, benefits, and side effects of radiotherapy. I recommend radiotherapy to the right breast to reduce her risk of locoregional recurrence by 2/3.  We discussed that radiation would take approximately 4-6 weeks to complete  based on her final breast measurements.    We spoke about acute effects including skin irritation and fatigue as well as much less common late effects including internal organ injury or irritation. We discussed the importance of not being or trying to get pregnant during treatment as radiation treatment could be harmful to the unborn fetus. She is agreeable to get a pregnancy test today to confirm she is not pregnant. We spoke about the latest technology that is used to minimize the risk of late effects for patients undergoing radiotherapy to the breast or chest wall. No guarantees of treatment were given. The patient is enthusiastic about proceeding with treatment. The patient has signed informed consent.  I look forward to participating in the patient's care.  As a precaution, I have ordered for a pregnancy test to be performed after today's visit.  Note: final margin status for her DCIS is unclear in report, unclear whether it is <0.1cm or actually 0.38m due to re-excision. I talked with pathology and they will review her case and addend as necessary.   Although her margins are relatively close, I am not sure Post-op mammography is warranted, as I don't see calcifications noted in the Right breast per her reports.  I  asked radiology to verify this, and they will call me back.    __________________________________________   SEppie Gibson MD  This document serves as a record of services personally performed by SEppie Gibson MD. It was created on her behalf by ADerek Mound a trained medical scribe. The creation of this record is based on the scribe's personal observations and the provider's statements to them. This document has been checked and approved by the attending provider.

## 2015-07-15 ENCOUNTER — Encounter: Payer: Self-pay | Admitting: *Deleted

## 2015-07-15 ENCOUNTER — Encounter: Payer: Self-pay | Admitting: Radiation Oncology

## 2015-07-15 ENCOUNTER — Ambulatory Visit
Admission: RE | Admit: 2015-07-15 | Discharge: 2015-07-15 | Disposition: A | Discharge status: Home / Self Care | Payer: No Typology Code available for payment source | Source: Ambulatory Visit | Attending: Radiation Oncology | Admitting: Radiation Oncology

## 2015-07-15 ENCOUNTER — Ambulatory Visit
Admission: RE | Admit: 2015-07-15 | Discharge: 2015-07-15 | Disposition: A | Discharge status: Home / Self Care | Payer: Self-pay | Source: Ambulatory Visit | Attending: Radiation Oncology | Admitting: Radiation Oncology

## 2015-07-15 VITALS — BP 108/44 | HR 85 | Temp 98.2°F | Ht 62.0 in | Wt 171.3 lb

## 2015-07-15 DIAGNOSIS — C50411 Malignant neoplasm of upper-outer quadrant of right female breast: Secondary | ICD-10-CM

## 2015-07-15 DIAGNOSIS — Z510 Encounter for antineoplastic radiation therapy: Secondary | ICD-10-CM | POA: Insufficient documentation

## 2015-07-15 NOTE — Progress Notes (Signed)
Financial Counseling--spoke with patient and interpretor--she had some questions about bills from [facility]--i did a 3-way call with billing office and they answered all of her questions--patient is starting treatment around the middle of July--will bring in income verification, to see if she qualifies for any grants

## 2015-07-15 NOTE — Addendum Note (Signed)
Encounter addended by: Ernst Spell, RN on: 07/15/2015  9:11 AM<BR>     Documentation filed: Charges VN

## 2015-07-15 NOTE — Progress Notes (Signed)
Patient here inquiring about a bill from [facility] that she received and a bill from [employer].  Interpreter is with her.  Informed her that the [facility] is charges from her biopsy and I will make a copy and give to outreach.  She states she never went to [facility] for her second opinion.  [facility] billing and the bill is for consults on pathology slides and radiology.  Explained that the patient never received 2nd opinion from [facility]. They will place it under review and inform the patient that this may take 30-45 days to hear back.  They will call her.  Relayed this information through the interpreter.

## 2015-07-16 ENCOUNTER — Ambulatory Visit: Payer: No Typology Code available for payment source

## 2015-07-16 ENCOUNTER — Ambulatory Visit: Payer: No Typology Code available for payment source | Admitting: Radiation Oncology

## 2015-07-16 LAB — PREGNANCY, URINE: Pregnancy Test, Urine: NEGATIVE

## 2015-07-17 ENCOUNTER — Other Ambulatory Visit: Payer: Self-pay | Admitting: Radiation Oncology

## 2015-07-17 DIAGNOSIS — C50411 Malignant neoplasm of upper-outer quadrant of right female breast: Secondary | ICD-10-CM

## 2015-07-17 NOTE — Progress Notes (Signed)
I spoke with the radiologist again about the patient's mammograms.  Radiology recommends a post op mammogram before starting RT.  I had told the patient at her consult that this might be a possibility.  We will try to arrange for this to be done prior to her simulation.  -----------------------------------  Eppie Gibson, MD

## 2015-07-20 ENCOUNTER — Other Ambulatory Visit: Payer: Self-pay | Admitting: Radiation Oncology

## 2015-07-20 ENCOUNTER — Telehealth: Payer: Self-pay | Admitting: *Deleted

## 2015-07-20 DIAGNOSIS — C50411 Malignant neoplasm of upper-outer quadrant of right female breast: Secondary | ICD-10-CM

## 2015-07-20 NOTE — Telephone Encounter (Signed)
Called patient to inform of mammogram on 07-24-15- arrival time - 7:50 am @ [facility], lvm for a return call

## 2015-07-23 ENCOUNTER — Other Ambulatory Visit: Payer: Self-pay | Admitting: General Practice

## 2015-07-23 ENCOUNTER — Other Ambulatory Visit: Payer: Self-pay | Admitting: Radiation Oncology

## 2015-07-23 ENCOUNTER — Other Ambulatory Visit: Payer: Self-pay | Admitting: *Deleted

## 2015-07-23 DIAGNOSIS — C50411 Malignant neoplasm of upper-outer quadrant of right female breast: Secondary | ICD-10-CM

## 2015-07-24 ENCOUNTER — Ambulatory Visit
Admission: RE | Admit: 2015-07-24 | Discharge: 2015-07-24 | Disposition: A | Discharge status: Home / Self Care | Payer: No Typology Code available for payment source | Source: Ambulatory Visit | Attending: Radiation Oncology | Admitting: Radiation Oncology

## 2015-07-24 DIAGNOSIS — C50411 Malignant neoplasm of upper-outer quadrant of right female breast: Secondary | ICD-10-CM

## 2015-07-24 NOTE — Progress Notes (Signed)
Radiation Oncology         [phone] ________________________________  Name: Nicole Holloway MRN: [id number]  Date: 07/24/2015  DOB: 08/04/1970  SIMULATION AND TREATMENT PLANNING NOTE    Outpatient  DIAGNOSIS:     ICD-9-CM ICD-10-CM   1. Breast cancer of upper-outer quadrant of right female breast ([place]) 174.4 C50.411     NARRATIVE:  The patient was brought to the [facility].  Identity was confirmed.  All relevant records and images related to the planned course of therapy were reviewed. Mm Diag Breast Tomo Uni Right  07/24/2015  CLINICAL DATA:  45 year old female with right breast lumpectomy 06/16/2015 for recently diagnosed invasive ductal carcinoma and ductal carcinoma in situ. Patient presents for diagnostic mammography prior to beginning radiation therapy. EXAM: 2D DIGITAL DIAGNOSTIC UNILATERAL RIGHT MAMMOGRAM WITH CAD AND ADJUNCT TOMO COMPARISON:  Previous exam(s). ACR Breast Density Category c: The breast tissue is heterogeneously dense, which may obscure small masses. FINDINGS: No suspicious masses or calcifications are seen in the right breast. Postsurgical changes are present in the central upper far posterior right breast as well as within the right axilla. A spot compression magnification MLO view of the lumpectomy site in the right breast was performed. There is no mammographic evidence of locally recurrent malignancy. No suspicious/residual calcifications are identified. Mammographic images were processed with CAD. IMPRESSION: No mammographic evidence of malignancy in the right breast. Specifically, no residual calcifications identified. RECOMMENDATION: Bilateral diagnostic mammography April 2018. I have discussed the findings and recommendations with the patient. Results were also provided in writing at the conclusion of the visit. If applicable, a reminder letter will be sent to the patient regarding the next appointment. BI-RADS CATEGORY  2: Benign.  Electronically Signed   By: Everlean Alstrom M.D.   On: 07/24/2015 09:30   The patient freely provided informed written consent to proceed with treatment after reviewing the details related to the planned course of therapy. The consent form was witnessed and verified by the simulation staff.    Then, the patient was set-up in a stable reproducible supine position for radiation therapy with her ipsilateral arm over her head, and her upper body secured in a custom-made Vac-lok device.  CT images were obtained.  Surface markings were placed.  The CT images were loaded into the planning software.    TREATMENT PLANNING NOTE: Treatment planning then occurred.  The radiation prescription was entered and confirmed.     A total of 3 medically necessary complex treatment devices were fabricated and supervised by me: 2 fields with MLCs for custom blocks to protect heart, and lungs;  and, a Vac-lok. MORE COMPLEX DEVICES MAY BE MADE IN DOSIMETRY FOR FIELD IN FIELD BEAMS FOR DOSE HOMOGENEITY.  I have requested : 3D Simulation  I have requested a DVH of the following structures: lungs, heart, lumpectomy cavity.    The patient will receive  40.05 Gy in 15 fractions to the right breast with 2 tangential fields.  This will  be followed by a boost.  Optical Surface Tracking Plan:  Since intensity modulated radiotherapy (IMRT) and 3D conformal radiation treatment methods are predicated on accurate and precise positioning for treatment, intrafraction motion monitoring is medically necessary to ensure accurate and safe treatment delivery. The ability to quantify intrafraction motion without excessive ionizing radiation dose can only be performed with optical surface tracking. Accordingly, surface imaging offers the opportunity to obtain 3D measurements of patient position throughout IMRT and 3D treatments without excessive radiation exposure. I  am ordering optical surface tracking for this patient's upcoming course of  radiotherapy.  ________________________________   Reference:  Ursula Alert, J, et al. Surface imaging-based analysis of intrafraction motion for breast radiotherapy patients.Journal of [place], n. 6, nov. 2014. ISSN [id number].  Available at: <http://www.jacmp.org/index.php/jacmp/article/view/4957>.    -----------------------------------  Eppie Gibson, MD

## 2015-07-27 ENCOUNTER — Encounter: Payer: Self-pay | Admitting: *Deleted

## 2015-07-28 ENCOUNTER — Telehealth: Payer: Self-pay | Admitting: Hematology and Oncology

## 2015-07-28 NOTE — Telephone Encounter (Signed)
Used the interpreter line to confirm appt 8/11 at 1045 am with pt. Pt confirmed appt date/time

## 2015-07-31 ENCOUNTER — Ambulatory Visit
Admission: RE | Admit: 2015-07-31 | Discharge: 2015-07-31 | Disposition: A | Discharge status: Home / Self Care | Payer: No Typology Code available for payment source | Source: Ambulatory Visit | Attending: Radiation Oncology | Admitting: Radiation Oncology

## 2015-08-03 ENCOUNTER — Ambulatory Visit
Admission: RE | Admit: 2015-08-03 | Discharge: 2015-08-03 | Disposition: A | Discharge status: Home / Self Care | Payer: No Typology Code available for payment source | Source: Ambulatory Visit | Attending: Radiation Oncology | Admitting: Radiation Oncology

## 2015-08-03 ENCOUNTER — Ambulatory Visit: Payer: No Typology Code available for payment source

## 2015-08-03 ENCOUNTER — Encounter: Payer: Self-pay | Admitting: Radiation Oncology

## 2015-08-03 VITALS — BP 104/38 | HR 80 | Temp 98.3°F | Resp 16 | Ht 62.0 in | Wt 173.6 lb

## 2015-08-03 DIAGNOSIS — C50411 Malignant neoplasm of upper-outer quadrant of right female breast: Secondary | ICD-10-CM

## 2015-08-03 MED ORDER — ALRA NON-METALLIC DEODORANT (RAD-ONC)
1.0000 "application " | Freq: Once | TOPICAL | Status: AC
  Administered 2015-08-03: 1 via TOPICAL

## 2015-08-03 MED ORDER — RADIAPLEXRX EX GEL
Freq: Once | CUTANEOUS | Status: AC
  Administered 2015-08-03: 17:00:00 via TOPICAL

## 2015-08-03 NOTE — Progress Notes (Signed)
Mrs. Nicole Holloway has received 1 fraction to her right breast.  Skin to right breast with normal color.  Radiaplex gel given and Alra deodorant with instructions.  Appetite is good.   Denies pain and fatigue.  Interpreter with patient today. BP 104/38 mmHg  Pulse 80  Temp(Src) 98.3 F (36.8 C) (Oral)  Resp 16  Ht 5\' 2"  (1.575 m)  Wt 173 lb 9.6 oz (78.744 kg)  BMI 31.74 kg/m2  SpO2 100%  LMP 07/13/2015

## 2015-08-03 NOTE — Progress Notes (Signed)
Pt here for patient teaching.  Pt given Radiation and You booklet, skin care instructions, Alra deodorant and Radiaplex gel. Pt reports they have not watched the Radiation Therapy Education video, but were given the link to watch.  Reviewed areas of pertinence such as fatigue, skin changes, breast tenderness, breast swelling, cough, shortness of breath, earaches and taste changes . Pt able to give teach back of to pat skin, use unscented/gentle soap and drink plenty of water,apply Radiaplex bid, avoid applying anything to skin within 4 hours of treatment, avoid wearing an under wire bra and to use an electric razor if they must shave. Pt verbalizes understanding of information given and will contact nursing with any questions or concerns.    She was given the spanish radiation therapy and you booklet. Her questions were answered with a spanish interpreter present.    Http://rtanswers.org/treatmentinformation/whattoexpect/index

## 2015-08-03 NOTE — Progress Notes (Signed)
   Weekly Management Note:  Outpatient    ICD-9-CM ICD-10-CM   1. Breast cancer of upper-outer quadrant of right female breast ([place]) 174.4 C50.411     Current Dose:  2.67 Gy  Projected Dose: 50.05 Gy   Narrative:  The patient presents for routine under treatment assessment.  CBCT/MVCT images/Port film x-rays were reviewed.  The chart was checked.  Ms. Wilson Singer has received 1 fraction to her right breast. Skin to the right breast with normal color. Radiaplex gel given and Alra deodorant with instructions. Appetite is good. Denies pain and fatigue. Interpreter with patient today.  Physical Findings:  height is 5\' 2"  (1.575 m) and weight is 173 lb 9.6 oz (78.744 kg). Her oral temperature is 98.3 F (36.8 C). Her blood pressure is 104/38 and her pulse is 80. Her respiration is 16 and oxygen saturation is 100%.   Wt Readings from Last 3 Encounters:  08/03/15 173 lb 9.6 oz (78.744 kg)  07/15/15 171 lb 4.8 oz (77.701 kg)  06/23/15 168 lb 11.2 oz (76.522 kg)   She has some indentation of the superior right areola where the lumpectomy was performed. No discernable skin changes related to radiotherapy.  Impression:  The patient is tolerating radiotherapy.  Plan:  Continue radiotherapy as planned.  ________________________________   Eppie Gibson, M.D.    This document serves as a record of services personally performed by Eppie Gibson, MD. It was created on her behalf by Lendon Collar, a trained medical scribe. The creation of this record is based on the scribe's personal observations and the provider's statements to them. This document has been checked and approved by the attending provider.

## 2015-08-04 ENCOUNTER — Ambulatory Visit
Admission: RE | Admit: 2015-08-04 | Discharge: 2015-08-04 | Disposition: A | Discharge status: Home / Self Care | Payer: No Typology Code available for payment source | Source: Ambulatory Visit | Attending: Radiation Oncology | Admitting: Radiation Oncology

## 2015-08-04 ENCOUNTER — Ambulatory Visit: Payer: No Typology Code available for payment source

## 2015-08-05 ENCOUNTER — Ambulatory Visit: Payer: No Typology Code available for payment source

## 2015-08-05 ENCOUNTER — Ambulatory Visit
Admission: RE | Admit: 2015-08-05 | Discharge: 2015-08-05 | Disposition: A | Discharge status: Home / Self Care | Payer: No Typology Code available for payment source | Source: Ambulatory Visit | Attending: Radiation Oncology | Admitting: Radiation Oncology

## 2015-08-06 ENCOUNTER — Ambulatory Visit
Admission: RE | Admit: 2015-08-06 | Discharge: 2015-08-06 | Disposition: A | Discharge status: Home / Self Care | Payer: No Typology Code available for payment source | Source: Ambulatory Visit | Attending: Radiation Oncology | Admitting: Radiation Oncology

## 2015-08-06 ENCOUNTER — Ambulatory Visit: Payer: No Typology Code available for payment source

## 2015-08-07 ENCOUNTER — Ambulatory Visit: Payer: No Typology Code available for payment source

## 2015-08-07 ENCOUNTER — Ambulatory Visit
Admission: RE | Admit: 2015-08-07 | Discharge: 2015-08-07 | Disposition: A | Discharge status: Home / Self Care | Payer: No Typology Code available for payment source | Source: Ambulatory Visit | Attending: Radiation Oncology | Admitting: Radiation Oncology

## 2015-08-10 ENCOUNTER — Ambulatory Visit
Admission: RE | Admit: 2015-08-10 | Discharge: 2015-08-10 | Disposition: A | Discharge status: Home / Self Care | Payer: Self-pay | Source: Ambulatory Visit | Attending: Radiation Oncology | Admitting: Radiation Oncology

## 2015-08-10 ENCOUNTER — Encounter: Payer: Self-pay | Admitting: Radiation Oncology

## 2015-08-10 ENCOUNTER — Telehealth: Payer: Self-pay | Admitting: *Deleted

## 2015-08-10 ENCOUNTER — Encounter: Payer: Self-pay | Admitting: *Deleted

## 2015-08-10 ENCOUNTER — Ambulatory Visit
Admission: RE | Admit: 2015-08-10 | Discharge: 2015-08-10 | Disposition: A | Discharge status: Home / Self Care | Payer: No Typology Code available for payment source | Source: Ambulatory Visit | Attending: Radiation Oncology | Admitting: Radiation Oncology

## 2015-08-10 ENCOUNTER — Ambulatory Visit: Payer: No Typology Code available for payment source

## 2015-08-10 VITALS — BP 103/70 | HR 70 | Temp 98.2°F | Ht 62.0 in | Wt 173.9 lb

## 2015-08-10 DIAGNOSIS — C50411 Malignant neoplasm of upper-outer quadrant of right female breast: Secondary | ICD-10-CM

## 2015-08-10 NOTE — Progress Notes (Signed)
   Weekly Management Note:  Outpatient    ICD-9-CM ICD-10-CM   1. Breast cancer of upper-outer quadrant of right female breast ([facility]) 174.4 C50.411     Current Dose: 16.02 Gy  Projected Dose: 50.05 Gy   Narrative:  The patient presents for routine under treatment assessment.  CBCT/MVCT images/Port film x-rays were reviewed.  The chart was checked.  [name] is doing well.  Asks if okay to cook over gas stove  Physical Findings:  height is 5\' 2"  (1.575 m) and weight is 173 lb 14.4 oz (78.9 kg). Her temperature is 98.2 F (36.8 C). Her blood pressure is 103/70 and her pulse is 70.   Wt Readings from Last 3 Encounters:  08/10/15 173 lb 14.4 oz (78.9 kg)  08/03/15 173 lb 9.6 oz (78.7 kg)  07/15/15 171 lb 4.8 oz (77.7 kg)   She has some indentation of the superior right areola where the lumpectomy was performed. Hyperpigmentation of right areola/breast; skin is intact  Impression:  The patient is tolerating radiotherapy.  Plan:  Continue radiotherapy as planned. Ok to [employer] over [employer].  ________________________________   Eppie Gibson, M.D.

## 2015-08-10 NOTE — Progress Notes (Signed)
Nicole Holloway presents for her 6th fraction of radiation to her Right Breast. She denies pain and fatigue. Her Right Breast is slightly hyperpigmented. She does report some tenderness around her nipple. She has a question about cooking with a gas stove. She has not been cooking because of the heat, and wonders if she can cook?  BP 103/70   Pulse 70   Temp 98.2 F (36.8 C)   Ht 5\' 2"  (1.575 m)   Wt 173 lb 14.4 oz (78.9 kg)   LMP 07/13/2015   BMI 31.81 kg/m    Wt Readings from Last 3 Encounters:  08/10/15 173 lb 14.4 oz (78.9 kg)  08/03/15 173 lb 9.6 oz (78.7 kg)  07/15/15 171 lb 4.8 oz (77.7 kg)

## 2015-08-10 NOTE — Telephone Encounter (Signed)
  Oncology Nurse Navigator Documentation    Navigator Encounter Type: Telephone (08/10/15 1300)           Patient Visit Type: [id number] (08/10/15 1300) Treatment Phase: First Radiation Tx (08/10/15 1300)                            Time Spent with Patient: 15 (08/10/15 1300)

## 2015-08-11 ENCOUNTER — Ambulatory Visit: Payer: No Typology Code available for payment source

## 2015-08-11 ENCOUNTER — Ambulatory Visit
Admission: RE | Admit: 2015-08-11 | Discharge: 2015-08-11 | Disposition: A | Discharge status: Home / Self Care | Payer: No Typology Code available for payment source | Source: Ambulatory Visit | Attending: Radiation Oncology | Admitting: Radiation Oncology

## 2015-08-12 ENCOUNTER — Ambulatory Visit
Admission: RE | Admit: 2015-08-12 | Discharge: 2015-08-12 | Disposition: A | Discharge status: Home / Self Care | Payer: No Typology Code available for payment source | Source: Ambulatory Visit | Attending: Radiation Oncology | Admitting: Radiation Oncology

## 2015-08-12 ENCOUNTER — Ambulatory Visit: Payer: No Typology Code available for payment source

## 2015-08-13 ENCOUNTER — Ambulatory Visit: Payer: No Typology Code available for payment source

## 2015-08-13 ENCOUNTER — Ambulatory Visit
Admission: RE | Admit: 2015-08-13 | Discharge: 2015-08-13 | Disposition: A | Discharge status: Home / Self Care | Payer: No Typology Code available for payment source | Source: Ambulatory Visit | Attending: Radiation Oncology | Admitting: Radiation Oncology

## 2015-08-14 ENCOUNTER — Encounter: Payer: Self-pay | Admitting: Radiation Oncology

## 2015-08-14 ENCOUNTER — Ambulatory Visit
Admission: RE | Admit: 2015-08-14 | Discharge: 2015-08-14 | Disposition: A | Discharge status: Home / Self Care | Payer: No Typology Code available for payment source | Source: Ambulatory Visit | Attending: Radiation Oncology | Admitting: Radiation Oncology

## 2015-08-14 ENCOUNTER — Ambulatory Visit: Payer: No Typology Code available for payment source

## 2015-08-14 NOTE — Progress Notes (Signed)
Photon Boost Complex Emergency planning/management officer Note  Diagnosis: Breast Cancer   Breast cancer of upper-outer quadrant of right female breast ([place]) 174.4 C50.411   Outpatient  The patient's CT images from her free-breathing simulation were reviewed to plan her boost treatment to her right breast  lumpectomy cavity.  The boost to the lumpectomy cavity will be delivered with 3 photon fields using MLCs for custom blocks again heart and lungs, with 10 and 6 MV photon energy.  This constitutes 3 complex treatment devices. Isodose plan was reviewed and approved. 10 Gy in 5 fractions prescribed.  -----------------------------------  Eppie Gibson, MD

## 2015-08-17 ENCOUNTER — Ambulatory Visit
Admission: RE | Admit: 2015-08-17 | Discharge: 2015-08-17 | Disposition: A | Discharge status: Home / Self Care | Payer: Self-pay | Source: Ambulatory Visit | Attending: Radiation Oncology | Admitting: Radiation Oncology

## 2015-08-17 ENCOUNTER — Ambulatory Visit: Payer: No Typology Code available for payment source

## 2015-08-17 ENCOUNTER — Encounter: Payer: Self-pay | Admitting: Radiation Oncology

## 2015-08-17 ENCOUNTER — Ambulatory Visit
Admission: RE | Admit: 2015-08-17 | Discharge: 2015-08-17 | Disposition: A | Discharge status: Home / Self Care | Payer: No Typology Code available for payment source | Source: Ambulatory Visit | Attending: Radiation Oncology | Admitting: Radiation Oncology

## 2015-08-17 VITALS — BP 105/75 | HR 88 | Temp 97.9°F | Ht 62.0 in | Wt 174.5 lb

## 2015-08-17 DIAGNOSIS — C50411 Malignant neoplasm of upper-outer quadrant of right female breast: Secondary | ICD-10-CM

## 2015-08-17 NOTE — Progress Notes (Signed)
[name] is here for her 11th fraction of radiation to her Right Breast. She denies pain, but does report a sore throat that started recently. She reports some fatigue. Her Right Breast is slightly tender and red. She is using the Radiaplex cream as directed twice daily. She does have several areas to her arms, and back that itch which she thought might be mosquito bites and therefore has avoided going outside.   BP 105/75   Pulse 88   Temp 97.9 F (36.6 C)   Ht 5\' 2"  (1.575 m)   Wt 174 lb 8 oz (79.2 kg)   LMP 07/13/2015   BMI 31.92 kg/m    Wt Readings from Last 3 Encounters:  08/17/15 174 lb 8 oz (79.2 kg)  08/10/15 173 lb 14.4 oz (78.9 kg)  08/03/15 173 lb 9.6 oz (78.7 kg)

## 2015-08-17 NOTE — Progress Notes (Signed)
   Weekly Management Note:  Outpatient    ICD-9-CM ICD-10-CM   1. Breast cancer of upper-outer quadrant of right female breast ([place]) 174.4 C50.411     Current Dose: 29.37 Gy  Projected Dose: 50.05 Gy   Narrative:  The patient presents for routine under treatment assessment.  CBCT/MVCT images/Port film x-rays were reviewed.  The chart was checked.  Nicole Holloway is doing well but has a sore throat and congestion.  Asks about this.  Physical Findings:  height is 5\' 2"  (1.575 m) and weight is 174 lb 8 oz (79.2 kg). Her temperature is 97.9 F (36.6 C). Her blood pressure is 105/75 and her pulse is 88.   Wt Readings from Last 3 Encounters:  08/17/15 174 lb 8 oz (79.2 kg)  08/10/15 173 lb 14.4 oz (78.9 kg)  08/03/15 173 lb 9.6 oz (78.7 kg)     Hyperpigmentation of right areola ; skin is intact and slightly hyperpigmented over right breast   Impression:  The patient is tolerating radiotherapy.  Plan:  Continue radiotherapy as planned.  Call PCP if sore throat persists >2 wks. Likely a common cold. ________________________________   Eppie Gibson, M.D.

## 2015-08-18 ENCOUNTER — Ambulatory Visit: Payer: No Typology Code available for payment source

## 2015-08-18 ENCOUNTER — Ambulatory Visit
Admission: RE | Admit: 2015-08-18 | Discharge: 2015-08-18 | Disposition: A | Discharge status: Home / Self Care | Payer: No Typology Code available for payment source | Source: Ambulatory Visit | Attending: Radiation Oncology | Admitting: Radiation Oncology

## 2015-08-19 ENCOUNTER — Ambulatory Visit
Admission: RE | Admit: 2015-08-19 | Discharge: 2015-08-19 | Disposition: A | Discharge status: Home / Self Care | Payer: Self-pay | Source: Ambulatory Visit | Attending: Radiation Oncology | Admitting: Radiation Oncology

## 2015-08-19 ENCOUNTER — Ambulatory Visit: Payer: No Typology Code available for payment source

## 2015-08-20 ENCOUNTER — Ambulatory Visit: Payer: No Typology Code available for payment source

## 2015-08-20 ENCOUNTER — Ambulatory Visit
Admission: RE | Admit: 2015-08-20 | Discharge: 2015-08-20 | Disposition: A | Discharge status: Home / Self Care | Payer: No Typology Code available for payment source | Source: Ambulatory Visit | Attending: Radiation Oncology | Admitting: Radiation Oncology

## 2015-08-21 ENCOUNTER — Ambulatory Visit: Payer: No Typology Code available for payment source

## 2015-08-21 ENCOUNTER — Ambulatory Visit
Admission: RE | Admit: 2015-08-21 | Discharge: 2015-08-21 | Disposition: A | Discharge status: Home / Self Care | Payer: No Typology Code available for payment source | Source: Ambulatory Visit | Attending: Radiation Oncology | Admitting: Radiation Oncology

## 2015-08-24 ENCOUNTER — Ambulatory Visit
Admission: RE | Admit: 2015-08-24 | Discharge: 2015-08-24 | Disposition: A | Discharge status: Home / Self Care | Payer: No Typology Code available for payment source | Source: Ambulatory Visit | Attending: Radiation Oncology | Admitting: Radiation Oncology

## 2015-08-24 ENCOUNTER — Ambulatory Visit: Payer: No Typology Code available for payment source

## 2015-08-24 ENCOUNTER — Encounter: Payer: Self-pay | Admitting: Radiation Oncology

## 2015-08-24 ENCOUNTER — Ambulatory Visit: Payer: No Typology Code available for payment source | Admitting: Radiation Oncology

## 2015-08-24 VITALS — BP 101/46 | HR 70 | Temp 98.1°F | Ht 62.0 in | Wt 172.9 lb

## 2015-08-24 DIAGNOSIS — C50411 Malignant neoplasm of upper-outer quadrant of right female breast: Secondary | ICD-10-CM | POA: Insufficient documentation

## 2015-08-24 MED ORDER — RADIAPLEXRX EX GEL: Freq: Once | CUTANEOUS | Status: DC

## 2015-08-24 MED ORDER — SONAFINE EX EMUL
1.0000 "application " | Freq: Once | CUTANEOUS | Status: AC
  Administered 2015-08-24: 1 via TOPICAL

## 2015-08-24 NOTE — Progress Notes (Signed)
Nicole Holloway has completed 16 fractions to her right breast.  She is reporting pain in her right breast at an 8/10 due to skin irritation and nipple soreness.  She denies having fatigue.  She is using radiaplex bid.  She reports having some itching.  The skin on her right breast is red.  She has been given sonafine and hydrogel pads to try.  BP (!) 101/46 (BP Location: Left Arm, Patient Position: Sitting)   Pulse 70   Temp 98.1 F (36.7 C) (Oral)   Ht 5\' 2"  (1.575 m)   Wt 172 lb 14.4 oz (78.4 kg)   LMP 07/13/2015   SpO2 100%   BMI 31.62 kg/m    Wt Readings from Last 3 Encounters:  08/24/15 172 lb 14.4 oz (78.4 kg)  08/17/15 174 lb 8 oz (79.2 kg)  08/10/15 173 lb 14.4 oz (78.9 kg)

## 2015-08-24 NOTE — Progress Notes (Signed)
   Weekly Management Note:  Outpatient    ICD-9-CM ICD-10-CM   1. Breast cancer of upper-outer quadrant of right female breast ([facility]) 174.4 C50.411 SONAFINE emulsion 1 application     DISCONTINUED: hyaluronate sodium (RADIAPLEXRX) gel    Current Dose: 42.05 Gy  Projected Dose: 50.05 Gy   Narrative:  The patient presents for routine under treatment assessment.  CBCT/MVCT images/Port film x-rays were reviewed.  The chart was checked.  Nicole Holloway is doing well but has breast pain, soreness. Some itching.   Physical Findings:  height is 5\' 2"  (1.575 m) and weight is 172 lb 14.4 oz (78.4 kg). Her oral temperature is 98.1 F (36.7 C). Her blood pressure is 101/46 (abnormal) and her pulse is 70. Her oxygen saturation is 100%.   Wt Readings from Last 3 Encounters:  08/24/15 172 lb 14.4 oz (78.4 kg)  08/17/15 174 lb 8 oz (79.2 kg)  08/10/15 173 lb 14.4 oz (78.9 kg)     Hyperpigmentation of right areola ; skin is dry, erythematous, and hyperpigmented over right breast   Impression:  The patient is tolerating radiotherapy.  Plan:  Continue radiotherapy as planned.  sonafine and hydrogel pads for skin.  hydrocortisone 1% cream prn itching.  OTC pain meds prn.  F/u in 5-6 wks  ________________________________   Eppie Gibson, M.D.

## 2015-08-25 ENCOUNTER — Ambulatory Visit: Payer: No Typology Code available for payment source

## 2015-08-25 ENCOUNTER — Telehealth: Payer: Self-pay | Admitting: *Deleted

## 2015-08-25 NOTE — Telephone Encounter (Signed)
CALLED  PATIENT TO INFORM OF FU WITH MED/ONC ON 08-28-15 @ 10:45 AM WITH DR. Lindi Adie AND HER FU WITH DR. Isidore Moos ON 10-12-15 @ 8 AM, WAS UNABLE TO LEAVE MESSAGE , WILL ATTACH APPT. CARDS TO CHART

## 2015-08-26 ENCOUNTER — Ambulatory Visit: Payer: No Typology Code available for payment source

## 2015-08-26 ENCOUNTER — Ambulatory Visit
Admission: RE | Admit: 2015-08-26 | Discharge: 2015-08-26 | Disposition: A | Discharge status: Home / Self Care | Payer: Self-pay | Source: Ambulatory Visit | Attending: Radiation Oncology | Admitting: Radiation Oncology

## 2015-08-27 ENCOUNTER — Ambulatory Visit
Admission: RE | Admit: 2015-08-27 | Discharge: 2015-08-27 | Disposition: A | Discharge status: Home / Self Care | Payer: No Typology Code available for payment source | Source: Ambulatory Visit | Attending: Radiation Oncology | Admitting: Radiation Oncology

## 2015-08-27 ENCOUNTER — Ambulatory Visit: Payer: No Typology Code available for payment source

## 2015-08-28 ENCOUNTER — Ambulatory Visit: Payer: No Typology Code available for payment source

## 2015-08-28 ENCOUNTER — Telehealth: Payer: Self-pay | Admitting: Hematology and Oncology

## 2015-08-28 ENCOUNTER — Telehealth: Payer: Self-pay | Admitting: *Deleted

## 2015-08-28 ENCOUNTER — Ambulatory Visit
Admission: RE | Admit: 2015-08-28 | Discharge: 2015-08-28 | Disposition: A | Discharge status: Home / Self Care | Payer: No Typology Code available for payment source | Source: Ambulatory Visit | Attending: Radiation Oncology | Admitting: Radiation Oncology

## 2015-08-28 ENCOUNTER — Encounter: Payer: Self-pay | Admitting: Radiation Oncology

## 2015-08-28 ENCOUNTER — Encounter: Payer: Self-pay | Admitting: Hematology and Oncology

## 2015-08-28 ENCOUNTER — Ambulatory Visit (HOSPITAL_BASED_OUTPATIENT_CLINIC_OR_DEPARTMENT_OTHER): Payer: Self-pay | Admitting: Hematology and Oncology

## 2015-08-28 DIAGNOSIS — C50411 Malignant neoplasm of upper-outer quadrant of right female breast: Secondary | ICD-10-CM

## 2015-08-28 DIAGNOSIS — Z170 Estrogen receptor positive status [ER+]: Secondary | ICD-10-CM

## 2015-08-28 MED ORDER — TAMOXIFEN CITRATE 20 MG PO TABS: 20.0000 mg | ORAL_TABLET | Freq: Every day | ORAL | 3 refills | Status: DC

## 2015-08-28 NOTE — Progress Notes (Signed)
Patient Care Team: Mack Hook, MD as PCP - General (Internal Medicine)  DIAGNOSIS: Breast cancer of upper-outer quadrant of right female breast [facility])   Staging form: Breast, AJCC 7th Edition   - Clinical stage from 05/27/2015: Stage IA (T1b, N0, M0) - Unsigned         Staging comments: Staged at breast conference on 5.10.17   - Pathologic: Stage IIA (T2, N0, cM0) - Signed by Eppie Gibson, MD on 07/15/2015  SUMMARY OF ONCOLOGIC HISTORY:   Breast cancer of upper-outer quadrant of right female breast ([place])   05/14/2015 Initial Diagnosis    Right breast biopsy 10:00: IDC with DCIS, grade 1, ER 90%, PR 80%, Ki-67 3%, HER-2 negative ratio 0.93; right breast distortion on mammogram at 10:00 6 x 4 x 4 mm ultrasound axilla negative, T1b N0 stage IA clinical stage     06/16/2015 Surgery    Right lumpectomy: IDC with DCIS, 2.2 cm, 0/3 sentinel nodes negative, T2 N0 stage II a, ER 90%, PR 80%, Ki-67 3%, HER-2 negative ratio 0.93 , Oncotype DX score 10, 7% ROR     08/03/2015 - 08/28/2015 Radiation Therapy    Adjuvant radiation therapy      CHIEF COMPLIANT: Today is the last day of radiation  INTERVAL HISTORY: Nicole Holloway is a 45 year old with above-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 She has done quite well with radiation with very minimal radiation dermatitis.  REVIEW OF SYSTEMS:   Constitutional: Denies fevers, chills or abnormal weight loss Eyes: Denies blurriness of vision Ears, nose, mouth, throat, and face: Denies mucositis or sore throat Respiratory: Denies cough, dyspnea or wheezes Cardiovascular: Denies palpitation, chest discomfort Gastrointestinal:  Denies nausea, heartburn or change in bowel habits Skin: Denies abnormal skin rashes Lymphatics: Denies new lymphadenopathy or easy  bruising Neurological:Denies numbness, tingling or new weaknesses Behavioral/Psych: Mood is stable, no new changes  Extremities: No lower extremity edema Breast: Mild radiation dermatitis All other systems were reviewed with the patient and are negative.  I have reviewed the past medical history, past surgical history, social history and family history with the patient and they are unchanged from previous note.  ALLERGIES:  has No Known Allergies.  MEDICATIONS:  Current Outpatient Prescriptions  Medication Sig Dispense Refill  . OVER THE COUNTER MEDICATION Over the counter medication for allergies    . tamoxifen (NOLVADEX) 20 MG tablet Take 1 tablet (20 mg total) by mouth daily. 90 tablet 3   No current facility-administered medications for this visit.     PHYSICAL EXAMINATION: ECOG PERFORMANCE STATUS: 1 - Symptomatic but completely ambulatory  Vitals:   08/28/15 1042  BP: (!) 107/48  Pulse: 85  Resp: 18  Temp: 98.7 F (37.1 C)   Filed Weights   08/28/15 1042  Weight: 172 lb (78 kg)    GENERAL:alert, no distress and comfortable SKIN: skin color, texture, turgor are normal, no rashes or significant lesions EYES: normal, Conjunctiva are pink and non-injected, sclera clear OROPHARYNX:no exudate, no erythema and lips, buccal mucosa, and tongue normal  NECK: supple, thyroid normal size, non-tender, without nodularity LYMPH:  no palpable lymphadenopathy in the cervical, axillary or inguinal LUNGS: clear to auscultation and percussion with normal breathing effort HEART: regular rate & rhythm and no murmurs and no lower extremity edema ABDOMEN:abdomen soft, non-tender and normal bowel sounds MUSCULOSKELETAL:no cyanosis of digits and no clubbing  NEURO: alert & oriented x 3 with fluent speech, no focal motor/sensory deficits EXTREMITIES: No lower extremity edema   LABORATORY DATA:  I have reviewed the data as listed   Chemistry      Component Value Date/Time   NA 140  05/27/2015 0827   K 3.8 05/27/2015 0827   CL 100 02/25/2015 1735   CO2 27 05/27/2015 0827   BUN 13.5 05/27/2015 0827   CREATININE 0.8 05/27/2015 0827      Component Value Date/Time   CALCIUM 9.2 05/27/2015 0827   ALKPHOS 77 05/28/2015 1530   ALKPHOS 67 05/27/2015 0827   AST 21 05/28/2015 1530   AST 15 05/27/2015 0827   ALT 29 05/28/2015 1530   ALT 27 05/27/2015 0827   BILITOT <0.2 05/28/2015 1530   BILITOT 0.59 05/27/2015 0827       Lab Results  Component Value Date   WBC 5.9 05/27/2015   HGB 13.8 05/27/2015   HCT 40.4 05/27/2015   MCV 88.0 05/27/2015   PLT 198 05/27/2015   NEUTROABS 3.7 05/27/2015     ASSESSMENT & PLAN:  Breast cancer of upper-outer quadrant of right female breast ([place]) Right lumpectomy 06/16/2015: IDC with DCIS, 2.2 cm, 0/3 sentinel nodes negative, T2 N0 stage II a, ER 90%, PR 80%, Ki-67 3%, HER-2 negative ratio 0.93  Oncotype DX score 10, 7% risk of recurrence, low risk   Current treatment: 1. Adjuvant radiation therapy started 08/03/2015 followed by 2. Adjuvant antiestrogen therapy with tamoxifen 20 mg daily  Tamoxifen counseling: We discussed the risks and benefits of tamoxifen. These include but not limited to insomnia, hot flashes, mood changes, vaginal dryness, and weight gain. Although rare, serious side effects including endometrial cancer, risk of blood clots were also discussed. We strongly believe that the benefits far outweigh the risks. Patient understands these risks and consented to starting treatment. Planned treatment duration is 5 years.  Return to clinic 3 months after starting tamoxifen therapy       No orders of the defined types were placed in this encounter.  The patient has a good understanding of the overall plan. she agrees with it. she will call with any problems that may develop before the next visit here.   Rulon Eisenmenger, MD 08/28/15

## 2015-08-28 NOTE — Assessment & Plan Note (Signed)
Right lumpectomy 06/16/2015: IDC with DCIS, 2.2 cm, 0/3 sentinel nodes negative, T2 N0 stage II a, ER 90%, PR 80%, Ki-67 3%, HER-2 negative ratio 0.93  Oncotype DX score 10, 7% risk of recurrence, low risk   Current treatment: 1. Adjuvant radiation therapy started 08/03/2015 followed by 2. Adjuvant antiestrogen therapy with tamoxifen 20 mg daily  Tamoxifen counseling: We discussed the risks and benefits of tamoxifen. These include but not limited to insomnia, hot flashes, mood changes, vaginal dryness, and weight gain. Although rare, serious side effects including endometrial cancer, risk of blood clots were also discussed. We strongly believe that the benefits far outweigh the risks. Patient understands these risks and consented to starting treatment. Planned treatment duration is 5 years.  Return to clinic 3 months after starting tamoxifen therapy

## 2015-08-28 NOTE — Telephone Encounter (Signed)
appt made and avs printed °

## 2015-08-28 NOTE — Telephone Encounter (Signed)
With assistance from [place] interpreter congratulate pt on completion of xrt and discuss survivorship program

## 2015-08-31 ENCOUNTER — Ambulatory Visit: Payer: No Typology Code available for payment source

## 2015-09-01 ENCOUNTER — Telehealth: Payer: Self-pay | Admitting: Hematology and Oncology

## 2015-09-01 MED FILL — TAMOXIFEN 20 MG TABLET: 20 | 90 days supply | Qty: 90 | Fill #0

## 2015-09-01 NOTE — Telephone Encounter (Signed)
Use language line w/ interpreter [phone] to inform pt of 10/16 appt at 930 am

## 2015-09-02 NOTE — Progress Notes (Signed)
  Radiation Oncology         [phone] ________________________________  Name: Nicole Holloway MRN: [id number]  Date: 08/28/2015  DOB: [birth year]  End of Treatment Note  Diagnosis:     ICD-9-CM ICD-10-CM   1. Breast cancer of upper-outer quadrant of right female breast ([place]) 174.4 C50.411        Indication for treatment:  Curative       Radiation treatment dates:   08/03/2015-08/28/2015  Site/dose:    1. The Right breast was treated to 40.05 Gy in 15 fractions at 2.67 Gy per fraction. 2. The Right breast was boosted to 10 Gy in 5 fractions at 2 Gy per fraction.  Beams/energy:    1. 3D // 10X, 6X 2. Isodose plan // 10X, 6X  Narrative: The patient tolerated radiation treatment relatively well.  The patient complained of breast pain, soreness, and some itching. She developed erythema and hyperpigmentation over the right breast and hyperpigmentation of the right areola. The skin was also noted to be dry.   Plan: The patient has completed radiation treatment. The patient will return to radiation oncology clinic for routine followup in one month. I advised them to call or return sooner if they have any questions or concerns related to their recovery or treatment.  -----------------------------------  Eppie Gibson, MD   This document serves as a record of services personally performed by Eppie Gibson, MD. It was created on her behalf by Arlyce Harman, a trained medical scribe. The creation of this record is based on the scribe's personal observations and the provider's statements to them. This document has been checked and approved by the attending provider.

## 2015-09-09 ENCOUNTER — Telehealth: Payer: Self-pay | Admitting: *Deleted

## 2015-09-09 NOTE — Telephone Encounter (Signed)
Call received from "Josea with [employer] as an interpreter for this patient who has received bills from [place].  She cannot pay these bills.  Needs help processing financial aid application due to Language Barrier."   Call transferred ext: [phone] for RT.

## 2015-10-08 ENCOUNTER — Encounter: Payer: Self-pay | Admitting: Radiation Oncology

## 2015-10-12 ENCOUNTER — Ambulatory Visit
Admission: RE | Admit: 2015-10-12 | Discharge: 2015-10-12 | Disposition: A | Discharge status: Home / Self Care | Payer: No Typology Code available for payment source | Source: Ambulatory Visit | Attending: Radiation Oncology | Admitting: Radiation Oncology

## 2015-10-12 ENCOUNTER — Encounter: Payer: Self-pay | Admitting: Radiation Oncology

## 2015-10-12 DIAGNOSIS — C50411 Malignant neoplasm of upper-outer quadrant of right female breast: Secondary | ICD-10-CM | POA: Insufficient documentation

## 2015-10-12 HISTORY — DX: Personal history of irradiation: Z92.3

## 2015-10-12 NOTE — Progress Notes (Signed)
  Radiation Oncology         [phone] ________________________________  Name: Nicole Holloway MRN: [id number]  Date: 10/12/2015  DOB: 1970-09-26  Follow-Up Visit Note  Outpatient  CC: Mack Hook, MD  Nicholas Lose, MD  Diagnosis and Prior Radiotherapy:    ICD-9-CM ICD-10-CM   1. Breast cancer of upper-outer quadrant of right female breast ([place]) 174.4 C50.411     Indication for treatment:  Curative       Radiation treatment dates:   08/03/2015-08/28/2015  Site/dose:    1. The Right breast was treated to 40.05 Gy in 15 fractions at 2.67 Gy per fraction. 2. The Right breast was boosted to 10 Gy in 5 fractions at 2 Gy per fraction.  Narrative:  The patient returns today for routine follow-up.  She denies pain. She does report some fatigue. She is taking tamoxifen. She will see Dr. Lindi Adie next on 12/28/15 and has a survivorship appointment on 11/02/15. Her skin has some hyperpigmentation to her Right Axilla area. She continues to use Radiaplex to her Right Breast area. I explained via her spanish interpreter to switch to a Vitamin E cream when her Radiaplex tube is completed.                               ALLERGIES:  has No Known Allergies.  Meds: Current Outpatient Prescriptions  Medication Sig Dispense Refill  . OVER THE COUNTER MEDICATION Over the counter medication for allergies    . tamoxifen (NOLVADEX) 20 MG tablet Take 1 tablet (20 mg total) by mouth daily. 90 tablet 3   No current facility-administered medications for this encounter.     Physical Findings: The patient is in no acute distress. Patient is alert and oriented.  height is 5\' 2"  (1.575 m) and weight is 174 lb 6.4 oz (79.1 kg). Her temperature is 98.1 F (36.7 C). Her blood pressure is 103/63 and her pulse is 72. Her oxygen saturation is 96%. Marland Kitchen   .Hyperpigmentation over the right breast, axilla, decreased from end of treatment. Skin intact.    Lab Findings: Lab Results  Component Value Date   WBC 5.9 05/27/2015   HGB 13.8 05/27/2015   HCT 40.4 05/27/2015   MCV 88.0 05/27/2015   PLT 198 05/27/2015    Radiographic Findings: No results found.  Impression/Plan:  Doing well. I encouraged her to continue with yearly mammography and followup with medical oncology. Use vitamin E lotion over skin for further healing.  I will see her back on an as-needed basis. I have encouraged her to call if she has any issues or concerns in the future. I wished her the very best.   _____________________________________   Eppie Gibson, MD

## 2015-10-12 NOTE — Progress Notes (Addendum)
[name] is here for follow up of radiation completed 08/28/15 to her Right Breast. She denies pain. She does report some fatigue. She is taking tamoxifen. She will see Dr. Lindi Adie next on 12/28/15 and has a survivorship appointment on 11/02/15. Her skin has some hyperpigmentation to her Right Axilla area. She continues to use Radiaplex to her Right Breast area. I will explain via her spanish interpreter to switch to a Vitamin E cream when her Radiaplex tube is completed.   BP 103/63   Pulse 72   Temp 98.1 F (36.7 C)   Ht 5\' 2"  (1.575 m)   Wt 174 lb 6.4 oz (79.1 kg)   SpO2 96% Comment: room air  BMI 31.90 kg/m    Wt Readings from Last 3 Encounters:  10/12/15 174 lb 6.4 oz (79.1 kg)  08/28/15 172 lb (78 kg)  08/24/15 172 lb 14.4 oz (78.4 kg)

## 2015-10-13 ENCOUNTER — Telehealth: Payer: Self-pay | Admitting: Hematology and Oncology

## 2015-10-13 NOTE — Telephone Encounter (Signed)
lvm using language line to informpt of 10/6 appt per LOS. Letter sent by mail 9/26

## 2015-10-26 ENCOUNTER — Encounter: Payer: Self-pay | Admitting: Hematology and Oncology

## 2015-10-26 ENCOUNTER — Ambulatory Visit (HOSPITAL_BASED_OUTPATIENT_CLINIC_OR_DEPARTMENT_OTHER): Payer: Self-pay | Admitting: Hematology and Oncology

## 2015-10-26 VITALS — BP 101/56 | HR 76 | Temp 98.3°F | Resp 18 | Ht 62.0 in | Wt 171.4 lb

## 2015-10-26 DIAGNOSIS — Z79810 Long term (current) use of selective estrogen receptor modulators (SERMs): Secondary | ICD-10-CM

## 2015-10-26 DIAGNOSIS — Z170 Estrogen receptor positive status [ER+]: Secondary | ICD-10-CM

## 2015-10-26 DIAGNOSIS — R21 Rash and other nonspecific skin eruption: Secondary | ICD-10-CM

## 2015-10-26 DIAGNOSIS — C50411 Malignant neoplasm of upper-outer quadrant of right female breast: Secondary | ICD-10-CM

## 2015-10-26 MED FILL — METHYLPREDNISOLONE 4 MG TAB: 4 | 6 days supply | Qty: 21 | Fill #0

## 2015-10-26 NOTE — Progress Notes (Signed)
 Patient Care Team: Elizabeth Mulberry, MD as PCP - General (Internal Medicine)  DIAGNOSIS: Breast cancer of upper-outer quadrant of right female breast ([facility])   Staging form: Breast, AJCC 7th Edition   - Clinical stage from 05/27/2015: Stage IA (T1b, N0, M0) - Unsigned         Staging comments: Staged at breast conference on 5.10.17   - Pathologic: Stage IIA (T2, N0, cM0) - Signed by Sarah Squire, MD on 07/15/2015  SUMMARY OF ONCOLOGIC HISTORY:   Breast cancer of upper-outer quadrant of right female breast ([facility])   05/14/2015 Initial Diagnosis    Right breast biopsy 10:00: IDC with DCIS, grade 1, ER 90%, PR 80%, Ki-67 3%, HER-2 negative ratio 0.93; right breast distortion on mammogram at 10:00 6 x 4 x 4 mm ultrasound axilla negative, T1b N0 stage IA clinical stage      06/16/2015 Surgery    Right lumpectomy: IDC with DCIS, 2.2 cm, 0/3 sentinel nodes negative, T2 N0 stage II a, ER 90%, PR 80%, Ki-67 3%, HER-2 negative ratio 0.93 , Oncotype DX score 10, 7% ROR      08/03/2015 - 08/28/2015 Radiation Therapy    Adjuvant radiation therapy      09/28/2015 -  Anti-estrogen oral therapy    Tamoxifen 20 mg daily       CHIEF COMPLIANT: skin rash extensively on the body.  INTERVAL HISTORY: Nicole Holloway is a 45-year-old with above-mentioned history of right lumpectomy and radiation currently on tamoxifen therapy. She has been tolerating tamoxifen fairly well. However she has noticed a sandpaperlike skin rash which started on her right arm and started to extend to other locations including the contralateral arm, legs, buttocks, lower back. She does wish is applied some topical creams but they have not helped her.  REVIEW OF SYSTEMS:   Constitutional: Denies fevers, chills or abnormal weight loss Eyes: Denies blurriness of vision Ears, nose, mouth, throat, and face: Denies mucositis or sore throat Respiratory: Denies cough, dyspnea or wheezes Cardiovascular: Denies palpitation, chest  discomfort Gastrointestinal:  Denies nausea, heartburn or change in bowel habits Skin: extensive maculopapular skin rash throughout her body Lymphatics: Denies new lymphadenopathy or easy bruising Neurological:Denies numbness, tingling or new weaknesses Behavioral/Psych: Mood is stable, no new changes  Extremities: No lower extremity edema Breast:  denies any pain or lumps or nodules in either breasts All other systems were reviewed with the patient and are negative.  I have reviewed the past medical history, past surgical history, social history and family history with the patient and they are unchanged from previous note.  ALLERGIES:  has No Known Allergies.  MEDICATIONS:  Current Outpatient Prescriptions  Medication Sig Dispense Refill  . OVER THE COUNTER MEDICATION Over the counter medication for allergies    . tamoxifen (NOLVADEX) 20 MG tablet Take 1 tablet (20 mg total) by mouth daily. 90 tablet 3   No current facility-administered medications for this visit.     PHYSICAL EXAMINATION: ECOG PERFORMANCE STATUS: 1 - Symptomatic but completely ambulatory  Vitals:   10/26/15 1133  BP: (!) 101/56  Pulse: 76  Resp: 18  Temp: 98.3 F (36.8 C)   Filed Weights   10/26/15 1133  Weight: 171 lb 6.4 oz (77.7 kg)    GENERAL:alert, no distress and comfortable SKIN: extensile sandpaperlike skin rash on her left arm worse than the right EYES: normal, Conjunctiva are pink and non-injected, sclera clear OROPHARYNX:no exudate, no erythema and lips, buccal mucosa, and tongue normal  NECK: supple, thyroid   normal size, non-tender, without nodularity LYMPH:  no palpable lymphadenopathy in the cervical, axillary or inguinal LUNGS: clear to auscultation and percussion with normal breathing effort HEART: regular rate & rhythm and no murmurs and no lower extremity edema ABDOMEN:abdomen soft, non-tender and normal bowel sounds MUSCULOSKELETAL:no cyanosis of digits and no clubbing  NEURO:  alert & oriented x 3 with fluent speech, no focal motor/sensory deficits EXTREMITIES: No lower extremity edema  LABORATORY DATA:  I have reviewed the data as listed   Chemistry      Component Value Date/Time   NA 140 05/27/2015 0827   K 3.8 05/27/2015 0827   CL 100 02/25/2015 1735   CO2 27 05/27/2015 0827   BUN 13.5 05/27/2015 0827   CREATININE 0.8 05/27/2015 0827      Component Value Date/Time   CALCIUM 9.2 05/27/2015 0827   ALKPHOS 77 05/28/2015 1530   ALKPHOS 67 05/27/2015 0827   AST 21 05/28/2015 1530   AST 15 05/27/2015 0827   ALT 29 05/28/2015 1530   ALT 27 05/27/2015 0827   BILITOT <0.2 05/28/2015 1530   BILITOT 0.59 05/27/2015 0827       Lab Results  Component Value Date   WBC 5.9 05/27/2015   HGB 13.8 05/27/2015   HCT 40.4 05/27/2015   MCV 88.0 05/27/2015   PLT 198 05/27/2015   NEUTROABS 3.7 05/27/2015   ASSESSMENT & PLAN:  Breast cancer of upper-outer quadrant of right female breast ([place]) Right lumpectomy 06/16/2015: IDC with DCIS, 2.2 cm, 0/3 sentinel nodes negative, T2 N0 stage II a, ER 90%, PR 80%, Ki-67 3%, HER-2 negative ratio 0.93  Oncotype DX score 10, 7% risk of recurrence, low risk  Current treatment: 1. Adjuvant radiation therapy started 08/03/2015 followed by 2. Adjuvant antiestrogen therapy with tamoxifen 20 mg daily  Tamoxifen Toxicities: Denies any hot flashes or myalgias.  Extensive sandpaper like skin rash on her extremities lower back and buttocks: This is been going on for the past 3 weeks. She tells that her mother also had similar rash and is getting itching sensation. Patient has been scratching all over her body and that is causing her significant discomfort. She denies any prior rashes. She does believe that the rash started actually before tamoxifen. In spite of this we asked her to stop tamoxifen and observe. She comes in for two-week follow-up and reports the skin rashes still present in spite of stopping  tamoxifen.  Recommendation: Medrol Dosepak and referral to dermatology.   RTC in 6 months   Orders Placed This Encounter  Procedures  . Ambulatory referral to Dermatology    Referral Priority:   Routine    Referral Type:   Consultation    Referral Reason:   Specialty Services Required    Requested Specialty:   Dermatology    Number of Visits Requested:   1   The patient has a good understanding of the overall plan. she agrees with it. she will call with any problems that may develop before the next visit here.   Rulon Eisenmenger, MD 10/26/15

## 2015-10-26 NOTE — Assessment & Plan Note (Signed)
Right lumpectomy 06/16/2015: IDC with DCIS, 2.2 cm, 0/3 sentinel nodes negative, T2 N0 stage II a, ER 90%, PR 80%, Ki-67 3%, HER-2 negative ratio 0.93  Oncotype DX score 10, 7% risk of recurrence, low risk   Current treatment: 1. Adjuvant radiation therapy started 08/03/2015 followed by 2. Adjuvant antiestrogen therapy with tamoxifen 20 mg daily  Tamoxifen Toxicities:  RTC in 6 months

## 2015-10-29 NOTE — Progress Notes (Signed)
Approached by Nicole Holloway with our interpreter services regarding questions this patient has in regards to current rash and tamoxifen.  Reviewed information with Dr. Lindi Adie who stated pt can resume tamoxifen.  Pt also has dermatology consult pending for rash evaluation.  All information discussed with Nicole Holloway who will relay to pt.

## 2015-11-02 ENCOUNTER — Encounter: Payer: No Typology Code available for payment source | Admitting: Adult Health

## 2015-11-20 ENCOUNTER — Ambulatory Visit (INDEPENDENT_AMBULATORY_CARE_PROVIDER_SITE_OTHER): Payer: Self-pay | Admitting: Internal Medicine

## 2015-11-20 ENCOUNTER — Encounter: Payer: Self-pay | Admitting: Internal Medicine

## 2015-11-20 DIAGNOSIS — L858 Other specified epidermal thickening: Secondary | ICD-10-CM

## 2015-11-20 DIAGNOSIS — L309 Dermatitis, unspecified: Secondary | ICD-10-CM | POA: Insufficient documentation

## 2015-11-20 MED ORDER — TRIAMCINOLONE ACETONIDE 0.1 % EX CREA: TOPICAL_CREAM | CUTANEOUS | 1 refills | Status: DC

## 2015-11-20 NOTE — Patient Instructions (Signed)
Eucerin Cream for Eczema Relief dos veces al dia--todo el cuerpo despues Triamcinolone

## 2015-11-20 NOTE — Progress Notes (Signed)
   Subjective:    Patient ID: Nicole Holloway, female    DOB: 1970-09-01, 45 y.o.   MRN: [id number]  HPI  Pruritic Rash:   Patient with right lumpectomy 06/16/15 for breast CA for which she was treated with adjuvant radiation therapy from 7/17 to 08/28/2015. Was started on Tamoxifen 09/18/2015.   Prior to Tamoxifen, was really not taking any medication regularly. Was taking Forever A-Beta-CarE and Forever Garlic-Thyme herbal remedies that also contain a number of inert ingredients.  She states she was taking these for years.  Stopped them both a couple of weeks ago.  The rash started end of July at some point on bilateral extensor surfaces of elbows.  She states it looks like a scratch where it itches.  Skin involved also on lateral right lower leg and right flank, low back and down onto upper buttocks, nape of neck.    City water, no recent move.  Dove soap.  Warm showers.  Is using Curel lotion with Vitamins D and E after showers. Did start the Curel after the rash started. Using OTC Hydrocortisone cream twice daily only when she feels itchy.   Was seen by Oncology and started on Methylprednisolone burst and taper over 6-7 days beginning on 10/26/2015.  Did help, but still with some rash.   Held her Tamoxifen for 2 weeks ending 10/28/2015, again had not started this however, until after the rash started.   No new topical exposures she can think of prior to rash starting.  She isn't really eating anything differently than she ate before.   Current Meds  Medication Sig  . tamoxifen (NOLVADEX) 20 MG tablet Take 1 tablet (20 mg total) by mouth daily.   No Known Allergies  Review of Systems     Objective:   Physical Exam Nape of neck, right flank, mid low back, right buttock, Bilateral elbows and lateral lower leg with tiny bumps, dryness and scabbing with scratch marks in some areas.  Hyperpigmentation in several areas associated with scratching Also with perifollicular dry white bumps on  outer upper arms--right more than left       Assessment & Plan:  Dry skin/eczema/keratosis pilaris:  Decrease hot water with bathing and limit time in shower. Apply Triamcinolone cream to itchy patches twice daily, especially after pat dry following bathing, and follow with Eucerin with eczema relief all over. No Triamcinolone to face should she develop itching there. Continue Dove soap. Call if no improvement in 2-3 weeks or if worsens. CPE with pap in 3 months

## 2015-12-07 ENCOUNTER — Telehealth (HOSPITAL_COMMUNITY): Payer: Self-pay | Admitting: *Deleted

## 2015-12-07 NOTE — Telephone Encounter (Signed)
Received voicemail from [place] from [facility] that he is working with patient on bills received.   Called him back and left voicemail for him to call me back.  Jose called back. Asked him if can either get patient's contact information or have patient call since Spanish interpreter Benjamine Sprague in office.  Patient called back. Benjamine Sprague answered patients questions and explained what [facility] covered. Patient asking about a bill received for 06/16/2015 for her surgery. It is the hospital charges. Explained to patient that it is not covered by [facility] and will need to complete financial assistance paperwork. Gregary Signs answered questions and mailed financial assistance paperwork.

## 2015-12-24 MED FILL — TAMOXIFEN 20 MG TABLET: 20 | 90 days supply | Qty: 90 | Fill #1

## 2015-12-26 IMAGING — MG MS DIGITAL SCREENING BILAT
3 series · 3 of 3 positions shown · non-contrast
Comparison: None.

CLINICAL DATA: Screening.

EXAM:
DIGITAL SCREENING BILATERAL MAMMOGRAM WITH CAD

[R CC]
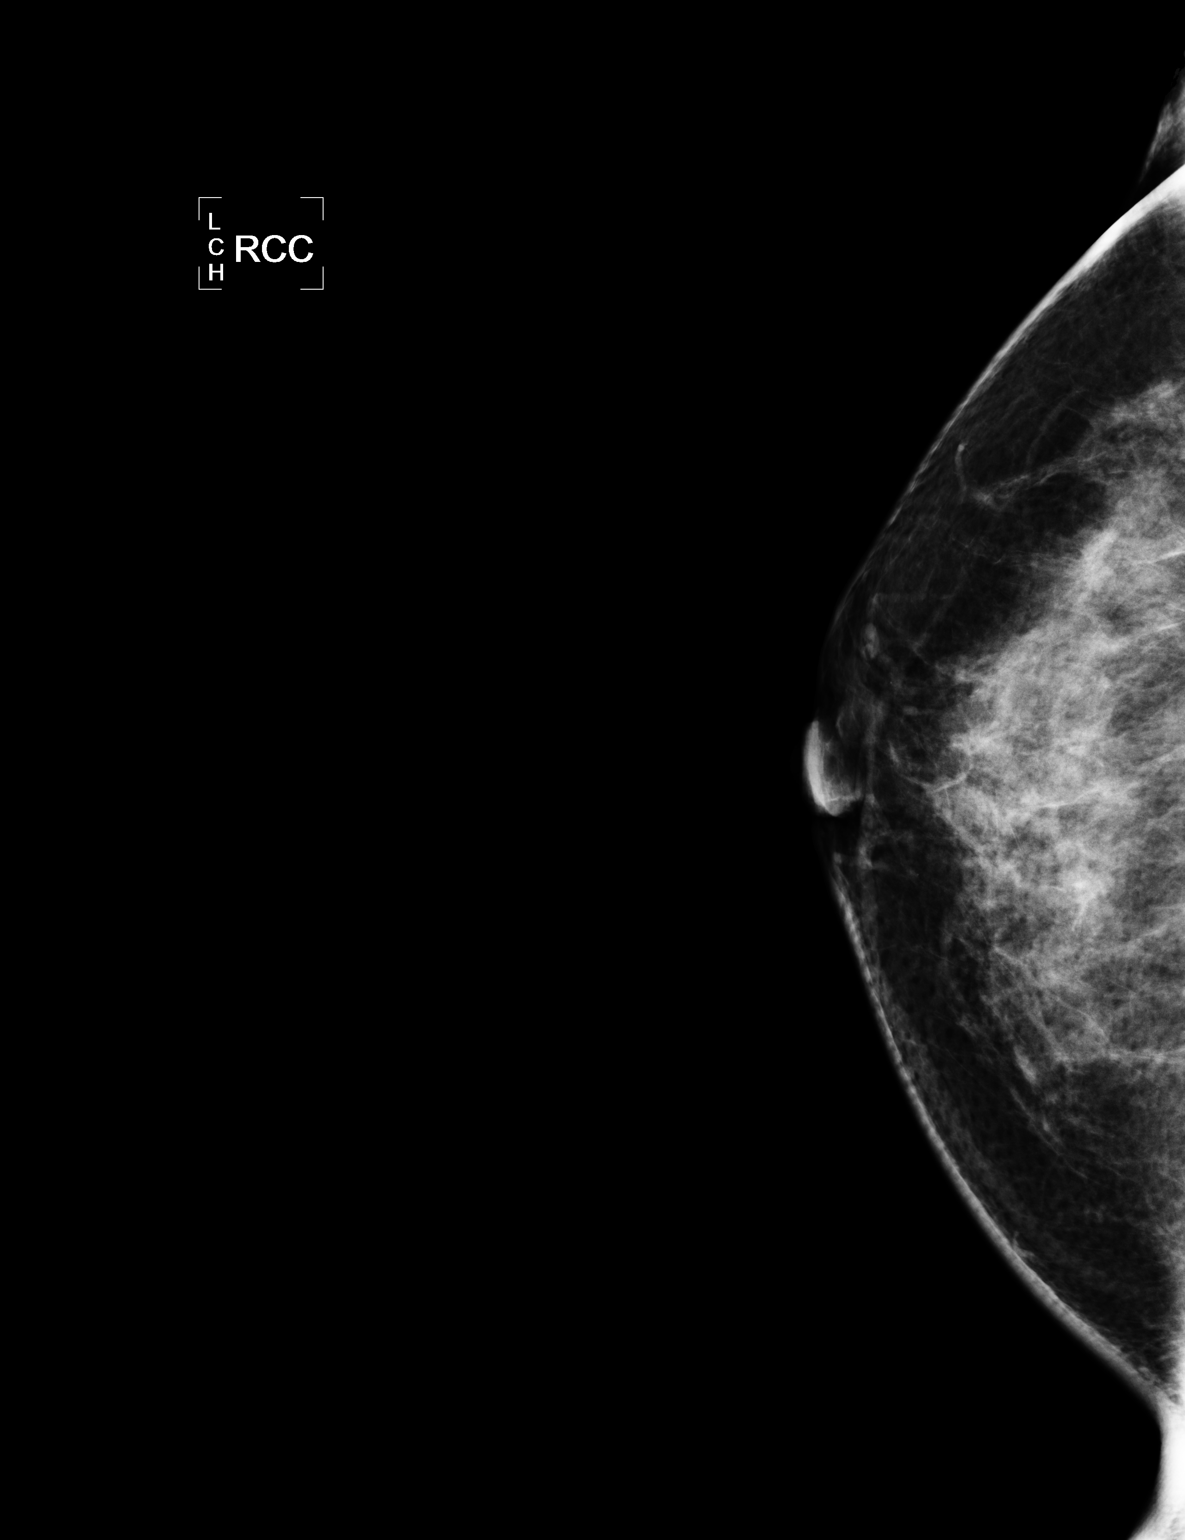

[R MLO]
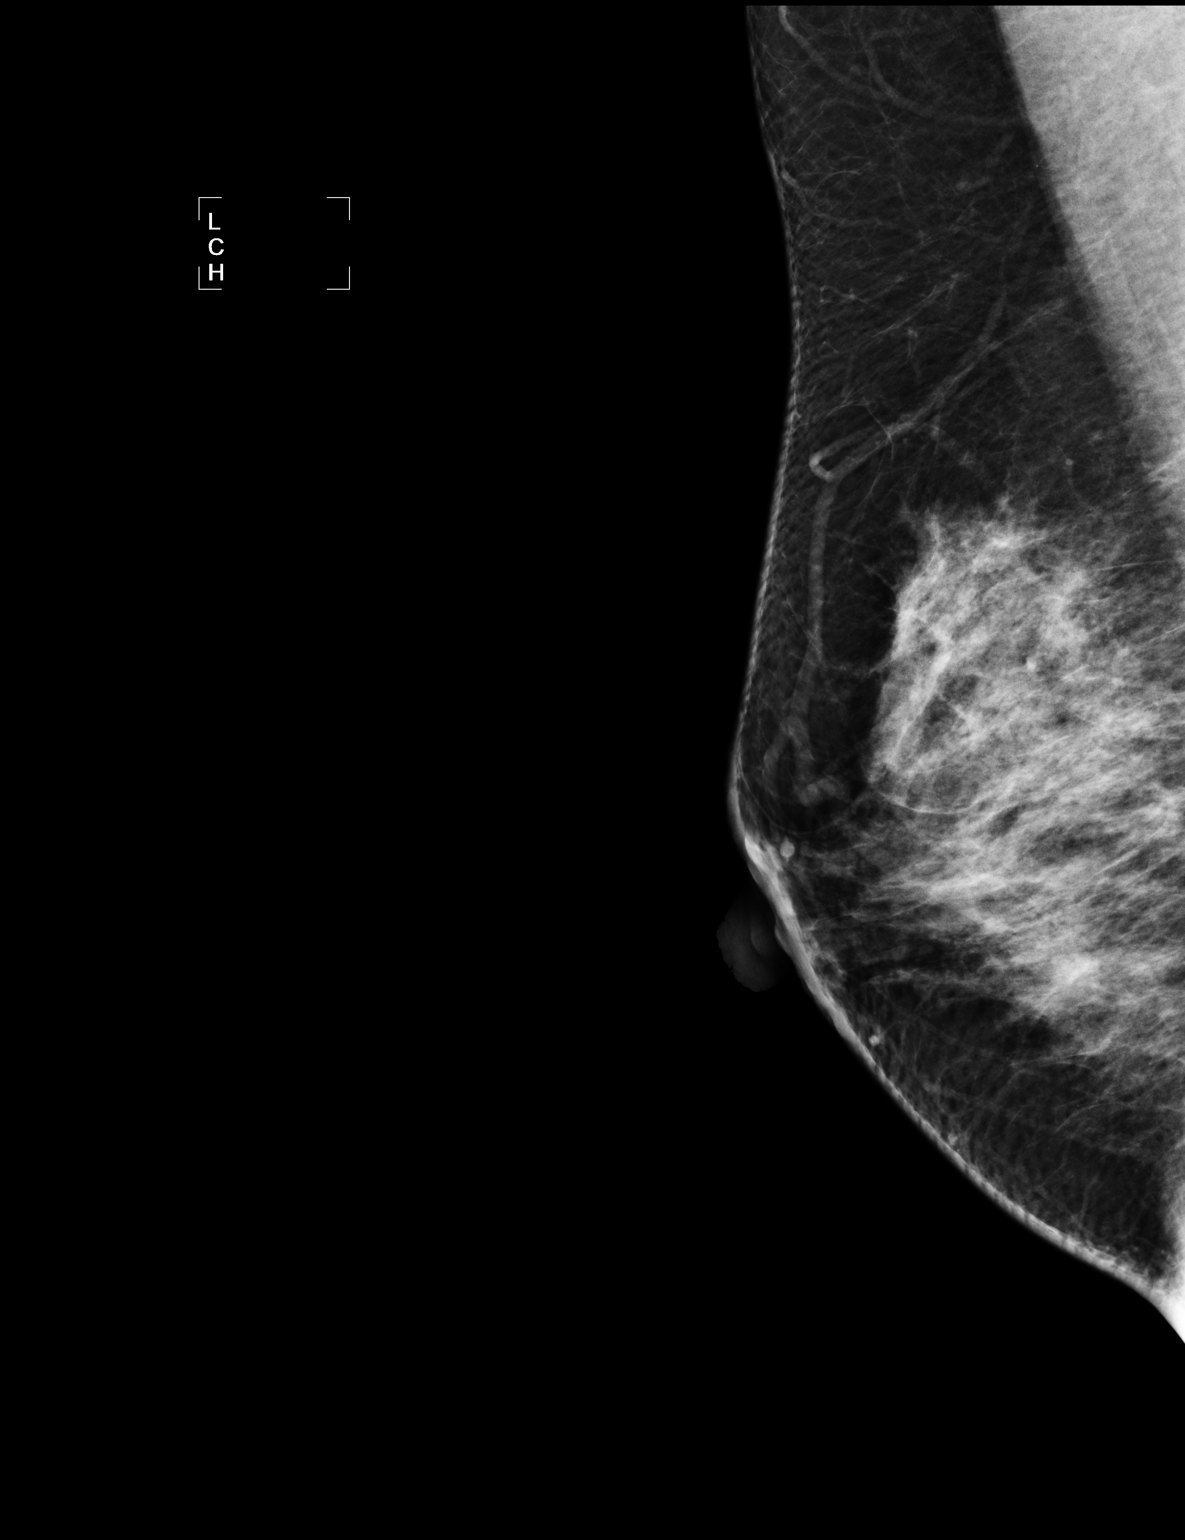

[L MLO]
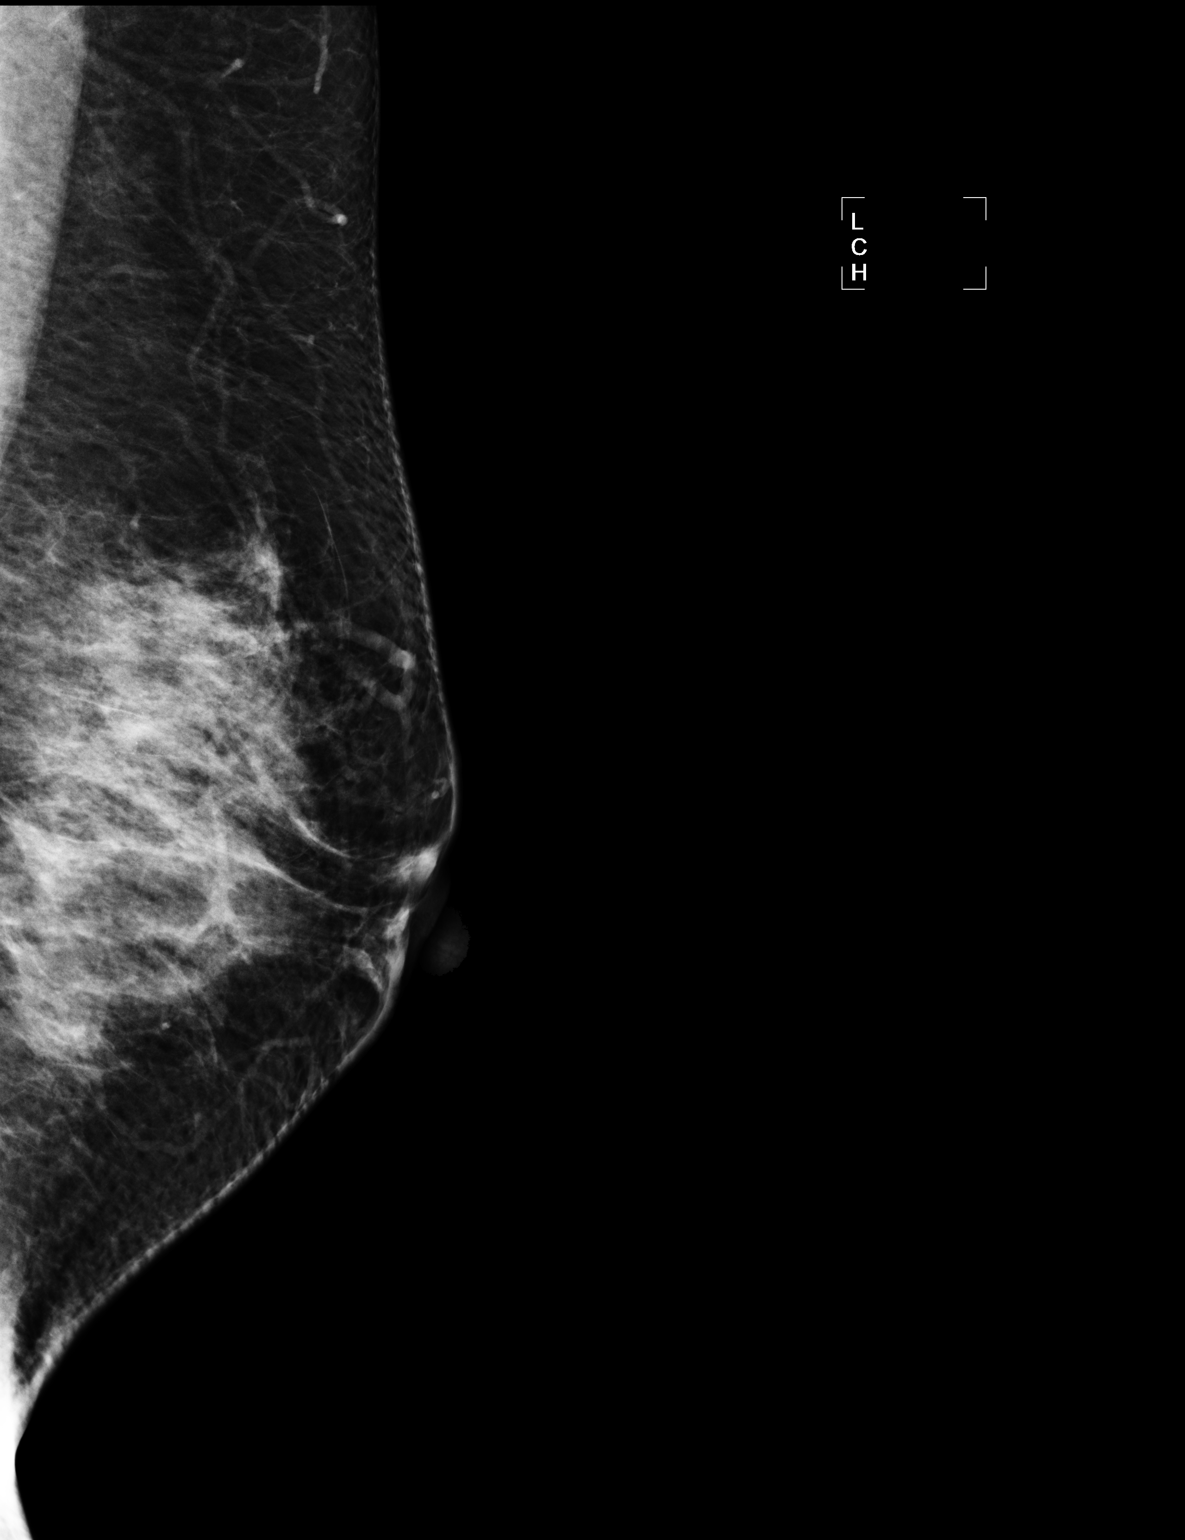

[3 of 3 positions shown; findings below may reference images not displayed]

ACR Breast Density Category c: The breast tissue is heterogeneously
dense, which may obscure small masses.
FINDINGS: In the left breast, calcifications warrant further evaluation with
magnified views. In the right breast, no findings suspicious for
malignancy. Images were processed with CAD.
IMPRESSION: Further evaluation is suggested for calcifications in the left
breast.

RECOMMENDATION:
Diagnostic mammogram of the left breast. (Code:[id number])

The patient will be contacted regarding the findings, and additional
imaging will be scheduled.

BI-RADS CATEGORY  0: Incomplete. Need additional imaging evaluation
and/or prior mammograms for comparison.

## 2015-12-28 ENCOUNTER — Ambulatory Visit: Payer: No Typology Code available for payment source | Admitting: Hematology and Oncology

## 2016-01-29 ENCOUNTER — Encounter: Payer: Self-pay | Admitting: Adult Health

## 2016-01-29 NOTE — Progress Notes (Signed)
Survivorship Care Plan document has been translated to Spanish by our [employer] team.    This document has been mailed to the patient in both [place], along with a letter encouraging her to contact our office with any questions or concerns.   Mike Craze, NP [place] [phone]

## 2016-02-18 ENCOUNTER — Ambulatory Visit (HOSPITAL_BASED_OUTPATIENT_CLINIC_OR_DEPARTMENT_OTHER): Payer: Self-pay | Admitting: Adult Health

## 2016-02-18 ENCOUNTER — Encounter: Payer: Self-pay | Admitting: Adult Health

## 2016-02-18 VITALS — BP 99/56 | HR 71 | Temp 98.1°F | Resp 17 | Wt 166.4 lb

## 2016-02-18 DIAGNOSIS — Z170 Estrogen receptor positive status [ER+]: Secondary | ICD-10-CM

## 2016-02-18 DIAGNOSIS — C50411 Malignant neoplasm of upper-outer quadrant of right female breast: Secondary | ICD-10-CM

## 2016-02-18 DIAGNOSIS — Z79810 Long term (current) use of selective estrogen receptor modulators (SERMs): Secondary | ICD-10-CM

## 2016-02-18 NOTE — Progress Notes (Signed)
Holloway:  Survivorship   REASON FOR VISIT:  Routine follow-up post-treatment for a recent history of breast cancer.  BRIEF ONCOLOGIC HISTORY:    Breast cancer of upper-outer quadrant of right female breast ([place])   05/14/2015 Initial Diagnosis    Right breast biopsy 10:00: IDC with DCIS, grade 1, ER 90%, PR 80%, Ki-67 3%, HER-2 negative ratio 0.93; right breast distortion on mammogram at 10:00 6 x 4 x 4 mm ultrasound axilla negative, T1b N0 stage IA clinical stage      05/28/2015 Genetic Testing    Genetic testing did not identify a deleterious mutation.  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 Genes tested include: APC, ATM, BARD1, BMPR1A, BRCA1, BRCA2, BRIP1, CHD1, CDK4, CDKN2A, CHEK2, EPCAM (large rearrangement only), GREM1, MLH1, MSH2, MSH6, MUTYH, NBN, PALB2, PMS2, POLD1, POLE, PTEN, RAD51C, RAD51D, SMAD4, STK11, and TP53      06/08/2015 Procedure    Genetic testing: VUS on STK11 gene (considered negative). Genes analyzed:  APC, ATM, BARD1, BMPR1A, BRCA1, BRCA2, BRIP1, CHD1, CDK4, CDKN2A, CHEK2, EPCAM (large rearrangement only), GREM1, MLH1, MSH2, MSH6, MUTYH, NBN, PALB2, PMS2, POLD1, POLE, PTEN, RAD51C, RAD51D, SMAD4, STK11, and TP53      06/16/2015 Surgery    Right lumpectomy (Nicole Holloway): IDC with DCIS, 2.2 cm, 0/3 sentinel nodes negative, T2 N0 stage II a, ER 90%, PR 80%, Ki-67 3%, HER-2 negative ratio 0.93 , Oncotype DX score 10, 7% ROR      06/16/2015 Oncotype testing    Recurrence score: 10; ROR 7% (low-risk)       08/03/2015 - 08/28/2015 Radiation Therapy    Adjuvant radiation therapy Nicole Holloway). The Right breast was treated to 40.05 Gy in 15 fractions at 2.67 Gy per fraction. Right breast was boosted to 10 Gy in 5 fractions at 2 Gy per fraction.      09/28/2015 -  Anti-estrogen oral therapy    Tamoxifen 20 mg daily       INTERVAL HISTORY:  Nicole Holloway presents to the  Nicole Holloway.  She recently received a translated into Spanish Survivorship Care Plan.  After having time to read that, she decided to come into the Holloway for follow up.  She does not speak any English, and translator Nicole Holloway is present and translating during Holloway's appointment.  Nicole Holloway is doing very well.  She does take Tamoxifen daily and has been since September, and she other than some occasional joint aches she is not having any difficulties with it.  She did note with her last two menstrual cycles, she had a headache prior to her cycle accompanied by nausea.  She is going to discuss this with her GYN at her appointment next week.  She is seeing Nicole Holloway.  She says her surgeon is Nicole Holloway, and she isn't sure when follow up is with him.  Since completing her radiation, she has noted that she is more sensitive or tearful than usual.  She is slightly more sad at times, but denies anhedonia, sleep pattern changes, increased fatigue, hopelessness, suicidal thoughts or any other depressive type symptoms.  She denies any anxiety, overwhelming thoughts, or moodiness.  She does not have any further questions or concerns Holloway.      REVIEW OF SYSTEMS:  Breast: Denies any new nodularity, masses, tenderness, nipple changes, or nipple discharge.  A 14-point review of systems was completed and was negative, except as noted above.  ONCOLOGY TREATMENT TEAM:  1. Surgeon:  Dr. Cornett at [facility] 2. Medical Oncologist: Nicole Holloway 3. Radiation Oncologist: Nicole Holloway   PAST MEDICAL/SURGICAL HISTORY:  Past Medical History:  Diagnosis Date  . Abnormal mammogram with microcalcification 07/03/2014   Left Breast calcifications  . Breast cancer ([facility])   . Constipation   . Elevated liver enzymes 06/09/2014   AST resolved, ALT almost normal on recheck 07/2014, Abdominal Ultrasound 09/05/2014 with [facility] showed Hepatic steatosis  . External hemorrhoids     . Hepatic steatosis 09/05/2014   With elevated liver enzymes  . History of radiation therapy 08/03/15- 08/28/15   Right Breast  . Hyperglycemia 06/09/2014   101 fasting;  repeat 95  . Hyperlipidemia 06/04/2014  . Seasonal allergies   . Varicose veins of both lower extremities    Past Surgical History:  Procedure Laterality Date  . BREAST BIOPSY Right 05/14/2015  . BREAST LUMPECTOMY WITH RADIOACTIVE SEED AND SENTINEL LYMPH NODE BIOPSY Right 06/16/2015   Procedure: BREAST LUMPECTOMY WITH RADIOACTIVE SEED AND SENTINEL LYMPH NODE BIOPSY;  Surgeon: Nicole Cornett, MD;  Location: [facility];  Service: General;  Laterality: Right;  . CESAREAN SECTION       ALLERGIES:  No Known Allergies   CURRENT MEDICATIONS:  Outpatient Encounter Prescriptions as of 02/18/2016  Medication Sig  . OVER THE COUNTER MEDICATION Over the counter medication for allergies  . tamoxifen (NOLVADEX) 20 MG tablet Take 1 tablet (20 mg total) by mouth daily.  . triamcinolone cream (KENALOG) 0.1 % Apply to affected areas 2 times daily as needed   No facility-administered encounter medications on file as of 02/18/2016.      ONCOLOGIC FAMILY HISTORY:  Family History  Problem Relation Age of Onset  . Diabetes Father   . Other Father     prostate issues  . Other Sister     cervical issues - maybe dysplasia  . Other Maternal Aunt     hx benign breast lump  . Kidney failure Maternal Grandmother     d. 70s; diabetes  . Diabetes Maternal Grandmother      GENETIC COUNSELING/TESTING: Genetic testing did not identify a deleterious mutation.  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 Genes tested include: APC, ATM, BARD1, BMPR1A, BRCA1, BRCA2, BRIP1, CHD1, CDK4, CDKN2A, CHEK2, EPCAM (large rearrangement only), GREM1, MLH1, MSH2, MSH6, MUTYH, NBN, PALB2, PMS2, POLD1, POLE, PTEN, RAD51C, RAD51D, SMAD4, STK11, and  TP53.  SOCIAL HISTORY:  Nicole Holloway is married and lives with her husband, and two children in [place], [place].  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  She denies any current or history of tobacco, alcohol, or illicit drug use.     PHYSICAL EXAMINATION:  Vital Signs:   Vitals:   02/18/16 1035  BP: (!) 99/56  Pulse: 71  Resp: 17  Temp: 98.1 F (36.7 C)   Filed Weights   02/18/16 1035  Weight: 166 lb 6.4 oz (75.5 kg)   General: Well-nourished, well-appearing female in no acute distress.   HEENT: Head is normocephalic.  Pupils equal and reactive to light. Conjunctivae clear without exudate.  Sclerae anicteric. Oral mucosa is pink, moist.  Oropharynx is pink without lesions or erythema.  Lymph: No cervical, supraclavicular, or infraclavicular lymphadenopathy noted on palpation.  Cardiovascular: Regular rate and rhythm.. Respiratory: Clear to auscultation   bilaterally. Chest expansion symmetric; breathing non-labored.  GI: Abdomen soft and round; non-tender, non-distended. Bowel sounds normoactive.  GU: Deferred.  Neuro: No focal deficits. Steady gait.  Psych: Mood and affect normal and appropriate for situation.  Extremities: No edema. FROM in bilateral upper extremities.   Skin: Warm and dry. No nail dyscrasia.  LABORATORY DATA:  None for this visit.  DIAGNOSTIC IMAGING:  None for this visit.      ASSESSMENT AND PLAN:  Ms.. Wilson Holloway is a pleasant 46 y.o. female with Stage IIA right breast invasive ductal carcinoma, ER+/PR+/HER2-, diagnosed in 2017, treated with lumpectomy, adjuvant radiation therapy, and anti-estrogen therapy with Tamoxifen beginning in September, 2017.  She presents to the Survivorship Holloway for our initial Holloway and routine follow-up post-completion of treatment for breast cancer.    1. Stage IIA right breast cancer:  Nicole Holloway is continuing to recover  from definitive treatment for breast cancer. She will follow-up with her medical oncologist, Dr. Lindi Adie in April, 2017 with history and physical exam per surveillance protocol.  She will continue her anti-estrogen therapy with Tamoxifen. Thus far, she is tolerating the Tamoxifen well, with minimal side effects. She was instructed to make Dr. Lindi Adie or myself aware if she begins to experience any worsening side effects of the medication and I could see her back in Holloway to help manage those side effects, as needed. Though the incidence is low, there is an associated risk of endometrial cancer with anti-estrogen therapies like Tamoxifen.  Nicole Holloway was encouraged to contact Dr. Lindi Adie or myself with any heavy menses, or change in menses while taking Tamoxifen. Other side effects of Tamoxifen were again reviewed with her as well. Holloway, a comprehensive survivorship care plan and treatment summary was briefly reviewed with the patient Holloway detailing her breast cancer diagnosis, treatment course, potential late/long-term effects of treatment, appropriate follow-up care with recommendations for the future, and patient education resources.  2. Bone health:  Given Nicole Holloway's age/history of breast cancer she is slightly at risk for bone demineralization.  She was encouraged to increase her consumption of foods rich in calcium, as well as increase her weight-bearing activities.  She was given education on specific activities to promote bone health.  3. Cancer screening:  Due to Nicole Holloway's history and her age, she should receive screening for skin cancers, colon cancer, and gynecologic cancers.  The information and recommendations are listed on the patient's comprehensive care plan/treatment summary and were reviewed in detail with the patient.    4. Health maintenance and wellness promotion: Nicole Holloway was encouraged to consume 5-7 servings of fruits and vegetables per day. She does  exercise daily and was encouraged to continue doing this.     #. Support services/counseling: It is not uncommon for this period of the patient's cancer care trajectory to be one of many emotions and stressors.   Nicole Holloway should contact Gregary Signs should she have any increased stress or feel the need for counseling or support.  We can certainly make the necessary arrangements.   Dispo:   -Return to cancer center in April, 2019 for appointment with Dr. Lindi Adie. -She is welcome to return back to the Survivorship Holloway at any time; no additional follow-up needed at this time.  -Consider referral back to survivorship as a long-term survivor for continued surveillance  A total of (45) minutes of face-to-face time was spent with this patient with greater than 50% of that time in counseling and care-coordination.   Charlestine Massed, NP  [place] [phone]   Note: PRIMARY CARE PROVIDER [place], [place] [phone]

## 2016-02-25 ENCOUNTER — Encounter: Payer: Self-pay | Admitting: Internal Medicine

## 2016-02-25 ENCOUNTER — Ambulatory Visit (INDEPENDENT_AMBULATORY_CARE_PROVIDER_SITE_OTHER): Payer: Self-pay | Admitting: Internal Medicine

## 2016-02-25 VITALS — BP 110/70 | HR 66 | Resp 12 | Ht 61.0 in | Wt 166.0 lb

## 2016-02-25 DIAGNOSIS — L858 Other specified epidermal thickening: Secondary | ICD-10-CM

## 2016-02-25 DIAGNOSIS — L309 Dermatitis, unspecified: Secondary | ICD-10-CM

## 2016-02-25 DIAGNOSIS — Z124 Encounter for screening for malignant neoplasm of cervix: Secondary | ICD-10-CM

## 2016-02-25 DIAGNOSIS — Z0000 Encounter for general adult medical examination without abnormal findings: Secondary | ICD-10-CM

## 2016-02-25 DIAGNOSIS — Z170 Estrogen receptor positive status [ER+]: Secondary | ICD-10-CM

## 2016-02-25 DIAGNOSIS — Z23 Encounter for immunization: Secondary | ICD-10-CM

## 2016-02-25 DIAGNOSIS — N898 Other specified noninflammatory disorders of vagina: Secondary | ICD-10-CM

## 2016-02-25 DIAGNOSIS — C50411 Malignant neoplasm of upper-outer quadrant of right female breast: Secondary | ICD-10-CM

## 2016-02-25 DIAGNOSIS — E669 Obesity, unspecified: Secondary | ICD-10-CM

## 2016-02-25 DIAGNOSIS — H547 Unspecified visual loss: Secondary | ICD-10-CM

## 2016-02-25 DIAGNOSIS — Z6831 Body mass index (BMI) 31.0-31.9, adult: Secondary | ICD-10-CM

## 2016-02-25 LAB — POCT WET PREP WITH KOH
KOH Prep POC: NEGATIVE
RBC WET PREP PER HPF POC: NEGATIVE
TRICHOMONAS UA: NEGATIVE
WBC Wet Prep HPF POC: NEGATIVE
Yeast Wet Prep HPF POC: NEGATIVE

## 2016-02-25 MED ORDER — CLOBETASOL PROP EMOLLIENT BASE 0.05 % EX CREA: TOPICAL_CREAM | CUTANEOUS | 3 refills | Status: DC

## 2016-02-25 NOTE — Patient Instructions (Addendum)
Clobetasol to affected skin Eucerin cream with eczema relief all over    Tome un vaso de agua antes de cada comida Tome un minimo de 6 a 8 vasos de agua diarios Coma tres veces al dia Coma una proteina y Nicole Holloway grasa saludable con comida.  (huevos, pescado, pollo, pavo, y limite carnes rojas Coma 5 porciones diarias de legumbres.  Mezcle los colores Coma 2 porciones diarias de frutas con cascara cuando sea comestible Use platos pequeos Suelte su tenedor o cuchara despues de cada mordida hata que se mastique y se trague Come en la mesa con amigos o familiares por lo menos una vez al dia Apague la televisin y aparatos electrnicos durante la comida  Su objetivo debe ser perder Nicole Holloway libra por semana

## 2016-02-25 NOTE — Progress Notes (Signed)
Subjective:    Patient ID: Nicole Holloway, female    DOB: Oct 06, 1970, 46 y.o.   MRN: [id number]  HPI   CPE with pap  1.  Pap:  Last 2012, normal.  Sounds like always normal.  2.  Mammogram:  Right Breast Cancer diagnosed in April 2017.  ER +, PR +, HER-@ negative.  T1b N0 stage IA clinical stage, no deleterious genetic mutation, though variant of uncertain significance.   Did undergo surgery/lumpectomy, followed radiation. She is taking Tamoxifen.   Having regular periods.  No problems.  Having hot flashes, night sweats.  Next Mammogram scheduled for April.     3.  Osteoprevention: Does not drink milk, but does eat cheese and yogurt daily.  Has 3 servings of dairy daily.   No soy products.  Has never had DEXA.  Not very physically active currently due to cold weather.  4.  Guaiac Cards:  Never.  No family history of colon cancer  5.  Colonoscopy:  Never  6.  Immunizations: Immunization History  Administered Date(s) Administered  . Influenza,inj,Quad PF,36+ Mos 02/25/2015  . Influenza-Unspecified 12/09/2015   Current Meds  Medication Sig  . tamoxifen (NOLVADEX) 20 MG tablet Take 1 tablet (20 mg total) by mouth daily.  Marland Kitchen triamcinolone cream (KENALOG) 0.1 % Apply to affected areas 2 times daily as needed    No Known Allergies   Past Medical History:  Diagnosis Date  . Abnormal mammogram with microcalcification 07/03/2014   Left Breast calcifications  . Breast cancer ([place])   . Breast cancer ([place]) 04/2015   surgery, radiation, Tamoxifen  . Constipation   . Elevated liver enzymes 06/09/2014   AST resolved, ALT almost normal on recheck 07/2014, Abdominal Ultrasound 09/05/2014 with [facility] showed Hepatic steatosis  . External hemorrhoids   . Hepatic steatosis 09/05/2014   With elevated liver enzymes  . History of radiation therapy 08/03/15- 08/28/15   Right Breast  . Hyperglycemia 06/09/2014   101 fasting;  repeat 95  . Hyperlipidemia 06/04/2014  . Seasonal allergies   .  Varicose veins of both lower extremities      Review of Systems  Constitutional: Negative for activity change, appetite change and fatigue.  HENT: Negative for ear pain, hearing loss and sore throat.   Eyes: Positive for visual disturbance.       Hard time reading up close. Has not tried reading glasses  Respiratory: Negative for cough and shortness of breath.   Cardiovascular: Negative for chest pain, palpitations and leg swelling.  Gastrointestinal: Positive for constipation. Negative for abdominal pain, blood in stool and diarrhea.       No melena  Endocrine: Negative for polyuria.  Genitourinary: Negative for dysuria, genital sores and vaginal discharge.  Musculoskeletal: Negative for arthralgias.  Skin:       Eczematous lesion continue to return, but if uses Triamcinolone cream, improves. Would like to try something stronger. Using Curel cream twice daily as well.  Neurological: Negative for weakness and numbness.  Hematological: Negative for adenopathy. Does not bruise/bleed easily.  Psychiatric/Behavioral: Negative for dysphoric mood and suicidal ideas. The patient is not nervous/anxious.        Patient and family doing okay with diagnosis and treatment of her breast cancer, but would like some counseling.       Objective:   Physical Exam  Constitutional: She is oriented to person, place, and time. She appears well-developed and well-nourished.  HENT:  Head: Normocephalic and atraumatic.  Right Ear: Hearing, tympanic membrane, external  ear and ear canal normal.  Left Ear: Hearing, tympanic membrane, external ear and ear canal normal.  Nose: Nose normal.  Mouth/Throat: Uvula is midline and oropharynx is clear and moist.  Eyes: Conjunctivae and EOM are normal. Pupils are equal, round, and reactive to light.  Discs sharp bilaterally  Neck: Normal range of motion. Neck supple. No thyroid mass and no thyromegaly present.  Cardiovascular: Normal rate, regular rhythm, S1  normal and S2 normal.  Exam reveals no S3, no S4 and no friction rub.   No murmur heard. No carotid bruits.  Carotid, radial, femoral, DP and PT pulses normal and equal.  Varicosities of bilateral lower extremities.  Pulmonary/Chest: Effort normal and breath sounds normal. Right breast exhibits no inverted nipple, no mass, no nipple discharge and no tenderness. Left breast exhibits no inverted nipple, no mass, no nipple discharge, no skin change and no tenderness.  Well healed surgical scar, curvilinear to right lateral upper areolar area.  Abdominal: Soft. Bowel sounds are normal. She exhibits no mass. There is no hepatosplenomegaly. There is no tenderness. No hernia. Hernia confirmed negative in the right inguinal area and confirmed negative in the left inguinal area.  Genitourinary: Rectal exam shows guaiac negative stool. There is no rash, tenderness or lesion on the right labia. There is no rash, tenderness or lesion on the left labia. No erythema or tenderness in the vagina. Vaginal discharge found.  Musculoskeletal: Normal range of motion.  Lymphadenopathy:       Head (right side): No submental and no submandibular adenopathy present.       Head (left side): No submental and no submandibular adenopathy present.    She has no cervical adenopathy.    She has no axillary adenopathy.       Right: No inguinal and no supraclavicular adenopathy present.       Left: No inguinal and no supraclavicular adenopathy present.  Neurological: She is alert and oriented to person, place, and time. She has normal strength and normal reflexes. No cranial nerve deficit or sensory deficit. Coordination and gait normal.  Skin: Skin is warm and dry. Rash noted. No lesion noted.  Dry papular rash on upper outer arms,posterior elbows.  Psychiatric: She has a normal mood and affect. Her speech is normal and behavior is normal. Judgment and thought content normal. Cognition and memory are normal.            Assessment & Plan:  1.  CPE with pap Tdap CBC, CMP, FLP, Wet prep With use of  Tamoxifen, see if can obtain DEXA via orange card. Mammogram scheduled for April.  2.  Decreased Visual Acuity:  Optometry appt.  3.  Obesity: discussed BMI and goal to get under 135 lbs with diet and physical activity.  4.  Keratosis Pilaris:  Change to Clobetasol from Triamcinolone cream for flares.  Eucerin Cream all over twice daily as well.  Dove soap.

## 2016-02-26 LAB — COMPREHENSIVE METABOLIC PANEL
A/G RATIO: 1.7 (ref 1.2–2.2)
ALBUMIN: 4.5 g/dL (ref 3.5–5.5)
ALT: 26 IU/L (ref 0–32)
AST: 18 IU/L (ref 0–40)
Alkaline Phosphatase: 60 IU/L (ref 39–117)
BUN / CREAT RATIO: 13 (ref 9–23)
BUN: 9 mg/dL (ref 6–24)
Bilirubin Total: 0.4 mg/dL (ref 0.0–1.2)
CALCIUM: 9.2 mg/dL (ref 8.7–10.2)
CO2: 22 mmol/L (ref 18–29)
CREATININE: 0.71 mg/dL (ref 0.57–1.00)
Chloride: 105 mmol/L (ref 96–106)
GFR calc Af Amer: 120 mL/min/{1.73_m2} (ref >59)
GFR, EST NON AFRICAN AMERICAN: 104 mL/min/{1.73_m2} (ref >59)
GLOBULIN, TOTAL: 2.7 g/dL (ref 1.5–4.5)
Glucose: 87 mg/dL (ref 65–99)
POTASSIUM: 4.2 mmol/L (ref 3.5–5.2)
SODIUM: 144 mmol/L (ref 134–144)
Total Protein: 7.2 g/dL (ref 6.0–8.5)

## 2016-02-26 LAB — CBC WITH DIFFERENTIAL/PLATELET
BASOS: 0 %
Basophils Absolute: 0 10*3/uL (ref 0.0–0.2)
EOS (ABSOLUTE): 0.1 10*3/uL (ref 0.0–0.4)
EOS: 1 %
HEMATOCRIT: 42.1 % (ref 34.0–46.6)
Hemoglobin: 14 g/dL (ref 11.1–15.9)
IMMATURE GRANULOCYTES: 0 %
Immature Grans (Abs): 0 10*3/uL (ref 0.0–0.1)
LYMPHS ABS: 1.7 10*3/uL (ref 0.7–3.1)
Lymphs: 32 %
MCH: 30.5 pg (ref 26.6–33.0)
MCHC: 33.3 g/dL (ref 31.5–35.7)
MCV: 92 fL (ref 79–97)
MONOS ABS: 0.5 10*3/uL (ref 0.1–0.9)
Monocytes: 9 %
NEUTROS ABS: 3.1 10*3/uL (ref 1.4–7.0)
NEUTROS PCT: 58 %
PLATELETS: 202 10*3/uL (ref 150–379)
RBC: 4.59 x10E6/uL (ref 3.77–5.28)
RDW: 13.3 % (ref 12.3–15.4)
WBC: 5.3 10*3/uL (ref 3.4–10.8)

## 2016-02-26 LAB — LIPID PANEL W/O CHOL/HDL RATIO
Cholesterol, Total: 169 mg/dL (ref 100–199)
HDL: 40 mg/dL (ref >39)
LDL Calculated: 92 mg/dL (ref 0–99)
Triglycerides: 184 mg/dL — ABNORMAL HIGH (ref 0–149)
VLDL Cholesterol Cal: 37 mg/dL (ref 5–40)

## 2016-02-26 LAB — CYTOLOGY - PAP

## 2016-03-04 ENCOUNTER — Other Ambulatory Visit: Payer: Self-pay

## 2016-03-04 ENCOUNTER — Telehealth: Payer: Self-pay | Admitting: Internal Medicine

## 2016-03-04 MED ORDER — CLOBETASOL PROP EMOLLIENT BASE 0.05 % EX CREA: TOPICAL_CREAM | CUTANEOUS | 3 refills | Status: DC

## 2016-03-04 NOTE — Telephone Encounter (Signed)
Patient had a cream (clobetasol) that was sent to [facility] on [address]but costs $70 and was too much for her.She would like that prescription to be transferred over to [facility] where it would be cheaper.

## 2016-03-04 NOTE — Telephone Encounter (Signed)
noted 

## 2016-03-04 NOTE — Telephone Encounter (Signed)
Spoke with [facility] pharmacist they do not carry Clobetasol cream. Please inform patient.

## 2016-03-04 NOTE — Telephone Encounter (Signed)
Patient was notified about medication Clobetasol not being available at [facility] and that she must pick up at [facility] if she needs it.

## 2016-03-04 NOTE — Telephone Encounter (Signed)
Patient was notified of her medication was transferred to [facility]

## 2016-03-04 NOTE — Telephone Encounter (Signed)
Please call patient and inform them that Rx was sent to [facility]

## 2016-03-10 ENCOUNTER — Ambulatory Visit (INDEPENDENT_AMBULATORY_CARE_PROVIDER_SITE_OTHER): Payer: Self-pay | Admitting: Licensed Clinical Social Worker

## 2016-03-10 DIAGNOSIS — F32A Depression, unspecified: Secondary | ICD-10-CM

## 2016-03-10 DIAGNOSIS — F329 Major depressive disorder, single episode, unspecified: Secondary | ICD-10-CM

## 2016-03-11 NOTE — Progress Notes (Signed)
   THERAPY PROGRESS NOTE  Session Time: 67min  Participation Level: Active  Behavioral Response: Neat and Well GroomedAlertDepressed  Type of Therapy: Individual Therapy  Treatment Goals addressed: Coping  Interventions: Motivational Interviewing and Supportive  Summary: Nicole Holloway is a 46 y.o. female who presents with a depressed mood and appropriate affect. She shared that she was seeking counseling because of the stress that she has undergone over the past year or two. She reported that she was very "sensitive" and cried often. Nicole Holloway shared about her difficulties with her adult son who lives with her, who has had drug involvement and a psychotic episode that landed him in the hospital last year. She stated that while he stopped using his prescribed medications, he "seems totally better."  Nicole Holloway shared that one year ago she was diagnosed with breast cancer and is currently in treatment. She reported symptoms including depressed mood, anhedonia, sleep disturbance, and fatigue. She completed the trauma history and did not have any significant traumas. She denied any substance abuse. Nicole Holloway shared that she needs support because the cancer diagnosis has been so frightening and stressful for her.  Suicidal/Homicidal: Nowithout intent/plan  Therapist Response: LCSW utilized supportive counseling techniques throughout the session in order to validate emotions and encourage open expression of emotion. LCSW began the clinical assessment but was unable to finish due to time constraints. LCSW provided psychoeducation regarding bipolar disorder and the need to stay in consistent treatment (for son). LCSW and Nicole Holloway processed about her current stress and difficult emotions.  Plan: Return again in 1 weeks.  Diagnosis: Axis I: See current hospital problem list    Axis II: No diagnosis    [name], LCSW 03/11/2016

## 2016-03-16 NOTE — Progress Notes (Signed)
Patient was called with labs results. She says she has rash on both of her hands and left foot with little bumps. She is very itchy and uses over the counter creams to help with the itch but only helps very little. She says Dr. Amil Amen said if the rash continued she should call back and see about getting an appointment with a dermotologiost. Patient goes to [facility] and she was recently diagnosed with cancer a year ago in April and wants to know if she should get a referral from Dr. Amil Amen to have a mammogram done.

## 2016-03-18 ENCOUNTER — Ambulatory Visit (INDEPENDENT_AMBULATORY_CARE_PROVIDER_SITE_OTHER): Payer: Self-pay | Admitting: Licensed Clinical Social Worker

## 2016-03-18 DIAGNOSIS — F3289 Other specified depressive episodes: Secondary | ICD-10-CM
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
## 2016-03-24 ENCOUNTER — Telehealth: Payer: Self-pay | Admitting: Internal Medicine

## 2016-03-24 NOTE — Telephone Encounter (Signed)
Patient would like to have a referral made to see a dermatologist. Patient says Dr. Amil Amen told her to call back if the bumps or rash on her arms and legs returned or got worse.

## 2016-03-24 NOTE — Telephone Encounter (Signed)
To Dr. Mulberry for further direction 

## 2016-03-25 MED FILL — TAMOXIFEN 20 MG TABLET: 20 | 90 days supply | Qty: 90 | Fill #2

## 2016-03-28 NOTE — Telephone Encounter (Signed)
Needs a visit with me first

## 2016-04-01 ENCOUNTER — Ambulatory Visit (INDEPENDENT_AMBULATORY_CARE_PROVIDER_SITE_OTHER): Payer: Self-pay | Admitting: Licensed Clinical Social Worker

## 2016-04-01 ENCOUNTER — Encounter: Payer: Self-pay | Admitting: Internal Medicine

## 2016-04-01 ENCOUNTER — Telehealth: Payer: Self-pay | Admitting: Internal Medicine

## 2016-04-01 DIAGNOSIS — F3289 Other specified depressive episodes: Secondary | ICD-10-CM

## 2016-04-04 NOTE — Progress Notes (Signed)
   THERAPY PROGRESS NOTE  Session Time: 71min  Participation Level: Active  Behavioral Response: Neat and Well GroomedAlertEuthymic  Type of Therapy: Individual Therapy  Treatment Goals addressed: Coping  Interventions: Supportive  Summary: Nicole Holloway is a 46 y.o. female who presents with a euthymic mood and appropriate affect. She shared that things were going well for her. Shalini participated in creating an Ecomap to reflect her most important life domains, including her husband, son, daughter, health, dreams for her children, traveling, family, learning English, and her parents. She shared about each life domain in detail. She reported that her marriage is very positive and stable, and that her husband is a good person. She shared that her relationship with her son Elita Quick has been rocky in the past but she sees that he is maturing a lot. She shared that her relationship with her daughter is overall positive but can be difficult due to her daughter's rebellious behaviors at times. She tearfully described how good she is feeling about her health now that she is finished with her breast cancer treatments. She expressed that she has many dreams for her children but that she cannot influence them completely, as they have to find their own path.   Suicidal/Homicidal: Nowithout intent/plan  Therapist Response: LCSW utilized supportive counseling techniques throughout the session in order to validate emotions and encourage open expression of emotion. LCSW completed an Ecomap with [name] in order to assess life domains, supports, and stressors. LCSW and [name] processed about each life domain identified on the map.  Plan: Return again in 2 weeks.  Diagnosis: Axis I: See current hospital problem list    Axis II: No diagnosis    [name], LCSW 04/04/2016

## 2016-04-14 ENCOUNTER — Other Ambulatory Visit: Payer: Self-pay | Admitting: Licensed Clinical Social Worker

## 2016-04-19 NOTE — Telephone Encounter (Signed)
Patient would like to know how much it would cost out of pocket to see if she can afford it. Please advise

## 2016-04-20 NOTE — Telephone Encounter (Signed)
To Dr. Mulberry for further direction 

## 2016-04-21 ENCOUNTER — Ambulatory Visit (INDEPENDENT_AMBULATORY_CARE_PROVIDER_SITE_OTHER): Payer: Self-pay | Admitting: Licensed Clinical Social Worker

## 2016-04-21 DIAGNOSIS — F3289 Other specified depressive episodes: Secondary | ICD-10-CM

## 2016-04-22 NOTE — Progress Notes (Signed)
   THERAPY PROGRESS NOTE  Session Time: 44min  Participation Level: Active  Behavioral Response: Neat and Well GroomedAlertEuthymic  Type of Therapy: Individual Therapy  Treatment Goals addressed: Coping  Interventions: Motivational Interviewing and Supportive  Summary: Nicole Holloway is a 46 y.o. female who presents with a slightly depressed mood and appropriate affect. She reported that she has been stressed lately due to a bug infestation in her bedroom. She shared that she has had to throw away her mattress, pillows, and bedding in an effort to get rid of the bugs. She expressed hope that the infestation was the cause of her skin problems, which have been getting better in the past few days. Nicole Holloway completed her Ecomap by sharing about the final life domains. She shared that she has dreams to travel to other cities and states because she has barely left [place] since moving here. She became tearful as she shared about her extended family, as there has been conflict between one of her brothers and her parents. She reported that she is frustrated by her seeming inability to learn [place], even after taking classes. She shared about her relationship with her parents, which is very positive, though she feels a lot of sadness that they are having health problems and are far away from her.   Suicidal/Homicidal: Nowithout intent/plan  Therapist Response: LCSW utilized supportive counseling techniques throughout the session in order to validate emotions and encourage open expression of emotion. LCSW completed an Ecomap with Nicole Holloway in order to assess life domains, supports, and stressors. LCSW and Nicole Holloway processed about each life domain identified on the map. LCSW and Nicole Holloway discussed LCSW upcoming maternity leave and her options; Nicole Holloway will decide and let LCSW know what she wants to do.  Plan: Return again in 2 weeks.  Diagnosis: Axis I: See current hospital problem list    Axis II: No  diagnosis    [name], LCSW 04/22/2016

## 2016-04-25 ENCOUNTER — Encounter: Payer: Self-pay | Admitting: Hematology and Oncology

## 2016-04-25 ENCOUNTER — Telehealth: Payer: Self-pay | Admitting: Hematology and Oncology

## 2016-04-25 ENCOUNTER — Ambulatory Visit (HOSPITAL_BASED_OUTPATIENT_CLINIC_OR_DEPARTMENT_OTHER): Payer: Self-pay | Admitting: Hematology and Oncology

## 2016-04-25 VITALS — BP 93/54 | HR 69 | Temp 98.6°F | Resp 18 | Ht 61.0 in | Wt 169.7 lb

## 2016-04-25 DIAGNOSIS — C50411 Malignant neoplasm of upper-outer quadrant of right female breast: Secondary | ICD-10-CM

## 2016-04-25 DIAGNOSIS — R21 Rash and other nonspecific skin eruption: Secondary | ICD-10-CM

## 2016-04-25 DIAGNOSIS — Z79810 Long term (current) use of selective estrogen receptor modulators (SERMs): Secondary | ICD-10-CM

## 2016-04-25 DIAGNOSIS — Z170 Estrogen receptor positive status [ER+]: Secondary | ICD-10-CM

## 2016-04-25 DIAGNOSIS — L309 Dermatitis, unspecified: Secondary | ICD-10-CM

## 2016-04-25 NOTE — Telephone Encounter (Signed)
Gave patient avs report and appointment for April 2019. Patient also given appointment at [facility] for tomorrow 4/10 @ 3:45 pm to arrive 3:30 pm. Patient given date/time/location/phone via interpreter. Patient also aware of $79.00 fee due at time of service.

## 2016-04-25 NOTE — Progress Notes (Signed)
Patient Care Team: Mack Hook, MD as PCP - General (Internal Medicine)  DIAGNOSIS:  Encounter Diagnosis  Name Primary?  . Malignant neoplasm of upper-outer quadrant of right breast in female, estrogen receptor positive ([place])     SUMMARY OF ONCOLOGIC HISTORY:   Breast cancer of upper-outer quadrant of right female breast ([place])   05/14/2015 Initial Diagnosis    Right breast biopsy 10:00: IDC with DCIS, grade 1, ER 90%, PR 80%, Ki-67 3%, HER-2 negative ratio 0.93; right breast distortion on mammogram at 10:00 6 x 4 x 4 mm ultrasound axilla negative, T1b N0 stage IA clinical stage      05/28/2015 Genetic Testing    Genetic testing did not identify a deleterious mutation.  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 Genes tested include: APC, ATM, BARD1, BMPR1A, BRCA1, BRCA2, BRIP1, CHD1, CDK4, CDKN2A, CHEK2, EPCAM (large rearrangement only), GREM1, MLH1, MSH2, MSH6, MUTYH, NBN, PALB2, PMS2, POLD1, POLE, PTEN, RAD51C, RAD51D, SMAD4, STK11, and TP53      06/08/2015 Procedure    Genetic testing: VUS on STK11 gene (considered negative). Genes analyzed:  APC, ATM, BARD1, BMPR1A, BRCA1, BRCA2, BRIP1, CHD1, CDK4, CDKN2A, CHEK2, EPCAM (large rearrangement only), GREM1, MLH1, MSH2, MSH6, MUTYH, NBN, PALB2, PMS2, POLD1, POLE, PTEN, RAD51C, RAD51D, SMAD4, STK11, and TP53      06/16/2015 Surgery    Right lumpectomy ([facility]): IDC with DCIS, 2.2 cm, 0/3 sentinel nodes negative, T2 N0 stage II a, ER 90%, PR 80%, Ki-67 3%, HER-2 negative ratio 0.93 , Oncotype DX score 10, 7% ROR      06/16/2015 Oncotype testing    Recurrence score: 10; ROR 7% (low-risk)       08/03/2015 - 08/28/2015 Radiation Therapy    Adjuvant radiation therapy Isidore Moos). The Right breast was treated to 40.05 Gy in 15 fractions at 2.67 Gy per fraction. Right breast was boosted to 10 Gy in 5 fractions at 2 Gy per fraction.      09/28/2015  -  Anti-estrogen oral therapy    Tamoxifen 20 mg daily       CHIEF COMPLIANT: Continued skin itching   INTERVAL HISTORY: Nicole Holloway is a 46 year old with above-mentioned history of right breast cancer treated with lumpectomy and radiation and is currently on tamoxifen therapy. She has been tolerating tamoxifen fairly well without any hot flashes or myalgias. She complained of a rash on her body which did not get better even after stopping tamoxifen. She continues to have diffuse itching of the skin periodically throughout the day. She also has a rough sensation of the skin.  REVIEW OF SYSTEMS:   Constitutional: Denies fevers, chills or abnormal weight loss Eyes: Denies blurriness of vision Ears, nose, mouth, throat, and face: Denies mucositis or sore throat Respiratory: Denies cough, dyspnea or wheezes Cardiovascular: Denies palpitation, chest discomfort Gastrointestinal:  Denies nausea, heartburn or change in bowel habits Skin: Severe itching diffusely throughout her skin Lymphatics: Denies new lymphadenopathy or easy bruising Neurological:Denies numbness, tingling or new weaknesses Behavioral/Psych: Mood is stable, no new changes  Extremities: No lower extremity edema Breast:  denies any pain or lumps or nodules in either breasts All other systems were reviewed with the patient and are negative.  I have reviewed the past medical history, past surgical history, social history and family history with the patient and they are unchanged from previous note.  ALLERGIES:  has No Known Allergies.  MEDICATIONS:  Current Outpatient Prescriptions  Medication Sig Dispense Refill  . Clobetasol Prop Emollient Base 0.05 % emollient cream Apply to affected areas twice daily as needed 30 g 3  . tamoxifen (NOLVADEX) 20 MG tablet Take 1 tablet (20 mg total) by mouth daily. 90 tablet 3  . triamcinolone cream (KENALOG) 0.1 % Apply to affected areas 2 times daily as needed 80 g 1   No  current facility-administered medications for this visit.     PHYSICAL EXAMINATION: ECOG PERFORMANCE STATUS: 1 - Symptomatic but completely ambulatory  Vitals:   04/25/16 1406  BP: (!) 93/54  Pulse: 69  Resp: 18  Temp: 98.6 F (37 C)   Filed Weights   04/25/16 1406  Weight: 169 lb 11.2 oz (77 kg)    GENERAL:alert, no distress and comfortable SKIN: skin color, texture, turgor are normal, no rashes or significant lesions EYES: normal, Conjunctiva are pink and non-injected, sclera clear OROPHARYNX:no exudate, no erythema and lips, buccal mucosa, and tongue normal  NECK: supple, thyroid normal size, non-tender, without nodularity LYMPH:  no palpable lymphadenopathy in the cervical, axillary or inguinal LUNGS: clear to auscultation and percussion with normal breathing effort HEART: regular rate & rhythm and no murmurs and no lower extremity edema ABDOMEN:abdomen soft, non-tender and normal bowel sounds MUSCULOSKELETAL:no cyanosis of digits and no clubbing  NEURO: alert & oriented x 3 with fluent speech, no focal motor/sensory deficits EXTREMITIES: No lower extremity edema  LABORATORY DATA:  I have reviewed the data as listed   Chemistry      Component Value Date/Time   NA 144 02/25/2016 1029   NA 140 05/27/2015 0827   K 4.2 02/25/2016 1029   K 3.8 05/27/2015 0827   CL 105 02/25/2016 1029   CO2 22 02/25/2016 1029   CO2 27 05/27/2015 0827   BUN 9 02/25/2016 1029   BUN 13.5 05/27/2015 0827   CREATININE 0.71 02/25/2016 1029   CREATININE 0.8 05/27/2015 0827      Component Value Date/Time   CALCIUM 9.2 02/25/2016 1029   CALCIUM 9.2 05/27/2015 0827   ALKPHOS 60 02/25/2016 1029   ALKPHOS 67 05/27/2015 0827   AST 18 02/25/2016 1029   AST 15 05/27/2015 0827   ALT 26 02/25/2016 1029   ALT 27 05/27/2015 0827   BILITOT 0.4 02/25/2016 1029   BILITOT 0.59 05/27/2015 0827       Lab Results  Component Value Date   WBC 5.3 02/25/2016   HGB 13.8 05/27/2015   HCT 42.1  02/25/2016   MCV 92 02/25/2016   PLT 202 02/25/2016   NEUTROABS 3.1 02/25/2016    ASSESSMENT & PLAN:  Breast cancer of upper-outer quadrant of right female breast ([place]) Right lumpectomy 06/16/2015: IDC with DCIS, 2.2 cm, 0/3 sentinel nodes negative, T2 N0 stage II a, ER 90%, PR 80%, Ki-67 3%, HER-2 negative ratio 0.93  Oncotype DX score 10, 7% risk of recurrence, low risk  Current treatment: 1. Adjuvant radiation therapy started 08/03/2015 followed by 2. Adjuvant antiestrogen therapy with tamoxifen 20 mg daily started 09/28/2015  Tamoxifen Toxicities: Denies any hot flashes or myalgias.  Surveillance of breast cancer: Mammograms have been scheduled for 05/19/2016.  Extensive sandpaper like skin rash on her extremities lower back and buttocks: This persisted in spite of stopping tamoxifen. Previously treated with Medrol Dosepak. Dermatology consulted.  Return to clinic in 1 year for follow-up   I spent 25 minutes talking to the patient of which more than half was spent in counseling and coordination of care.  No orders  of the defined types were placed in this encounter.  The patient has a good understanding of the overall plan. she agrees with it. she will call with any problems that may develop before the next visit here.   Rulon Eisenmenger, MD 04/25/16

## 2016-04-25 NOTE — Telephone Encounter (Signed)
Gave patient avs report and appointments for April 2019. Patient also given dermatology appointment - information below.     Dermatology Specialist  04/26/2016 @ 3:45 pm arrive 3:30 pm Marline Backbone, [place] (Dr. Renda Rolls) Spoke with Clarise Cruz Patient aware and given date/time/location and phone number.

## 2016-04-25 NOTE — Assessment & Plan Note (Signed)
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 Adjuvant radiation therapy started 08/03/2015 followed by 2. Adjuvant antiestrogen therapy with tamoxifen 20 mg daily started 09/28/2015  Tamoxifen Toxicities: Denies any hot flashes or myalgias.  Extensive sandpaper like skin rash on her extremities lower back and buttocks: This persisted in spite of stopping tamoxifen. Previously treated with Medrol Dosepak. Dermatology consulted.  Return to clinic in 1 year for follow-up

## 2016-05-06 ENCOUNTER — Other Ambulatory Visit (HOSPITAL_COMMUNITY): Payer: Self-pay | Admitting: *Deleted

## 2016-05-06 DIAGNOSIS — Z853 Personal history of malignant neoplasm of breast: Secondary | ICD-10-CM

## 2016-05-09 ENCOUNTER — Ambulatory Visit (INDEPENDENT_AMBULATORY_CARE_PROVIDER_SITE_OTHER): Payer: Self-pay | Admitting: Licensed Clinical Social Worker

## 2016-05-09 DIAGNOSIS — F3289 Other specified depressive episodes: Secondary | ICD-10-CM

## 2016-05-12 NOTE — Progress Notes (Signed)
   THERAPY PROGRESS NOTE  Session Time: 72min  Participation Level: Active  Behavioral Response: Neat and Well GroomedAlertEuthymic  Type of Therapy: Individual Therapy  Treatment Goals addressed: Coping  Interventions: Strength-based and Supportive  Summary: Nicole Holloway is a 46 y.o. female who presents with a euthymic mood and appropriate affect. She shared that she has been doing well over the past few weeks. She completed a PHQ-9 to assess level of depression symptoms and scored a 0, indicating no depressive symptoms at this time. She shared that she and her husband had sucessfully treated their home for the bug issues and now seem to be bug-free. She reported that she had a dermatologist appointment to check her skill due to ongoing rash issues, and the prescribed cream seems to be working. [name] shared concerns that her right breast is sensitive and painful over the past few days; she agreed that she should check in with her oncologist. [name] became tearful as she described a recent visit to the oncologist's office, as she saw many patients in the waiting room with advanced cancer. She stated that she would like to help others that are not having a recovery as positive as hers. She shared about the intense fears she felt during and after her cancer diagnosis.  Suicidal/Homicidal: Nowithout intent/plan  Therapist Response: LCSW utilized supportive counseling techniques throughout the session in order to validate emotions and encourage open expression of emotion. LCSW administered a PHQ-9 in order to assess the level of current depressive symptoms. LCSW and Jermeka processed about her emotions surrounding the cancer diagnosis and subsequent recovery.  Plan: Return again in 2 weeks.    [name], LCSW 05/12/2016

## 2016-05-19 ENCOUNTER — Ambulatory Visit
Admission: RE | Admit: 2016-05-19 | Discharge: 2016-05-19 | Disposition: A | Discharge status: Home / Self Care | Payer: No Typology Code available for payment source | Source: Ambulatory Visit | Attending: Obstetrics and Gynecology | Admitting: Obstetrics and Gynecology

## 2016-05-19 ENCOUNTER — Ambulatory Visit (HOSPITAL_COMMUNITY)
Admission: RE | Admit: 2016-05-19 | Discharge: 2016-05-19 | Disposition: A | Discharge status: Home / Self Care | Payer: No Typology Code available for payment source | Source: Ambulatory Visit | Attending: Obstetrics and Gynecology | Admitting: Obstetrics and Gynecology

## 2016-05-19 ENCOUNTER — Encounter (HOSPITAL_COMMUNITY): Payer: Self-pay

## 2016-05-19 VITALS — BP 110/72 | Temp 98.9°F | Ht 61.75 in | Wt 168.0 lb

## 2016-05-19 DIAGNOSIS — Z853 Personal history of malignant neoplasm of breast: Secondary | ICD-10-CM

## 2016-05-19 DIAGNOSIS — N644 Mastodynia: Secondary | ICD-10-CM

## 2016-05-19 DIAGNOSIS — Z1239 Encounter for other screening for malignant neoplasm of breast: Secondary | ICD-10-CM

## 2016-05-19 NOTE — Patient Instructions (Addendum)
Explained breast self awareness with Nicole Holloway. Patient did not need a Pap smear today due to last Pap smear was 02/25/2016. Let her know [facility] will cover Pap smears every 3 years unless has a history of abnormal Pap smears. Referred patient to the [facility] for diagnostic mammogram per recommendation for history of lumpectomy for breast cancer. Appointment scheduled for Thursday, May 19, 2016 at 0920. Nicole Holloway verbalized understanding.  [place]ann[place], Arvil Chaco, RN 9:15 AM

## 2016-05-19 NOTE — Progress Notes (Signed)
Complaints of right breast pain at lumpectomy site.    Pap Smear: Pap smear not completed today. Last Pap smear was 02/25/2016 at the [facility] and normal. Per patient has no history of an abnormal Pap smear. Last Pap smear result is in EPIC.  Physical exam: Breasts Left breast slightly larger than right breast due to history of right breast lumpectomy 06/16/2015. No skin abnormalities left breast. Scar observed on right upper outer breast from lumpectomy and skin darkened right breast due to history of radiation. No nipple retraction bilateral breasts. No nipple discharge bilateral breasts. No lymphadenopathy. No lumps palpated bilateral breasts. Complaints of tenderness at lumpectomy site. Referred patient to the [facility] for diagnostic mammogram per recommendation for history of lumpectomy for breast cancer.Appointment scheduled for Thursday, May 19, 2016 at 0920.        Pelvic/Bimanual No Pap smear completed today since last Pap smear was 02/25/2016. Pap smear not indicated per BCCCP guidelines.   Smoking History: Patient has never smoked.  Patient Navigation: Patient education provided. Access to services provided for patient through [facility] program. Spanish interpreter provided.  Used Spanish interpreter [employer] from [facility].

## 2016-05-20 ENCOUNTER — Encounter (HOSPITAL_COMMUNITY): Payer: Self-pay | Admitting: *Deleted

## 2016-05-26 ENCOUNTER — Other Ambulatory Visit: Payer: Self-pay | Admitting: Licensed Clinical Social Worker

## 2016-05-27 ENCOUNTER — Ambulatory Visit (INDEPENDENT_AMBULATORY_CARE_PROVIDER_SITE_OTHER): Payer: Self-pay | Admitting: Licensed Clinical Social Worker

## 2016-05-27 DIAGNOSIS — F3289 Other specified depressive episodes: Secondary | ICD-10-CM

## 2016-05-27 NOTE — Progress Notes (Signed)
   THERAPY PROGRESS NOTE  Session Time: 37min  Participation Level: Active  Behavioral Response: Neat and Well GroomedAlertEuthymic  Type of Therapy: Individual Therapy  Treatment Goals addressed: Coping  Interventions: Strength-based and Supportive  Summary: Nicole Holloway is a 46 y.o. female who presents with a positive mood and appropriate affect. She reported that she has been doing very well lately, especially since having a mammogram that confirmed she is still in remission from cancer. She described the relief that she felt after getting the results from the doctor. Nicole Holloway expressed concerns about her adolescent daughter's behaviors, as she has been more defiant than usual lately. She shared that she is trying to continue having good and open communication with her daughter, regardless of her daughter's behaviors. She shared about the ways that she is trying to talk openly with her daughter. Nicole Holloway became tearful as she shared that she continues to feel very sensitive and cries easily. She expressed worries that this is not normal, but appeared receptive to LCSW feedback about how going through a cancer diagnosis and treatment has changed her perspective on life. Nicole Holloway acknowledged that she is coping well overall but continues to feel sensitive to other people's pain.   Suicidal/Homicidal: Nowithout intent/plan  Therapist Response: LCSW utilized supportive counseling techniques throughout the session in order to validate emotions and encourage open expression of emotion. LCSW normalized [name]'s feelings of sensitivity after going through such an intense diagnosis and treatment. LCSW and [name] processed about how she can best support her daughter through this phase.  Plan: Return again in 2 weeks.   [name], LCSW 05/27/2016

## 2016-06-10 ENCOUNTER — Other Ambulatory Visit: Payer: Self-pay | Admitting: Licensed Clinical Social Worker

## 2016-06-28 MED FILL — TAMOXIFEN 20 MG TABLET: 20 | 90 days supply | Qty: 90 | Fill #3

## 2016-07-30 LAB — GLUCOSE, POCT (MANUAL RESULT ENTRY): POC GLUCOSE: 110 mg/dL — AB (ref 70–99)

## 2016-08-23 NOTE — Telephone Encounter (Signed)
Would hold on DEXA for now.  Her Tamoxifen actually should help prevent osteoporosis somewhat, so do not believe she needs this done at this time.

## 2016-08-23 NOTE — Telephone Encounter (Signed)
Please see previous comment

## 2016-08-24 NOTE — Telephone Encounter (Signed)
To Nicky to notify patient 

## 2016-09-02 ENCOUNTER — Other Ambulatory Visit: Payer: Self-pay | Admitting: Hematology and Oncology

## 2016-09-13 ENCOUNTER — Ambulatory Visit (INDEPENDENT_AMBULATORY_CARE_PROVIDER_SITE_OTHER): Payer: Self-pay | Admitting: Licensed Clinical Social Worker

## 2016-09-13 DIAGNOSIS — F3289 Other specified depressive episodes: Secondary | ICD-10-CM

## 2016-09-13 NOTE — Progress Notes (Signed)
   THERAPY PROGRESS NOTE  Session Time: 62min  Participation Level: Active  Behavioral Response: CasualAlertEuthymic  Type of Therapy: Individual Therapy  Treatment Goals addressed: Coping  Interventions: Motivational Interviewing and Supportive  Summary: Nicole Holloway is a 46 y.o. female who presents with a positive mood and appropriate affect. She reported that she has had many ups and downs over the past few months. She shared that her son recently had another psychotic episode with hallucinations and had to be hospitalized for several days. She stated that she felt it was more due to use of marijuana than his diagnosis of bipolar, which he was not medicating. Kashana expressed her frustrations that her son is refusing to go to therapy, as is her husband. She shared that she feels the whole family needs to go to counseling to address various issues. She expressed concerns that her son and husband have a very tenuous relationship, as her husband struggles to communicate openly with their son. Nicole Holloway shared about how she feels put into the middle, as she tries to keep the peace in the family. She shared that her daughter has become more rebellious. She reported that recently she has turned to religion and God more frequently, which she feels is very helpful in coping with her stress.   Suicidal/Homicidal: Nowithout intent/plan  Therapist Response: LCSW utilized supportive counseling techniques throughout the session in order to validate emotions and encourage open expression of emotion. LCSW and Nicole Holloway processed about challenging family dynamics, particularly between her husband and son. LCSW encouraged Nicole Holloway to support her son as he learns to manage his diagnosis; LCSW emphasized the importance of both medication and counseling. LCSW provided affirmations to [facility] for setting clear boundaries with her daughter.  Plan: Return again in 2 weeks.    [name], LCSW 09/13/2016

## 2016-09-16 MED FILL — TAMOXIFEN 20 MG TABLET: 20 | 90 days supply | Qty: 90 | Fill #0

## 2016-09-27 ENCOUNTER — Ambulatory Visit (INDEPENDENT_AMBULATORY_CARE_PROVIDER_SITE_OTHER): Payer: Self-pay | Admitting: Licensed Clinical Social Worker

## 2016-09-27 DIAGNOSIS — F3289 Other specified depressive episodes: Secondary | ICD-10-CM

## 2016-09-29 NOTE — Progress Notes (Signed)
   THERAPY PROGRESS NOTE  Session Time: 57min  Participation Level: Active  Behavioral Response: Neat and Well GroomedAlertDepressed  Type of Therapy: Individual Therapy  Treatment Goals addressed: Coping  Interventions: Motivational Interviewing and Supportive  Summary: Nicole Holloway is a 46 y.o. female who presents with a depressed mood and appropriate affect. She reported that she is struggling with feelings of frustration towards her husband for not communicating well with their son. She shared that her son recently left the house and spent the night somewhere else without telling her, which was very upsetting. She shared that the problem was intensified because her husband yelled at their son instead of talking with him calmly. She tearfully expressed that her husband has never communicated well with their son and that it hurts her to see their relationship deteriorate. She shared about her difficulty in constantly being in the middle between them, trying to mediate their conflicts. She reported that she has tried to get both husband and son into counseling, but both of them are resistant.  Suicidal/Homicidal: Nowithout intent/plan  Therapist Response: LCSW utilized supportive counseling techniques throughout the session in order to validate emotions and encourage open expression of emotion. LCSW and [name] processed about how she can communicate with her husband about how his behavior affects her. LCSW encouraged [name] to not mediate their conflicts when possible, and to focus on her own self-care instead.  Plan: Return again in 2 weeks.    [name], LCSW 09/29/2016

## 2016-10-24 ENCOUNTER — Ambulatory Visit (INDEPENDENT_AMBULATORY_CARE_PROVIDER_SITE_OTHER): Payer: Self-pay | Admitting: Licensed Clinical Social Worker

## 2016-10-24 DIAGNOSIS — F3289 Other specified depressive episodes: Secondary | ICD-10-CM

## 2016-10-25 NOTE — Progress Notes (Signed)
   THERAPY PROGRESS NOTE  Session Time: 37min  Participation Level: Active  Behavioral Response: Casual and Well GroomedAlertEuthymic  Type of Therapy: Individual Therapy  Treatment Goals addressed: Coping  Interventions: Motivational Interviewing and Supportive  Summary: Nicole Holloway is a 46 y.o. female who presents with a euthymic mood and appropriate affect. She reported that she was doing well personally but has continued to be very frustrated with the behavior of her family members. She shared that her son has missed some appointments at [facility] and she is very concerned about him. [name] shared that she is pushing her husband to attend counseling sessions as well because of his negative attitude towards their son. She shared about the ways that she has tried to talk to her husband about his negative behaviors. She expressed hope that he will listen to someone besides her and begin to change his attitude towards their son. [name] shared about the positive characteristics of her husband, especially the ways that he supports the family. [name] reviewed her goal for treatment and shared that she is feeling much more confident and secure. She shared that when she started counseling she was "way too sensitive" and cried about her cancer diagnosis/treatment frequently. She shared that now, she feels that she has been able to process many of those feelings.   Suicidal/Homicidal: Nowithout intent/plan  Therapist Response: LCSW utilized supportive counseling techniques throughout the session in order to validate emotions and encourage open expression of emotion.LCSW and [name] discussed goals for counseling sessions and identified progress of treatment so far. LCSW provided affirmations to [facility] for her hard work in processing emotions and being vulnerable.  Plan: Return again in 2 weeks.    [name], LCSW 10/25/2016

## 2016-11-07 ENCOUNTER — Other Ambulatory Visit: Payer: Self-pay | Admitting: Licensed Clinical Social Worker

## 2016-11-21 ENCOUNTER — Ambulatory Visit: Payer: Self-pay | Admitting: Licensed Clinical Social Worker

## 2016-11-21 DIAGNOSIS — F3289 Other specified depressive episodes: Secondary | ICD-10-CM

## 2016-11-22 NOTE — Progress Notes (Signed)
   THERAPY PROGRESS NOTE  Session Time: 71min  Participation Level: Active  Behavioral Response: Casual and Well GroomedAlertEuthymic  Type of Therapy: Individual Therapy  Treatment Goals addressed: Coping  Interventions: Motivational Interviewing and Supportive  Summary: Nicole Holloway is a 46 y.o. female who presents with a euthymic mood and appropriate affect. She reported that she was feeling pretty good overall. Nicole Holloway shared frustrations that her husband continues to refuse to attend counseling and that he does not seem to understand the impact of his behavior on the family. She shared that he still insults and belittles their son, which is damaging their relationship. She expressed frustration about having to mediate their conflict constantly. Nicole Holloway shared fears about her son's behaviors as well, as he is becoming more independent and may move out of the house. Nicole Holloway shared that a good friend of the family had died unexpectedly two weeks ago and that the whole family was grieving. She shared favorite memories of her friend. She expressed the sadness and pain that she has been feeling.   Suicidal/Homicidal: Nowithout intent/plan  Therapist Response: LCSW utilized supportive counseling techniques throughout the session in order to validate emotions and encourage open expression of emotion. LCSW and Nicole Holloway processed about her current stressors. LCSW and Nicole Holloway processed about her grief.  Plan: Return again in 2 weeks.    [name], LCSW 11/22/2016

## 2016-12-01 ENCOUNTER — Encounter: Payer: Self-pay | Admitting: Internal Medicine

## 2016-12-01 ENCOUNTER — Ambulatory Visit: Payer: Self-pay | Admitting: Internal Medicine

## 2016-12-01 VITALS — BP 112/70 | HR 82 | Resp 12 | Ht 61.0 in | Wt 164.0 lb

## 2016-12-01 DIAGNOSIS — E669 Obesity, unspecified: Secondary | ICD-10-CM

## 2016-12-01 DIAGNOSIS — Z6830 Body mass index (BMI) 30.0-30.9, adult: Secondary | ICD-10-CM

## 2016-12-01 DIAGNOSIS — M545 Low back pain, unspecified: Secondary | ICD-10-CM

## 2016-12-01 DIAGNOSIS — Z23 Encounter for immunization: Secondary | ICD-10-CM

## 2016-12-01 HISTORY — DX: Obesity, unspecified: E66.9

## 2016-12-01 MED ORDER — CYCLOBENZAPRINE HCL 10 MG PO TABS: ORAL_TABLET | ORAL | 0 refills | Status: DC

## 2016-12-01 MED ORDER — IBUPROFEN 200 MG PO TABS: ORAL_TABLET | ORAL | 0 refills | Status: DC

## 2016-12-01 NOTE — Progress Notes (Signed)
   Subjective:    Patient ID: Nicole Holloway, female    DOB: [birth year], 46 y.o.   MRN: [id number]  HPI   Low back pain for 3-4 days.  Has never had before.  Only on right.  Radiates down her lateral right thigh.   Noted the pain when awakened 3-4 mornings ago and got out of bed.  Now a constant pain, no matter what her position.   No numbness or tingling of right leg.  No definite weakness.  She did slip, but did not fall a month before.  But did not have any symptoms soon after that incident. Does not recall any injury or overuse activity before her symptoms came on.   States her pain is worse with squatting down.   Has not taken anything for the discomfort. No urinary or stool incontinence.    Discussed weight today as well.  Current Meds  Medication Sig  . tamoxifen (NOLVADEX) 20 MG tablet TAKE 1 TABLET BY MOUTH ONCE DAILY    No Known Allergies    Review of Systems     Objective:   Physical Exam   NAD Lungs:  CTA CV:  RRR without murmur or rub, radial pulses normal and equal MS/back:  NT over area of right lumbosacral.paraspinous musculature and spinous processes, L/S spine. Negative straight leg raise. Neuro, LE:  Motor 5/5, DTRs 2+/4, gait normal without foot drop.  Sensory to light touch normal      Assessment & Plan:  Low back pain with right radiculopathy: Cyclobenzaprine 5-10 mg particularly at bedtime.  Ibuprofen 400-800 mg with food twice daily as needed.   To call if does not gradually improve.  2.  HM:  Influenza vaccine.  3.  Obesity:  Discussed life style changes for weight loss.

## 2016-12-01 NOTE — Patient Instructions (Signed)

## 2016-12-05 ENCOUNTER — Ambulatory Visit: Payer: Self-pay | Admitting: Licensed Clinical Social Worker

## 2016-12-05 DIAGNOSIS — F3289 Other specified depressive episodes: Secondary | ICD-10-CM

## 2016-12-06 NOTE — Progress Notes (Signed)
   THERAPY PROGRESS NOTE  Session Time: 48min  Participation Level: Active  Behavioral Response: Neat and Well GroomedAlertEuthymic  Type of Therapy: Individual Therapy  Treatment Goals addressed: Coping  Interventions: Strength-based and Supportive  Summary: Nicole Holloway is a 46 y.o. female who presents with a positive mood and appropriate affect. She reported that she has been doing well over the past few weeks. She expressed hopefulness that her husband and son seem to be communicating better, after a lot of her having to be an intermediary. She shared that her husband continues to struggle being kind to their son, due to his own upbringing with a harsh father and no mother. She shared that her main concern is that her daughter's grades were not great in her last report card. She shared her disappointment that her son does not want to go to college or pursue a Museum/gallery conservator.  [name] reported that she is walking frequently to cope with stress.  Suicidal/Homicidal: Nowithout intent/plan  Therapist Response: LCSW utilized supportive counseling techniques throughout the session in order to validate emotions and encourage open expression of emotion. LCSW and Keasha processed about her current stressors and coping skills.  Plan: Return again in 2 weeks.   [name], LCSW 12/06/2016

## 2016-12-19 ENCOUNTER — Other Ambulatory Visit: Payer: Self-pay | Admitting: Licensed Clinical Social Worker

## 2016-12-23 MED FILL — TAMOXIFEN 20 MG TABLET: 20 | 90 days supply | Qty: 90 | Fill #1

## 2017-02-02 ENCOUNTER — Ambulatory Visit: Payer: Self-pay | Admitting: Licensed Clinical Social Worker

## 2017-02-02 DIAGNOSIS — F3289 Other specified depressive episodes: Secondary | ICD-10-CM

## 2017-02-08 NOTE — Progress Notes (Signed)
   THERAPY PROGRESS NOTE  Session Time: 70min  Participation Level: Active  Behavioral Response: Neat and Well GroomedAlertDepressed  Type of Therapy: Individual Therapy  Treatment Goals addressed: Coping  Interventions: Strength-based and Supportive  Summary: Taziyah Iannuzzi is a 47 y.o. female who presents with a depressed mood and appropriate affect. She reported that she has been feeling down and overly sensitive. She shared that her son had recently had another psychotic episode and was hospitalized for several days. She stated that she is understanding now that his disorder is not related to drug use; instead, that he has a long-term mental health disorder. She shared that she is stressed from having to worry about him and now know what will happen in his future. She requested counseling for him at [facility]. Adiba became tearful as she shared her frustrations that her husband and her son continue to have a lot of conflict and it seems to be up to her to resolve it. She appeared receptive to LCSW feedback about the importance of setting strong boundaries and letting her husband and son resolve their own conflict.  Suicidal/Homicidal: Nowithout intent/plan  Therapist Response: LCSW utilized supportive counseling techniques throughout the session in order to validate emotions and encourage open expression of emotion. LCSW provided affirmations to [facility] for trying to reduce family conflict but also encouraged her to allow her family members to [place] their own issues.  Plan: Return again in 2 weeks.   [name], LCSW 02/08/2017

## 2017-02-15 NOTE — Assessment & Plan Note (Signed)
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 Adjuvant radiation therapy started 08/03/2015 followed by 2. Adjuvant antiestrogen therapy with tamoxifen 20 mg daily started 09/28/2015  Tamoxifen Toxicities: Denies any hot flashes or myalgias.  Surveillance of breast cancer: Mammograms have been scheduled for 05/19/2016.   Return to clinic in 1 year for follow-up

## 2017-02-16 ENCOUNTER — Inpatient Hospital Stay: Payer: Self-pay | Attending: Hematology and Oncology | Admitting: Hematology and Oncology

## 2017-02-16 ENCOUNTER — Other Ambulatory Visit: Payer: Self-pay | Admitting: Licensed Clinical Social Worker

## 2017-02-16 DIAGNOSIS — Z79810 Long term (current) use of selective estrogen receptor modulators (SERMs): Secondary | ICD-10-CM | POA: Insufficient documentation

## 2017-02-16 DIAGNOSIS — N644 Mastodynia: Secondary | ICD-10-CM | POA: Insufficient documentation

## 2017-02-16 DIAGNOSIS — Z170 Estrogen receptor positive status [ER+]: Secondary | ICD-10-CM | POA: Insufficient documentation

## 2017-02-16 DIAGNOSIS — C50411 Malignant neoplasm of upper-outer quadrant of right female breast: Secondary | ICD-10-CM | POA: Insufficient documentation

## 2017-02-16 MED ORDER — TAMOXIFEN CITRATE 20 MG PO TABS: 20.0000 mg | ORAL_TABLET | Freq: Every day | ORAL | 3 refills | Status: DC

## 2017-02-16 NOTE — Progress Notes (Signed)
Patient Care Team: Mack Hook, MD as PCP - General (Internal Medicine)  DIAGNOSIS:  Encounter Diagnosis  Name Primary?  . Malignant neoplasm of upper-outer quadrant of right breast in female, estrogen receptor positive ([place])     SUMMARY OF ONCOLOGIC HISTORY:   Breast cancer of upper-outer quadrant of right female breast ([place])   05/14/2015 Initial Diagnosis    Right breast biopsy 10:00: IDC with DCIS, grade 1, ER 90%, PR 80%, Ki-67 3%, HER-2 negative ratio 0.93; right breast distortion on mammogram at 10:00 6 x 4 x 4 mm ultrasound axilla negative, T1b N0 stage IA clinical stage      05/28/2015 Genetic Testing    Genetic testing did not identify a deleterious mutation.  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 Genes tested include: APC, ATM, BARD1, BMPR1A, BRCA1, BRCA2, BRIP1, CHD1, CDK4, CDKN2A, CHEK2, EPCAM (large rearrangement only), GREM1, MLH1, MSH2, MSH6, MUTYH, NBN, PALB2, PMS2, POLD1, POLE, PTEN, RAD51C, RAD51D, SMAD4, STK11, and TP53      06/08/2015 Procedure    Genetic testing: VUS on STK11 gene (considered negative). Genes analyzed:  APC, ATM, BARD1, BMPR1A, BRCA1, BRCA2, BRIP1, CHD1, CDK4, CDKN2A, CHEK2, EPCAM (large rearrangement only), GREM1, MLH1, MSH2, MSH6, MUTYH, NBN, PALB2, PMS2, POLD1, POLE, PTEN, RAD51C, RAD51D, SMAD4, STK11, and TP53      06/16/2015 Surgery    Right lumpectomy ([place]): IDC with DCIS, 2.2 cm, 0/3 sentinel nodes negative, T2 N0 stage II a, ER 90%, PR 80%, Ki-67 3%, HER-2 negative ratio 0.93 , Oncotype DX score 10, 7% ROR      06/16/2015 Oncotype testing    Recurrence score: 10; ROR 7% (low-risk)       08/03/2015 - 08/28/2015 Radiation Therapy    Adjuvant radiation therapy Isidore Moos). The Right breast was treated to 40.05 Gy in 15 fractions at 2.67 Gy per fraction. Right breast was boosted to 10 Gy in 5 fractions at 2 Gy per fraction.      09/28/2015  -  Anti-estrogen oral therapy    Tamoxifen 20 mg daily       CHIEF COMPLIANT: Follow-up on tamoxifen  INTERVAL HISTORY: Nicole Holloway is a 47 year old with above-mentioned history of right breast cancer treated with lumpectomy and is currently on oral antiestrogen therapy with tamoxifen.  Appears to be tolerating tamoxifen quite well.  She denies any lumps or nodules in the breast.  She however complains of severe sharp right breast pains that are intermittent and have been causing her discomfort.  This is especially worse when she lifts heavy objects.  She denies any hot flashes or arthralgias or myalgias.  REVIEW OF SYSTEMS:   Constitutional: Denies fevers, chills or abnormal weight loss Eyes: Denies blurriness of vision Ears, nose, mouth, throat, and face: Denies mucositis or sore throat Respiratory: Denies cough, dyspnea or wheezes Cardiovascular: Denies palpitation, chest discomfort Gastrointestinal:  Denies nausea, heartburn or change in bowel habits Skin: Denies abnormal skin rashes Lymphatics: Denies new lymphadenopathy or easy bruising Neurological:Denies numbness, tingling or new weaknesses Behavioral/Psych: Mood is stable, no new changes  Extremities: No lower extremity edema Breast:  denies any pain or lumps or nodules in either breasts All other systems were reviewed with the patient and are negative.  I have reviewed the past medical history, past surgical history, social history and family history with the patient and they are unchanged from previous note.  ALLERGIES:  has No Known Allergies.  MEDICATIONS:  Current  Outpatient Medications  Medication Sig Dispense Refill  . tamoxifen (NOLVADEX) 20 MG tablet Take 1 tablet (20 mg total) by mouth daily. 90 tablet 3   No current facility-administered medications for this visit.     PHYSICAL EXAMINATION: ECOG PERFORMANCE STATUS: 1 - Symptomatic but completely ambulatory  Vitals:   02/16/17 0847  BP: (!) 111/46    Pulse: 70  Resp: 20  Temp: 99 F (37.2 C)  SpO2: 100%   Filed Weights   02/16/17 0847  Weight: 165 lb 6.4 oz (75 kg)    GENERAL:alert, no distress and comfortable SKIN: skin color, texture, turgor are normal, no rashes or significant lesions EYES: normal, Conjunctiva are pink and non-injected, sclera clear OROPHARYNX:no exudate, no erythema and lips, buccal mucosa, and tongue normal  NECK: supple, thyroid normal size, non-tender, without nodularity LYMPH:  no palpable lymphadenopathy in the cervical, axillary or inguinal LUNGS: clear to auscultation and percussion with normal breathing effort HEART: regular rate & rhythm and no murmurs and no lower extremity edema ABDOMEN:abdomen soft, non-tender and normal bowel sounds MUSCULOSKELETAL:no cyanosis of digits and no clubbing  NEURO: alert & oriented x 3 with fluent speech, no focal motor/sensory deficits EXTREMITIES: No lower extremity edema BREAST: No palpable masses or nodules in either right or left breasts. No palpable axillary supraclavicular or infraclavicular adenopathy no breast tenderness or nipple discharge. (exam performed in the presence of a chaperone)  LABORATORY DATA:  I have reviewed the data as listed CMP Latest Ref Rng & Units 02/25/2016 05/28/2015 05/27/2015  Glucose 65 - 99 mg/dL 87 - 106  BUN 6 - 24 mg/dL 9 - 13.5  Creatinine 0.57 - 1.00 mg/dL 0.71 - 0.8  Sodium 134 - 144 mmol/L 144 - 140  Potassium 3.5 - 5.2 mmol/L 4.2 - 3.8  Chloride 96 - 106 mmol/L 105 - -  CO2 18 - 29 mmol/L 22 - 27  Calcium 8.7 - 10.2 mg/dL 9.2 - 9.2  Total Protein 6.0 - 8.5 g/dL 7.2 6.9 6.8  Total Bilirubin 0.0 - 1.2 mg/dL 0.4 <0.2 0.59  Alkaline Phos 39 - 117 IU/L 60 77 67  AST 0 - 40 IU/L '18 21 15  ' ALT 0 - 32 IU/L '26 29 27    ' Lab Results  Component Value Date   WBC 5.3 02/25/2016   HGB 14.0 02/25/2016   HCT 42.1 02/25/2016   MCV 92 02/25/2016   PLT 202 02/25/2016   NEUTROABS 3.1 02/25/2016    ASSESSMENT & PLAN:  Breast  cancer of upper-outer quadrant of right female breast ([employer]) Right lumpectomy 06/16/2015: IDC with DCIS, 2.2 cm, 0/3 sentinel nodes negative, T2 N0 stage II a, ER 90%, PR 80%, Ki-67 3%, HER-2 negative ratio 0.93  Oncotype DX score 10, 7% risk of recurrence, low risk  Current treatment: 1. Adjuvant radiation therapy started 08/03/2015 followed by 2. Adjuvant antiestrogen therapy with tamoxifen 20 mg daily started 09/28/2015  Tamoxifen Toxicities: Denies any hot flashes or myalgias.  Right breast pain: Discussed with her that it is related to scar tissue and there is not believe there is any need to do an immediate mammogram.  She will be scheduled for a mammogram in May 2019.  Surveillance of breast cancer: Breast exam: No palpable lumps or nodules there is tenderness along the surgical   Return to clinic in 1 year for follow-up     I spent 25 minutes talking to the patient of which more than half was spent in counseling and coordination of care.  No orders of the defined types were placed in this encounter.  The patient has a good understanding of the overall plan. she agrees with it. she will call with any problems that may develop before the next visit here.   Harriette Ohara, MD 02/16/17

## 2017-02-21 ENCOUNTER — Telehealth: Payer: Self-pay | Admitting: Hematology and Oncology

## 2017-02-21 NOTE — Telephone Encounter (Signed)
Interpreter spoke to patient regarding upcoming January 2020 appointments

## 2017-02-28 ENCOUNTER — Ambulatory Visit: Payer: Self-pay | Admitting: Licensed Clinical Social Worker

## 2017-02-28 DIAGNOSIS — F329 Major depressive disorder, single episode, unspecified: Secondary | ICD-10-CM

## 2017-02-28 DIAGNOSIS — F32A Depression, unspecified: Secondary | ICD-10-CM

## 2017-03-03 NOTE — Progress Notes (Signed)
   THERAPY PROGRESS NOTE  Session Time: 109min  Participation Level: Active  Behavioral Response: Neat and Well GroomedAlertEuthymic  Type of Therapy: Individual Therapy  Treatment Goals addressed: Coping  Interventions: Motivational Interviewing and Supportive  Summary: Johnnae Impastato is a 47 y.o. female who presents with a euthymic mood and appropriate affect. She shared worries about her son's behavior over the weekend, as he seemed irritable and verbally aggressive towards family members. She inquired about his progress in counseling but was receptive when LCSW set a boundary around confidentiality. Moksha asked questions about her son's diagnosis of Bipolar, sharing her thoughts that it can be caused by marijuana. She appeared receptive to LCSW feedback about drug use not being a cause of bipolar disorder. Hope and LCSW processed about her feelings regarding her son's diagnosis and how she has responded to his changing behaviors and emotions.   Suicidal/Homicidal: Nowithout intent/plan  Therapist Response: LCSW utilized supportive counseling techniques throughout the session in order to validate emotions and encourage open expression of emotion. LCSW provided psychoeducation to [facility] about bipolar disorder and how it may impact young adults. LCSW helped Dyamond identify ways that she can continue to support her son through a difficult time.  Plan: Return again in 2 weeks.   [name], LCSW 03/03/2017

## 2017-03-14 ENCOUNTER — Other Ambulatory Visit: Payer: Self-pay | Admitting: Licensed Clinical Social Worker

## 2017-03-28 ENCOUNTER — Ambulatory Visit: Payer: Self-pay | Admitting: Licensed Clinical Social Worker

## 2017-03-28 DIAGNOSIS — F32A Depression, unspecified: Secondary | ICD-10-CM

## 2017-03-28 DIAGNOSIS — F329 Major depressive disorder, single episode, unspecified: Secondary | ICD-10-CM

## 2017-03-28 NOTE — Progress Notes (Signed)
   THERAPY PROGRESS NOTE  Session Time: 31min  Participation Level: Active  Behavioral Response: Neat and Well GroomedAlertDepressed  Type of Therapy: Individual Therapy  Treatment Goals addressed: Coping  Interventions: Strength-based and Supportive  Summary: Nicole Holloway is a 47 y.o. female who presents with a depressed mood and appropriate affect. She reported that she has not been doing well because her son is not doing well with his mental health issues. She shared that he seems depressed, irritable, and has completely withdrawn from the family. She reported that he is no longer taking his psychotropic medications or attending counseling sessions. She shared frustrations that the more she tries to talk with him in a respectful, loving way, the more irritated he seems to get. Nicole Holloway shared about the ways that this is affecting her entire family, including her teenage daughter who has become more stressed and anxious. Nicole Holloway appeared receptive to LCSW feedback about setting firmer boundaries with her son, in that if he wants to live in the house -- he needs to be responsible for taking part in his mental health treatment.    Suicidal/Homicidal: Nowithout intent/plan  Therapist Response: LCSW utilized supportive counseling techniques throughout the session in order to validate emotions and encourage open expression of emotion. LCSW provided Nicole Holloway with a brochure about Bipolar Disorder in Spanish for her review. LCSW reflected on the difficulty that the whole family is experiencing due to her son's mental health disorder. LCSW encouraged Nicole Holloway to talk with her husband about what rules/boundaries they want to put into place with their son, as he needs encouragement to participate in his care. LCSW and Nicole Holloway discussed ways that she might try to communicate with him so that she can get through to him.  Plan: Return again in 2 weeks.   Nicole Clines, LCSW 03/28/2017

## 2017-03-29 ENCOUNTER — Ambulatory Visit: Payer: Self-pay | Admitting: Internal Medicine

## 2017-03-29 ENCOUNTER — Encounter: Payer: Self-pay | Admitting: Internal Medicine

## 2017-03-29 VITALS — BP 118/62 | HR 82 | Resp 12 | Ht 61.0 in | Wt 166.0 lb

## 2017-03-29 DIAGNOSIS — C50411 Malignant neoplasm of upper-outer quadrant of right female breast: Secondary | ICD-10-CM

## 2017-03-29 DIAGNOSIS — Z0000 Encounter for general adult medical examination without abnormal findings: Secondary | ICD-10-CM

## 2017-03-29 DIAGNOSIS — Z170 Estrogen receptor positive status [ER+]: Secondary | ICD-10-CM

## 2017-04-09 NOTE — Progress Notes (Signed)
Subjective:    Patient ID: Nicole Holloway, female    DOB: 02/16/1970, 47 y.o.   MRN: [id number]  HPI   CPE with pap  1.  Pap:  Last pap was 02/2016 and normal.  Taking Tamoxifen for breast cancer.  No change in her periods--no increased cramping or bleeding while taking Tamoxifen. No family history of cervical cancer.  2.  Mammogram:  Due for repeat mammogram in May.  History of breast cancer. Following with Dr. Lindi Adie at cancer center.  Generally, goes to Dr. Lindi Adie yearly now, but was seen in January for left breast discomfort. No family history of breast cancer.  3.  Osteoprevention:  Drinks Almond Milk.  Some weight bearing exercise  4.  Guaiac Cards:  Never.  No family history of breast cancer.  5.  Colonoscopy:  Never  6.  Immunizations: Immunization History  Administered Date(s) Administered  . Influenza Inj Mdck Quad Pf 12/01/2016  . Influenza,inj,Quad PF,6+ Mos 02/25/2015, 10/29/2015  . Influenza-Unspecified 12/09/2015  . Tdap 02/25/2016    7.  Glucose/Cholesterol:  Blood glucose mildly high in past.  Triglycerides also a bit high in the past.   Current Meds  Medication Sig  . tamoxifen (NOLVADEX) 20 MG tablet Take 1 tablet (20 mg total) by mouth daily.    No Known Allergies   Past Medical History:  Diagnosis Date  . Abnormal mammogram with microcalcification 07/03/2014   Left Breast calcifications  . Breast cancer ([place])   . Breast cancer ([place]) 04/2015   surgery, radiation, Tamoxifen  . Constipation   . Elevated liver enzymes 06/09/2014   AST resolved, ALT almost normal on recheck 07/2014, Abdominal Ultrasound 09/05/2014 with [facility] showed Hepatic steatosis  . External hemorrhoids   . Hepatic steatosis 09/05/2014   With elevated liver enzymes  . History of radiation therapy 08/03/15- 08/28/15   Right Breast  . Hyperglycemia 06/09/2014   101 fasting;  repeat 95  . Hyperlipidemia 06/04/2014  . Obesity 12/01/2016  . Seasonal allergies   . Varicose veins  of both lower extremities     Past Surgical History:  Procedure Laterality Date  . BREAST BIOPSY Right 05/14/2015   U/S Core- Malignant  . BREAST BIOPSY Left 05/14/2015   Stereo- Benign  . BREAST LUMPECTOMY Right 06/16/2015  . BREAST LUMPECTOMY WITH RADIOACTIVE SEED AND SENTINEL LYMPH NODE BIOPSY Right 06/16/2015   Procedure: BREAST LUMPECTOMY WITH RADIOACTIVE SEED AND SENTINEL LYMPH NODE BIOPSY;  Surgeon: Erroll Luna, MD;  Location: [facility];  Service: General;  Laterality: Right;  . CESAREAN SECTION  1998   Family History  Problem Relation Age of Onset  . Diabetes Father   . Other Father        prostate issues  . Kidney failure Father 21       started dialysis march 2nd  . Other Mother        osteoarthritis, knee replacement  . Mental illness Son 17       Bipolar  . Other Maternal Aunt        hx benign breast lump  . Kidney failure Maternal Grandmother        d. 73s; diabetes  . Diabetes Maternal Grandmother    Social History   Socioeconomic History  . Marital status: Married    Spouse name: Beatriz Chancellor  . Number of children: 2  . Years of education: Not on file  . Highest education level: Not on file  Occupational History  . Occupation: housecleaning  Social  Needs  . Financial resource strain: Not hard at all  . Food insecurity:    Worry: Never true    Inability: Never true  . Transportation needs:    Medical: No    Non-medical: No  Tobacco Use  . Smoking status: Never Smoker  . Smokeless tobacco: Never Used  Substance and Sexual Activity  . Alcohol use: No    Frequency: Never    Comment: Rare  . Drug use: No  . Sexual activity: Yes    Birth control/protection: None  Lifestyle  . Physical activity:    Days per week: 7 days    Minutes per session: 50 min  . Stress: Not at all  Relationships  . Social connections:    Talks on phone: Twice a week    Gets together: Twice a week    Attends religious service: 1 to 4 times per year     Active member of club or organization: No    Attends meetings of clubs or organizations: Never    Relationship status: Married  . Intimate partner violence:    Fear of current or ex partner: No    Emotionally abused: No    Physically abused: No    Forced sexual activity: No  Other Topics Concern  . Not on file  Social History Narrative   Originally from [place]   Came to [place] In 2004   [place] at home with husband and two kids.      Review of Systems  Constitutional: Positive for appetite change (Not eating as she should due to problems with son.) and fatigue (Not exercising enough.  Sone with Bipolar Disorder.  She feels no way to help him.  He will not allow help/meds.  In counseling with Macie Burows, LCSW.  Son spoke with Leeann Must yesterday.).  HENT: Positive for sore throat (sometimes). Negative for ear pain and hearing loss.   Eyes: Positive for visual disturbance (Reading glasses).  Respiratory: Positive for shortness of breath (Can get some dyspneawith walking and talking at same time.  Gets tired like cannot breathe.).   Cardiovascular: Negative for chest pain, palpitations and leg swelling.       No PND or orthopnea  Gastrointestinal: Negative for abdominal pain, blood in stool, constipation and diarrhea.  Genitourinary: Negative for dysuria, frequency and vaginal discharge.  Neurological: Positive for numbness (Hands with numbness.).  Psychiatric/Behavioral: Negative for dysphoric mood.       Objective:   Physical Exam  Constitutional: She is oriented to person, place, and time. She appears well-developed and well-nourished.  HENT:  Head: Normocephalic and atraumatic.  Right Ear: Hearing, tympanic membrane, external ear and ear canal normal.  Left Ear: Hearing, tympanic membrane, external ear and ear canal normal.  Nose: Nose normal.  Mouth/Throat: Uvula is midline, oropharynx is clear and moist and mucous membranes are normal.  Eyes: Pupils are equal, round, and  reactive to light. EOM are normal.  Discs sharp bilaterally  Neck: Normal range of motion and full passive range of motion without pain. Neck supple. No thyromegaly present.  Cardiovascular: Normal rate, regular rhythm, S1 normal and S2 normal. Exam reveals no S3, no S4 and no friction rub.  No murmur heard. No carotid bruits.  Carotid, radial, femoral, DP and PT pulses normal and equal.   Pulmonary/Chest: Effort normal and breath sounds normal. Right breast exhibits skin change (surgical scarring upper outer quadrant). Right breast exhibits no inverted nipple, no mass, no nipple discharge and no tenderness. Left  breast exhibits no inverted nipple, no mass, no nipple discharge, no skin change and no tenderness.  Abdominal: Soft. Bowel sounds are normal. There is no hepatosplenomegaly. There is no tenderness. No hernia.  Genitourinary:  Genitourinary Comments: Normal external genitalia.  No uterine or adnexal mass or tenderness.  Musculoskeletal: Normal range of motion.  Lymphadenopathy:       Head (right side): No submental and no submandibular adenopathy present.       Head (left side): No submental and no submandibular adenopathy present.    She has no cervical adenopathy.    She has no axillary adenopathy.       Right: No inguinal and no supraclavicular adenopathy present.       Left: No inguinal and no supraclavicular adenopathy present.  Neurological: She is alert and oriented to person, place, and time. She has normal strength and normal reflexes. No cranial nerve deficit or sensory deficit. Coordination and gait normal.  Skin: Skin is warm and dry. No rash noted.  Psychiatric: Her behavior is normal. Judgment and thought content normal. Her mood appears anxious (appears anxious when speaking of son's health). Cognition and memory are normal.          Assessment & Plan:  1.  CPE without pap:   Return in 2 weeks for FLP/CMP and to return Guaiac Cards.  2.  Stress/anxiety regarding  son:  Continue counseling with Macie Burows, LCSW.  Will arrange for home visit to see son and see if he will be willing to restart medication.    3.  History of breast cancer 05/2015.  Mammogram end of May.

## 2017-04-11 ENCOUNTER — Other Ambulatory Visit: Payer: Self-pay | Admitting: Licensed Clinical Social Worker

## 2017-04-13 ENCOUNTER — Other Ambulatory Visit: Payer: Self-pay

## 2017-04-13 DIAGNOSIS — R195 Other fecal abnormalities: Secondary | ICD-10-CM

## 2017-04-13 DIAGNOSIS — E785 Hyperlipidemia, unspecified: Secondary | ICD-10-CM

## 2017-04-13 DIAGNOSIS — Z79899 Other long term (current) drug therapy: Secondary | ICD-10-CM

## 2017-04-13 DIAGNOSIS — Z1211 Encounter for screening for malignant neoplasm of colon: Secondary | ICD-10-CM

## 2017-04-13 LAB — POC HEMOCCULT BLD/STL (HOME/3-CARD/SCREEN)
Card #3 Fecal Occult Blood, POC: POSITIVE
FECAL OCCULT BLD: NEGATIVE
FECAL OCCULT BLD: POSITIVE — AB

## 2017-04-13 MED FILL — TAMOXIFEN 20 MG TABLET: 20 | 90 days supply | Qty: 90 | Fill #2

## 2017-04-13 NOTE — Addendum Note (Signed)
Addended by: Serafina Mitchell on: 04/13/2017 10:20 AM   Modules accepted: Orders

## 2017-04-13 NOTE — Addendum Note (Signed)
Addended by: Serafina Mitchell on: 04/13/2017 10:21 AM   Modules accepted: Orders

## 2017-04-14 LAB — CBC WITH DIFFERENTIAL/PLATELET
BASOS: 0 %
Basophils Absolute: 0 10*3/uL (ref 0.0–0.2)
EOS (ABSOLUTE): 0.1 10*3/uL (ref 0.0–0.4)
EOS: 2 %
HEMATOCRIT: 39.8 % (ref 34.0–46.6)
HEMOGLOBIN: 13.5 g/dL (ref 11.1–15.9)
Immature Grans (Abs): 0 10*3/uL (ref 0.0–0.1)
Immature Granulocytes: 0 %
LYMPHS ABS: 1.4 10*3/uL (ref 0.7–3.1)
Lymphs: 33 %
MCH: 31.1 pg (ref 26.6–33.0)
MCHC: 33.9 g/dL (ref 31.5–35.7)
MCV: 92 fL (ref 79–97)
MONOCYTES: 8 %
Monocytes Absolute: 0.4 10*3/uL (ref 0.1–0.9)
Neutrophils Absolute: 2.5 10*3/uL (ref 1.4–7.0)
Neutrophils: 57 %
Platelets: 192 10*3/uL (ref 150–379)
RBC: 4.34 x10E6/uL (ref 3.77–5.28)
RDW: 13.6 % (ref 12.3–15.4)
WBC: 4.3 10*3/uL (ref 3.4–10.8)

## 2017-04-14 LAB — COMPREHENSIVE METABOLIC PANEL
ALBUMIN: 4.2 g/dL (ref 3.5–5.5)
ALT: 52 IU/L — ABNORMAL HIGH (ref 0–32)
AST: 31 IU/L (ref 0–40)
Albumin/Globulin Ratio: 1.8 (ref 1.2–2.2)
Alkaline Phosphatase: 56 IU/L (ref 39–117)
BUN / CREAT RATIO: 12 (ref 9–23)
BUN: 9 mg/dL (ref 6–24)
Bilirubin Total: 0.4 mg/dL (ref 0.0–1.2)
CALCIUM: 8.9 mg/dL (ref 8.7–10.2)
CO2: 25 mmol/L (ref 20–29)
CREATININE: 0.75 mg/dL (ref 0.57–1.00)
Chloride: 107 mmol/L — ABNORMAL HIGH (ref 96–106)
GFR, EST AFRICAN AMERICAN: 111 mL/min/{1.73_m2} (ref >59)
GFR, EST NON AFRICAN AMERICAN: 97 mL/min/{1.73_m2} (ref >59)
GLOBULIN, TOTAL: 2.4 g/dL (ref 1.5–4.5)
GLUCOSE: 88 mg/dL (ref 65–99)
Potassium: 4.7 mmol/L (ref 3.5–5.2)
SODIUM: 145 mmol/L — AB (ref 134–144)
TOTAL PROTEIN: 6.6 g/dL (ref 6.0–8.5)

## 2017-04-14 LAB — LIPID PANEL W/O CHOL/HDL RATIO
Cholesterol, Total: 168 mg/dL (ref 100–199)
HDL: 40 mg/dL (ref >39)
LDL Calculated: 106 mg/dL — ABNORMAL HIGH (ref 0–99)
Triglycerides: 110 mg/dL (ref 0–149)
VLDL Cholesterol Cal: 22 mg/dL (ref 5–40)

## 2017-04-17 NOTE — Progress Notes (Deleted)
Subjective:    Patient ID: Nicole Holloway, female    DOB: 02-06-1970, 46 y.o.   MRN: [id number]  HPI   CPE with pap  1.  Pap:  Last pap was 02/2016 and normal.  Taking Tamoxifen for breast cancer.  No change in her periods--no increased cramping or bleeding while taking Tamoxifen. No family history of cervical cancer.  2.  Mammogram:  Due for repeat mammogram in May.  History of breast cancer. Following with Dr. Lindi Holloway at cancer center.  Generally, goes to Dr. Lindi Holloway yearly now, but was seen in January for left breast discomfort. No family history of breast cancer.  3.  Osteoprevention:  Drinks Almond Milk.  Some weight bearing exercise  4.  Guaiac Cards:  Never.  No family history of breast cancer.  5.  Colonoscopy:  Never  6.  Immunizations: Immunization History  Administered Date(s) Administered  . Influenza Inj Mdck Quad Pf 12/01/2016  . Influenza,inj,Quad PF,6+ Mos 02/25/2015, 10/29/2015  . Influenza-Unspecified 12/09/2015  . Tdap 02/25/2016    7.  Glucose/Cholesterol:  Blood glucose mildly high in past.  Triglycerides also a bit high in the past.   Current Meds  Medication Sig  . tamoxifen (NOLVADEX) 20 MG tablet Take 1 tablet (20 mg total) by mouth daily.    No Known Allergies   Past Medical History:  Diagnosis Date  . Abnormal mammogram with microcalcification 07/03/2014   Left Breast calcifications  . Breast cancer ([place])   . Breast cancer ([place]) 04/2015   surgery, radiation, Tamoxifen  . Constipation   . Elevated liver enzymes 06/09/2014   AST resolved, ALT almost normal on recheck 07/2014, Abdominal Ultrasound 09/05/2014 with [facility] showed Hepatic steatosis  . External hemorrhoids   . Hepatic steatosis 09/05/2014   With elevated liver enzymes  . History of radiation therapy 08/03/15- 08/28/15   Right Breast  . Hyperglycemia 06/09/2014   101 fasting;  repeat 95  . Hyperlipidemia 06/04/2014  . Obesity 12/01/2016  . Seasonal allergies   . Varicose veins  of both lower extremities     Past Surgical History:  Procedure Laterality Date  . BREAST BIOPSY Right 05/14/2015   U/S Core- Malignant  . BREAST BIOPSY Left 05/14/2015   Stereo- Benign  . BREAST LUMPECTOMY Right 06/16/2015  . BREAST LUMPECTOMY WITH RADIOACTIVE SEED AND SENTINEL LYMPH NODE BIOPSY Right 06/16/2015   Procedure: BREAST LUMPECTOMY WITH RADIOACTIVE SEED AND SENTINEL LYMPH NODE BIOPSY;  Surgeon: Nicole Luna, MD;  Location: [facility];  Service: General;  Laterality: Right;  . CESAREAN SECTION  1998   Family History  Problem Relation Age of Onset  . Diabetes Father   . Other Father        prostate issues  . Kidney failure Father 80       started dialysis march 2nd  . Other Mother        osteoarthritis, knee replacement  . Mental illness Son 58       Bipolar  . Other Maternal Aunt        hx benign breast lump  . Kidney failure Maternal Grandmother        d. 28s; diabetes  . Diabetes Maternal Grandmother    Social History   Socioeconomic History  . Marital status: Married    Spouse name: Nicole Holloway  . Number of children: 2  . Years of education: Not on file  . Highest education level: Not on file  Occupational History  . Occupation: housecleaning  Social  Needs  . Financial resource strain: Not hard at all  . Food insecurity:    Worry: Never true    Inability: Never true  . Transportation needs:    Medical: No    Non-medical: No  Tobacco Use  . Smoking status: Never Smoker  . Smokeless tobacco: Never Used  Substance and Sexual Activity  . Alcohol use: No    Frequency: Never    Comment: Rare  . Drug use: No  . Sexual activity: Yes    Birth control/protection: None  Lifestyle  . Physical activity:    Days per week: 7 days    Minutes per session: 50 min  . Stress: Not at all  Relationships  . Social connections:    Talks on phone: Twice a week    Gets together: Twice a week    Attends religious service: 1 to 4 times per year     Active member of club or organization: No    Attends meetings of clubs or organizations: Never    Relationship status: Married  . Intimate partner violence:    Fear of current or ex partner: No    Emotionally abused: No    Physically abused: No    Forced sexual activity: No  Other Topics Concern  . Not on file  Social History Narrative   Originally from [place]   Came to [place] In 2004   LIves at home with husband and two kids.      Review of Systems  Constitutional: Positive for appetite change (Not eating as she should due to problems with son.) and fatigue (Not exercising enough.  Sone with Bipolar Disorder.  She feels no way to help him.  He will not allow help/meds.  In counseling with Nicole Burows, LCSW.  Son spoke with Nicole Holloway yesterday.).  HENT: Positive for sore throat (sometimes). Negative for ear pain and hearing loss.   Eyes: Positive for visual disturbance (Reading glasses).  Respiratory: Positive for shortness of breath (Can get some dyspneawith walking and talking at same time.  Gets tired like cannot breathe.).   Cardiovascular: Negative for chest pain, palpitations and leg swelling.       No PND or orthopnea  Gastrointestinal: Negative for abdominal pain, blood in stool, constipation and diarrhea.  Genitourinary: Negative for dysuria, frequency and vaginal discharge.  Neurological: Positive for numbness (Hands with numbness.).  Psychiatric/Behavioral: Negative for dysphoric mood.       Objective:   Physical Exam  Constitutional: She is oriented to person, place, and time. She appears well-developed and well-nourished.  HENT:  Head: Normocephalic and atraumatic.  Right Ear: Hearing, tympanic membrane, external ear and ear canal normal.  Left Ear: Hearing, tympanic membrane, external ear and ear canal normal.  Nose: Nose normal.  Mouth/Throat: Uvula is midline, oropharynx is clear and moist and mucous membranes are normal.  Eyes: Pupils are equal, round, and  reactive to light. EOM are normal.  Discs sharp bilaterally  Neck: Normal range of motion and full passive range of motion without pain. Neck supple. No thyromegaly present.  Cardiovascular: Normal rate, regular rhythm, S1 normal and S2 normal. Exam reveals no S3, no S4 and no friction rub.  No murmur heard. No carotid bruits.  Carotid, radial, femoral, DP and PT pulses normal and equal.   Pulmonary/Chest: Effort normal and breath sounds normal. Right breast exhibits skin change (surgical scarring upper outer quadrant). Right breast exhibits no inverted nipple, no mass, no nipple discharge and no tenderness. Left  breast exhibits no inverted nipple, no mass, no nipple discharge, no skin change and no tenderness.  Abdominal: Soft. Bowel sounds are normal. There is no hepatosplenomegaly. There is no tenderness. No hernia.  Genitourinary:  Genitourinary Comments: Normal external genitalia.  No uterine or adnexal mass or tenderness.  Musculoskeletal: Normal range of motion.  Lymphadenopathy:       Head (right side): No submental and no submandibular adenopathy present.       Head (left side): No submental and no submandibular adenopathy present.    She has no cervical adenopathy.    She has no axillary adenopathy.       Right: No inguinal and no supraclavicular adenopathy present.       Left: No inguinal and no supraclavicular adenopathy present.  Neurological: She is alert and oriented to person, place, and time. She has normal strength and normal reflexes. No cranial nerve deficit or sensory deficit. Coordination and gait normal.  Skin: Skin is warm and dry. No rash noted.  Psychiatric: Her behavior is normal. Judgment and thought content normal. Her mood appears anxious (appears anxious when speaking of son's health). Cognition and memory are normal.          Assessment & Plan:  1.  CPE without pap:   Return in 2 weeks for FLP/CMP and to return Guaiac Cards.  2.  Stress/anxiety regarding  son:  Continue counseling with Nicole Burows, LCSW.  Will arrange for home visit to see son and see if he will be willing to restart medication.    3.  History of breast cancer 05/2015.  Mammogram end of May.

## 2017-05-01 ENCOUNTER — Ambulatory Visit: Payer: Self-pay | Admitting: Hematology and Oncology

## 2017-05-02 NOTE — Progress Notes (Signed)
Guaiac positive 2 out of 3 cards. Schedule diagnostic colonoscopy.

## 2017-05-02 NOTE — Addendum Note (Signed)
Addended by: Marcelino Duster on: 05/02/2017 08:32 AM   Modules accepted: Orders

## 2017-05-23 ENCOUNTER — Ambulatory Visit: Payer: Self-pay | Admitting: Licensed Clinical Social Worker

## 2017-05-23 DIAGNOSIS — F32A Depression, unspecified: Secondary | ICD-10-CM

## 2017-05-23 DIAGNOSIS — F329 Major depressive disorder, single episode, unspecified: Secondary | ICD-10-CM

## 2017-05-24 ENCOUNTER — Telehealth: Payer: Self-pay | Admitting: Internal Medicine

## 2017-05-24 NOTE — Telephone Encounter (Signed)
Discussed looking for ACT Team for son. Waiting to hear back from [facility]. Elita Quick reportedly asked a man who knows the family for a gun.  The gentleman called the family to ask why Elita Quick might need a gun. Chrissi asked Elita Quick later why he wanted a gun, but was not really able to get an answer.  Elita Quick also picked up a utility knife from the kitchen table.  [facility], family friend, saw this as she came in home and asked her to hand it over, which he did without question per mom.  Aundria gave her permission for me to speak with Juan's therapist about looking for a gun and having the utility knife, the latter history possibly not concerning.
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
## 2017-06-06 ENCOUNTER — Ambulatory Visit: Payer: Self-pay | Admitting: Licensed Clinical Social Worker

## 2017-06-06 DIAGNOSIS — F329 Major depressive disorder, single episode, unspecified: Secondary | ICD-10-CM

## 2017-06-06 DIAGNOSIS — F32A Depression, unspecified: Secondary | ICD-10-CM

## 2017-06-08 ENCOUNTER — Encounter: Payer: Self-pay | Admitting: Gastroenterology

## 2017-06-14 NOTE — Progress Notes (Signed)
   THERAPY PROGRESS NOTE  Session Time: 70min  Participation Level: Active  Behavioral Response: Neat and Well GroomedAlertEuthymic  Type of Therapy: Individual Therapy  Treatment Goals addressed: Coping  Interventions: Strength-based and Supportive  Summary: Nicole Holloway is a 47 y.o. female who presents with a euthymic mood and appropriate affect. She reported that things have been more calm at home, though she continues to feel worried and stressed about her son. She shared that he is completely isolated at this point and will barely interact with the family. She shared that she accompanied him to his psychiatrist appointment at [facility] and that he has been referred to another doctor for a different type of medication. [name] shared her ongoing frustrations that because he is an adult, she is not told what is happening with his care or what the options are. She shared that her husband has been yelling at their son and getting very frustrated with him. She stated that she always has to be an intermediary between them and that it is tiring.    Suicidal/Homicidal: Nowithout intent/plan  Therapist Response: LCSW utilized supportive counseling techniques throughout the session in order to validate emotions and encourage open expression of emotion. LCSW shared about communication with Congregational Nurses in regards to resources for son; LCSW shared that the nurses did not have ideas for additional resources. LCSW encouraged [name] to continue good self-care during this difficult time.  Plan: Return again in 2 weeks.   [name], LCSW 06/14/2017

## 2017-06-15 ENCOUNTER — Encounter: Payer: Self-pay | Admitting: Gastroenterology

## 2017-06-15 ENCOUNTER — Ambulatory Visit (INDEPENDENT_AMBULATORY_CARE_PROVIDER_SITE_OTHER): Payer: Self-pay | Admitting: Gastroenterology

## 2017-06-15 VITALS — BP 100/60 | HR 74 | Ht 63.0 in | Wt 166.4 lb

## 2017-06-15 DIAGNOSIS — K625 Hemorrhage of anus and rectum: Secondary | ICD-10-CM

## 2017-06-15 MED ORDER — NA SULFATE-K SULFATE-MG SULF 17.5-3.13-1.6 GM/177ML PO SOLN: 1.0000 | Freq: Once | ORAL | 0 refills | Status: AC

## 2017-06-15 NOTE — Progress Notes (Signed)
Thank you for sending this case to me. I have reviewed the entire note, and the outlined plan seems appropriate.  Colonoscopy appropriate to rule out proctitis or neoplasia.  May be hemorrhoidal with symptoms as described.  Wilfrid Lund, MD

## 2017-06-15 NOTE — Patient Instructions (Signed)

## 2017-06-15 NOTE — Progress Notes (Signed)
06/15/2017 Nicole Holloway [id number] [birth year]   HISTORY OF PRESENT ILLNESS: This is a pleasant 47 year old female who is new to our office.  She presents to our office today at the request of her PCP, Dr. Amil Amen, for evaluation regarding rectal bleeding.  The patient tells me that she has been seeing bright red blood with bowel movements for the past several months (since January).  She says that she sees it with each bowel movement.  Usually has bowel movements about twice daily.  She says she believes it started when she had a constipated or hard stool, but now occurs on a daily basis.  It only occurs with bowel movements.  She also describes some discomfort or burning in her rectum as well.  Says that she sometimes feels this burning and discomfort even when she has not had a bowel movement.  She denies any abdominal pain.  Never had rectal bleeding prior to these recent issues.  Hgb was normal two months ago.   Past Medical History:  Diagnosis Date  . Abnormal mammogram with microcalcification 07/03/2014   Left Breast calcifications  . Breast cancer ([place])   . Breast cancer ([place]) 04/2015   surgery, radiation, Tamoxifen  . Constipation   . Elevated liver enzymes 06/09/2014   AST resolved, ALT almost normal on recheck 07/2014, Abdominal Ultrasound 09/05/2014 with [facility] showed Hepatic steatosis  . External hemorrhoids   . Hepatic steatosis 09/05/2014   With elevated liver enzymes  . History of radiation therapy 08/03/15- 08/28/15   Right Breast  . Hyperglycemia 06/09/2014   101 fasting;  repeat 95  . Hyperlipidemia 06/04/2014  . Obesity 12/01/2016  . Seasonal allergies   . Varicose veins of both lower extremities    Past Surgical History:  Procedure Laterality Date  . BREAST BIOPSY Right 05/14/2015   U/S Core- Malignant  . BREAST BIOPSY Left 05/14/2015   Stereo- Benign  . BREAST LUMPECTOMY Right 06/16/2015  . BREAST LUMPECTOMY WITH RADIOACTIVE SEED AND SENTINEL LYMPH NODE  BIOPSY Right 06/16/2015   Procedure: BREAST LUMPECTOMY WITH RADIOACTIVE SEED AND SENTINEL LYMPH NODE BIOPSY;  Surgeon: Erroll Luna, MD;  Location: [facility];  Service: General;  Laterality: Right;  . [place]    reports that she has never smoked. She has never used smokeless tobacco. She reports that she does not drink alcohol or use drugs. family history includes Diabetes in her father and maternal grandmother; Kidney failure in her maternal grandmother; Kidney failure (age of onset: 16) in her father; Mental illness (age of onset: 30) in her son; Other in her father, maternal aunt, and mother. No Known Allergies    Outpatient Encounter Medications as of 06/15/2017  Medication Sig  . fexofenadine (ALLEGRA) 60 MG tablet Take 60 mg by mouth 2 (two) times daily.  . tamoxifen (NOLVADEX) 20 MG tablet Take 1 tablet (20 mg total) by mouth daily.   No facility-administered encounter medications on file as of 06/15/2017.      REVIEW OF SYSTEMS  : All other systems reviewed and negative except where noted in the History of Present Illness.   PHYSICAL EXAM: BP 100/60   Pulse 74   Ht 5\' 3"  (1.6 m)   Wt 166 lb 6.4 oz (75.5 kg)   BMI 29.48 kg/m  General: Well developed Hispanic female in no acute distress Head: Normocephalic and atraumatic Eyes:  Sclerae anicteric, conjunctiva pink. Ears: Normal auditory acuity Lungs: Clear throughout to auscultation; no increased WOB. Heart:  Regular rate and rhythm; no M/R/G. Abdomen: Soft, non-distended.  BS present.  Non-tender. Rectal:  No external abnormalities noted.  DRE did not reveal any abnormalities.  Anoscopy performed and she reported some tenderness and discomfort but no definite fissure or other source of bleeding seen. Musculoskeletal: Symmetrical with no gross deformities  Skin: No lesions on visible extremities Extremities: No edema  Neurological: Alert oriented x 4, grossly non-focal Psychological:  Alert  and cooperative. Normal mood and affect  ASSESSMENT AND PLAN: *Rectal bleeding and rectal pain:  Initially I thought maybe she had a fissure.  Anoscopy performed today and no definite source or bleeding or pain seen.  Will schedule for colonoscopy with Dr. Loletha Carrow.    **The risks, benefits, and alternatives to colonoscopy were discussed with the patient and she consents to proceed. **Entire visit was performed with interpreter, initially video interpreter and then office staff.     CC:  Mack Hook, MD

## 2017-06-20 ENCOUNTER — Other Ambulatory Visit: Payer: Self-pay | Admitting: Licensed Clinical Social Worker

## 2017-06-20 ENCOUNTER — Other Ambulatory Visit (HOSPITAL_COMMUNITY): Payer: Self-pay | Admitting: *Deleted

## 2017-06-20 DIAGNOSIS — Z853 Personal history of malignant neoplasm of breast: Secondary | ICD-10-CM

## 2017-06-21 ENCOUNTER — Ambulatory Visit: Payer: Self-pay | Admitting: Internal Medicine

## 2017-06-21 ENCOUNTER — Encounter: Payer: Self-pay | Admitting: Internal Medicine

## 2017-06-21 VITALS — BP 110/70 | HR 78 | Resp 12 | Ht 61.0 in | Wt 165.0 lb

## 2017-06-21 DIAGNOSIS — K047 Periapical abscess without sinus: Secondary | ICD-10-CM

## 2017-06-21 DIAGNOSIS — B3731 Acute candidiasis of vulva and vagina: Secondary | ICD-10-CM

## 2017-06-21 DIAGNOSIS — B373 Candidiasis of vulva and vagina: Secondary | ICD-10-CM

## 2017-06-21 MED ORDER — PENICILLIN V POTASSIUM 250 MG PO TABS: ORAL_TABLET | ORAL | 0 refills | Status: DC

## 2017-06-21 MED ORDER — MICONAZOLE NITRATE 2 % VA CREA: 1.0000 | TOPICAL_CREAM | Freq: Every day | VAGINAL | 0 refills | Status: DC

## 2017-06-21 NOTE — Progress Notes (Signed)
   Subjective:    Patient ID: Nicole Holloway, female    DOB: [birth year], 47 y.o.   MRN: [id number]  HPI   Pain in left lower jaw around one of her teeth.  Has had pain for 1 day.  Took Advil 800 mg and allowed her to sleep a bit last night.   No fever.  Has not had problems with this tooth previously.   She does have a lower partial for more than 1 year, but she feels it is making the anchoring tooth that now hurts loose.   She wears the partial daily, but not at night with sleep.  Current Meds  Medication Sig  . fexofenadine (ALLEGRA) 60 MG tablet Take 60 mg by mouth 2 (two) times daily.  Marland Kitchen ibuprofen (ADVIL,MOTRIN) 200 MG tablet Take 200 mg by mouth every 6 (six) hours as needed.  . tamoxifen (NOLVADEX) 20 MG tablet Take 1 tablet (20 mg total) by mouth daily.    No Known Allergies     Review of Systems     Objective:   Physical Exam Mild discomfort HEENT:  PERRL, EOMI, TMs pearly gray, throat without injection, Mild gingival swelling about posterior mandibular molar.  Pain with tapping the tooth as well as palpation of gingiva surrounding the tooth. Neck:  Supple, No adenopathy, but mild tenderness of anterior left cervical chain. Chest:  CTA CV:  RRR without murmur or rub.  Patient gives info she is having itching in inguinal and vulvar area after exam.  No vaginal discharge.  I took a look and did not see any rash or erythema in area of concern.       Assessment & Plan:  1.  Abscessed left mandibular molar, also anchor for partial.  Referral to dental clinic, which we still have not heard is reopened.   Penicillin 250 mg 4 times daily for 7 days. Ibuprofen 2-4 tabs (200 mg tabs) every 6 hours as needed with food.  2.  Itching in groin area:  No findings, but suggested considering Miconazole 7 day vaginal cream if worsens with use of penicillin.

## 2017-06-22 MED FILL — SUPREP BOWEL PREP KIT: 17.5-3.13-1 | 2 days supply | Qty: 354 | Fill #0

## 2017-07-04 ENCOUNTER — Other Ambulatory Visit: Payer: Self-pay

## 2017-07-04 ENCOUNTER — Ambulatory Visit (AMBULATORY_SURGERY_CENTER): Payer: Self-pay | Admitting: Gastroenterology

## 2017-07-04 ENCOUNTER — Encounter: Payer: Self-pay | Admitting: Gastroenterology

## 2017-07-04 VITALS — BP 115/59 | HR 63 | Temp 98.9°F | Resp 14 | Ht 63.0 in | Wt 166.0 lb

## 2017-07-04 DIAGNOSIS — K625 Hemorrhage of anus and rectum: Secondary | ICD-10-CM

## 2017-07-04 MED ORDER — SODIUM CHLORIDE 0.9 % IV SOLN: 500.0000 mL | Freq: Once | INTRAVENOUS | Status: DC

## 2017-07-04 NOTE — Progress Notes (Signed)
Spontaneous respirations throughout. VSS. Resting comfortably. To PACU on room air. Report to  RN. 

## 2017-07-04 NOTE — Op Note (Signed)
[facility] Patient Name: Nicole Holloway Procedure Date: 07/04/2017 8:52 AM MRN: [id number] Endoscopist: Mallie Mussel L. Loletha Carrow , MD Age: 47 Referring MD:  Date of Birth: 04-19-1970 Gender: Female Account #: [id number] Procedure:                Colonoscopy Indications:              Rectal bleeding Medicines:                Monitored Anesthesia Care Procedure:                Pre-Anesthesia Assessment:                           - Prior to the procedure, a History and Physical                            was performed, and patient medications and                            allergies were reviewed. The patient's tolerance of                            previous anesthesia was also reviewed. The risks                            and benefits of the procedure and the sedation                            options and risks were discussed with the patient.                            All questions were answered, and informed consent                            was obtained. Prior Anticoagulants: The patient has                            taken no previous anticoagulant or antiplatelet                            agents. ASA Grade Assessment: II - A patient with                            mild systemic disease. After reviewing the risks                            and benefits, the patient was deemed in                            satisfactory condition to undergo the procedure.                           After obtaining informed consent, the colonoscope  was passed under direct vision. Throughout the                            procedure, the patient's blood pressure, pulse, and                            oxygen saturations were monitored continuously. The                            Colonoscope was introduced through the anus and                            advanced to the the terminal ileum, with                            identification of the appendiceal orifice and IC                             valve. The quality of the bowel preparation was                            excellent. The terminal ileum, ileocecal valve,                            appendiceal orifice, and rectum were photographed.                            The quality of the bowel preparation was evaluated                            using the BBPS [facility] Bowel Preparation Scale)                            with scores of: Right Colon = 3, Transverse Colon =                            3 and Left Colon = 3 (entire mucosa seen well with                            no residual staining, small fragments of stool or                            opaque liquid). The total BBPS score equals 9. Scope In: 8:59:27 AM Scope Out: 9:12:14 AM Scope Withdrawal Time: 0 hours 8 minutes 16 seconds  Total Procedure Duration: 0 hours 12 minutes 47 seconds  Findings:                 The perianal and digital rectal examinations were                            normal.                           The terminal  ileum appeared normal.                           The entire examined colon appeared normal on direct                            and retroflexion views. Complications:            No immediate complications. Estimated Blood Loss:     Estimated blood loss: none. Impression:               - The examined portion of the ileum was normal.                           - The entire examined colon is normal on direct and                            retroflexion views.                           - No specimens collected.                           Benign anal bleeding. Recommendation:           - Patient has a contact number available for                            emergencies. The signs and symptoms of potential                            delayed complications were discussed with the                            patient. Return to normal activities tomorrow.                            Written discharge instructions were provided to  the                            patient.                           - Resume previous diet.                           - Continue present medications.                           - Repeat colonoscopy in 10 years for screening                            purposes.                           - Daily fiber supplement to avoid constipation. Teal Raben L. Loletha Carrow, MD 07/04/2017 9:15:09 AM This report has been signed electronically.

## 2017-07-04 NOTE — Progress Notes (Signed)
Pt's states no medical or surgical changes since previsit or office visit. 

## 2017-07-04 NOTE — Patient Instructions (Signed)
Please read handout on high fiber diet.     YOU HAD AN ENDOSCOPIC PROCEDURE TODAY AT [facility]:   Refer to the procedure report that was given to you for any specific questions about what was found during the examination.  If the procedure report does not answer your questions, please call your gastroenterologist to clarify.  If you requested that your care partner not be given the details of your procedure findings, then the procedure report has been included in a sealed envelope for you to review at your convenience later.  YOU SHOULD EXPECT: Some feelings of bloating in the abdomen. Passage of more gas than usual.  Walking can help get rid of the air that was put into your GI tract during the procedure and reduce the bloating. If you had a lower endoscopy (such as a colonoscopy or flexible sigmoidoscopy) you may notice spotting of blood in your stool or on the toilet paper. If you underwent a bowel prep for your procedure, you may not have a normal bowel movement for a few days.  Please Note:  You might notice some irritation and congestion in your nose or some drainage.  This is from the oxygen used during your procedure.  There is no need for concern and it should clear up in a day or so.  SYMPTOMS TO REPORT IMMEDIATELY:   Following lower endoscopy (colonoscopy or flexible sigmoidoscopy):  Excessive amounts of blood in the stool  Significant tenderness or worsening of abdominal pains  Swelling of the abdomen that is new, acute  Fever of 100F or higher    For urgent or emergent issues, a gastroenterologist can be reached at any hour by calling [phone].   DIET:  We do recommend a small meal at first, but then you may proceed to your regular diet.  Drink plenty of fluids but you should avoid alcoholic beverages for 24 hours.  ACTIVITY:  You should plan to take it easy for the rest of today and you should NOT DRIVE or use heavy machinery until tomorrow (because  of the sedation medicines used during the test).    FOLLOW UP: Our staff will call the number listed on your records the next business day following your procedure to check on you and address any questions or concerns that you may have regarding the information given to you following your procedure. If we do not reach you, we will leave a message.  However, if you are feeling well and you are not experiencing any problems, there is no need to return our call.  We will assume that you have returned to your regular daily activities without incident.  If any biopsies were taken you will be contacted by phone or by letter within the next 1-3 weeks.  Please call us at [phone] if you have not heard about the biopsies in 3 weeks.    SIGNATURES/CONFIDENTIALITY: You and/or your care partner have signed paperwork which will be entered into your electronic medical record.  These signatures attest to the fact that that the information above on your After Visit Summary has been reviewed and is understood.  Full responsibility of the confidentiality of this discharge information lies with you and/or your care-partner.

## 2017-07-05 ENCOUNTER — Telehealth: Payer: Self-pay

## 2017-07-05 NOTE — Telephone Encounter (Signed)
  Follow up Call-  Call back number 07/04/2017  Permission to leave phone message Yes  Some recent data might be hidden     No id on answering machine.  No message left. Angela/call-back [facility]

## 2017-07-05 NOTE — Telephone Encounter (Signed)
  Follow up Call-  Call back number 07/04/2017  Permission to leave phone message Yes  Some recent data might be hidden    Called [phone],  No message left, B.Demetris Meinhardt RN Patient questions:

## 2017-07-07 ENCOUNTER — Ambulatory Visit (INDEPENDENT_AMBULATORY_CARE_PROVIDER_SITE_OTHER): Payer: Self-pay | Admitting: Licensed Clinical Social Worker

## 2017-07-07 DIAGNOSIS — F32A Depression, unspecified: Secondary | ICD-10-CM

## 2017-07-07 DIAGNOSIS — F329 Major depressive disorder, single episode, unspecified: Secondary | ICD-10-CM

## 2017-07-07 NOTE — Progress Notes (Signed)
   THERAPY PROGRESS NOTE  Session Time: 76min  Participation Level: Active  Behavioral Response: Casual and Well GroomedAlertDepressed  Type of Therapy: Individual Therapy  Treatment Goals addressed: Coping  Interventions: Strength-based and Supportive  Summary: Nicole Holloway is a 47 y.o. female who presents with a depressed mood and appropriate affect. She reported that her adult son with a serious mental health disorder had recently rented a car and left the house, saying that he was going to [place] to be famous. She tearfully shared that lead-up to him leaving. She expressed the worries and fears about what is happening to him, as he will not answer her calls. She reported that due to phone locating services, she knows that he is currently in [place]. She stated that it also feels calmer and more peaceful at home since he left. Nicole Holloway shared that her husband has stopped drinking over the past month and that it has had a significant positive impact on the whole family.   Suicidal/Homicidal: Nowithout intent/plan  Therapist Response: LCSW utilized supportive counseling techniques throughout the session in order to validate emotions and encourage open expression of emotion. LCSW and Mirka processed about her fears regarding her son. LCSW encouraged her to engage in good self-care over the next few days.  Plan: Return again in 2 weeks.    [name], LCSW 07/07/2017

## 2017-07-11 ENCOUNTER — Other Ambulatory Visit: Payer: Self-pay

## 2017-07-11 DIAGNOSIS — R748 Abnormal levels of other serum enzymes: Secondary | ICD-10-CM

## 2017-07-12 LAB — HEPATIC FUNCTION PANEL
ALBUMIN: 4.1 g/dL (ref 3.5–5.5)
ALT: 20 IU/L (ref 0–32)
AST: 13 IU/L (ref 0–40)
Alkaline Phosphatase: 52 IU/L (ref 39–117)
Bilirubin Total: 0.3 mg/dL (ref 0.0–1.2)
Bilirubin, Direct: 0.08 mg/dL (ref 0.00–0.40)
TOTAL PROTEIN: 6.6 g/dL (ref 6.0–8.5)

## 2017-07-13 ENCOUNTER — Ambulatory Visit
Admission: RE | Admit: 2017-07-13 | Discharge: 2017-07-13 | Disposition: A | Discharge status: Home / Self Care | Payer: Self-pay | Source: Ambulatory Visit | Attending: Obstetrics and Gynecology | Admitting: Obstetrics and Gynecology

## 2017-07-13 ENCOUNTER — Encounter (HOSPITAL_COMMUNITY): Payer: Self-pay

## 2017-07-13 ENCOUNTER — Ambulatory Visit (HOSPITAL_COMMUNITY)
Admission: RE | Admit: 2017-07-13 | Discharge: 2017-07-13 | Disposition: A | Discharge status: Home / Self Care | Payer: Self-pay | Source: Ambulatory Visit | Attending: Obstetrics and Gynecology | Admitting: Obstetrics and Gynecology

## 2017-07-13 VITALS — BP 108/64 | Wt 165.4 lb

## 2017-07-13 DIAGNOSIS — Z853 Personal history of malignant neoplasm of breast: Secondary | ICD-10-CM

## 2017-07-13 DIAGNOSIS — Z1239 Encounter for other screening for malignant neoplasm of breast: Secondary | ICD-10-CM

## 2017-07-13 NOTE — Patient Instructions (Addendum)
Explained breast self awareness with Bernie Avalos-Ramos. Patient did not need a Pap smear today due to last Pap smear was 02/25/2016. Let her know BCCCP will cover Pap smears every 3 years unless has a history of abnormal Pap smears. Referred patient to the [facility] for diagnostic mammogram per recommendation for history of lumpectomy for breast cancer. Appointment scheduled for Thursday, July 13, 2017 at 1140. Bell Arthur verbalized understanding.  Avid Guillette, Arvil Chaco, RN 12:16 PM

## 2017-07-13 NOTE — Progress Notes (Signed)
No complaints today.   Pap Smear: Pap smear not completed today. Last Pap smear was 02/25/2016 at the [facility] and normal. Per patient has no history of an abnormal Pap smear. Last Pap smear result is in EPIC.  Physical exam: Breasts Left breast slightly larger than right breast due to history of right breast lumpectomy 06/16/2015. No skin abnormalities left breast. Scar observed on right upper outer breast from lumpectomy and skin darkened right breast due to history of radiation. No nipple retraction bilateral breasts. No nipple discharge bilateral breasts. No lymphadenopathy. No lumps palpated bilateral breasts. No complaints of pain or tenderness on exam. Referred patient to the [facility] for diagnostic mammogram per recommendation for history of lumpectomy for breast cancer. Appointment scheduled for Thursday, July 13, 2017 at 1140.   Pelvic/Bimanual No Pap smear completed today since last Pap smear was 02/25/2016. Pap smear not indicated per BCCCP guidelines.   Smoking History: Patient has never smoked.  Patient Navigation: Patient education provided. Access to services provided for patient through [facility] program. Spanish interpreter provided.   Breast and Cervical Cancer Risk Assessment: Patient has no family history of breast cancer but patient has history of breast cancer. Patient has a known BRCA genetic mutations. Patient has no history of radiation treatment to the chest before age 15. Patient has no history of cervical dysplasia, immunocompromised, or DES exposure in-utero. Breast cancer risk assessment completed. Risk not calculated due to patients history of the BRCA mutation.   Used Spanish interpreter [employer] from [place].

## 2017-07-17 ENCOUNTER — Encounter (HOSPITAL_COMMUNITY): Payer: Self-pay | Admitting: *Deleted

## 2017-07-17 MED FILL — TAMOXIFEN 20 MG TABLET: 20 | 90 days supply | Qty: 90 | Fill #3

## 2017-07-21 ENCOUNTER — Ambulatory Visit: Payer: Self-pay | Admitting: Licensed Clinical Social Worker

## 2017-07-21 DIAGNOSIS — F32A Depression, unspecified: Secondary | ICD-10-CM

## 2017-07-21 DIAGNOSIS — F329 Major depressive disorder, single episode, unspecified: Secondary | ICD-10-CM

## 2017-07-27 NOTE — Progress Notes (Signed)
   THERAPY PROGRESS NOTE  Session Time: 28min  Participation Level: Active  Behavioral Response: Neat and Well GroomedAlertDepressed  Type of Therapy: Individual Therapy  Treatment Goals addressed: Coping  Interventions: Strength-based and Supportive  Summary: Nicole Holloway is a 47 y.o. female who presents with a depressed mood and appropriate affect. She reported that her adult son is still not back home, and appears to be in [place]. She shared a letter that had arrived at her house stating that he had been charged with a DUI. She became tearful as she shared the pain she feels knowing that he is making unsafe choices and that others won't understand that he has a mental illness. She shared about the guilt that she feels, seeing that her daughter is so much happier without her older brother at home causing problems for the family. She shared that she is struggling to take care of herself physically and emotionally but that she is trying. She committed to walking daily.   Suicidal/Homicidal: Nowithout intent/plan  Therapist Response: LCSW utilized supportive counseling techniques throughout the session in order to validate emotions and encourage open expression of emotion. LCSW and [name] processed about her complicated emotions. LCSW encouraged her to try to do as much self-care as she can in this difficult time.  Plan: Return again in 2 weeks.    [name], LCSW 07/27/2017

## 2017-08-04 ENCOUNTER — Ambulatory Visit: Payer: Self-pay | Admitting: Licensed Clinical Social Worker

## 2017-08-04 DIAGNOSIS — F329 Major depressive disorder, single episode, unspecified: Secondary | ICD-10-CM

## 2017-08-04 DIAGNOSIS — F32A Depression, unspecified: Secondary | ICD-10-CM

## 2017-08-07 NOTE — Progress Notes (Signed)
   THERAPY PROGRESS NOTE  Session Time: 12min  Participation Level: Active  Behavioral Response: CasualAlertEuthymic  Type of Therapy: Individual Therapy  Treatment Goals addressed: Coping  Interventions: Strength-based and Supportive  Summary: Nicole Holloway is a 47 y.o. female who presents with a euthymic mood and appropriate affect. She reported that she has been feeling somewhat better over the past few weeks. She shared that her adult son came home out of the blue to pick up some of his stuff. She described their interaction as strained, as he only stayed at the house for a few minutes and would not share anything about where he has been. She stated that she feels worried about him still but is starting to realize that she has to let him go, as there is nothing that she can do besides love and support him. Nicole Holloway also shared her joy that her husband has now been sober for over a month and appears very committed to his recovery. She reported that they now talk more, spend more time together, and enjoy each other's company.   Suicidal/Homicidal: Nowithout intent/plan  Therapist Response: LCSW utilized supportive counseling techniques throughout the session in order to validate emotions and encourage open expression of emotion. LCSW and Nicole Holloway processed about her ongoing and changing feelings regarding her adult son. LCSW and Nicole Holloway discussed her excitement about her husband's recovery, and how she can support him.  Plan: Return again in 2 weeks.    [name], LCSW 08/07/2017

## 2017-08-16 ENCOUNTER — Other Ambulatory Visit: Payer: Self-pay | Admitting: Licensed Clinical Social Worker

## 2017-08-22 ENCOUNTER — Ambulatory Visit: Payer: Self-pay | Admitting: Licensed Clinical Social Worker

## 2017-08-22 DIAGNOSIS — F329 Major depressive disorder, single episode, unspecified: Secondary | ICD-10-CM

## 2017-08-22 DIAGNOSIS — F32A Depression, unspecified: Secondary | ICD-10-CM

## 2017-08-24 NOTE — Progress Notes (Signed)
   THERAPY PROGRESS NOTE  Session Time: 71min   Participation Level: Active  Behavioral Response: Neat and Well GroomedAlertDepressed  Type of Therapy: Individual Therapy  Treatment Goals addressed: Coping  Interventions: Strength-based and Supportive  Summary: Nicole Holloway is a 47 y.o. female who presents with a depressed mood and appropriate affect. She reported that she is continuing to struggle with the stress and worry around her adult son, who has returned to [place] and appears to be out of money. She shared about his phone calls asking for money and stated that she has decided to pay his phone bill but not send money. She became tearful as she shared her worries about what will happen to him if he doesn't address his mental health concerns. She shared that she sent her adolescent daughter to [place] so that she could enjoy her summer and not continue to worry so much. She reported that she and her husband are getting along well and enjoying their time together, despite their worries.   Suicidal/Homicidal: Nowithout intent/plan  Therapist Response: LCSW utilized supportive counseling techniques throughout the session in order to validate emotions and encourage open expression of emotion. LCSW and Nicole processed about her current stressors and coping skills.  Plan: Return again in 2 weeks.    [name], LCSW 08/24/2017

## 2017-09-12 ENCOUNTER — Other Ambulatory Visit: Payer: Self-pay | Admitting: Licensed Clinical Social Worker

## 2017-09-13 ENCOUNTER — Encounter (HOSPITAL_COMMUNITY): Payer: Self-pay | Admitting: Emergency Medicine

## 2017-09-13 ENCOUNTER — Emergency Department (HOSPITAL_COMMUNITY)
Admission: EM | Admit: 2017-09-13 | Discharge: 2017-09-13 | Disposition: A | Discharge status: Home / Self Care | Payer: No Typology Code available for payment source | Attending: Emergency Medicine | Admitting: Emergency Medicine

## 2017-09-13 DIAGNOSIS — Z79899 Other long term (current) drug therapy: Secondary | ICD-10-CM | POA: Insufficient documentation

## 2017-09-13 DIAGNOSIS — S161XXA Strain of muscle, fascia and tendon at neck level, initial encounter: Secondary | ICD-10-CM | POA: Insufficient documentation

## 2017-09-13 DIAGNOSIS — M7918 Myalgia, other site: Secondary | ICD-10-CM | POA: Insufficient documentation

## 2017-09-13 DIAGNOSIS — Y999 Unspecified external cause status: Secondary | ICD-10-CM | POA: Insufficient documentation

## 2017-09-13 DIAGNOSIS — Y9389 Activity, other specified: Secondary | ICD-10-CM | POA: Insufficient documentation

## 2017-09-13 DIAGNOSIS — Z853 Personal history of malignant neoplasm of breast: Secondary | ICD-10-CM | POA: Insufficient documentation

## 2017-09-13 DIAGNOSIS — Y929 Unspecified place or not applicable: Secondary | ICD-10-CM | POA: Insufficient documentation

## 2017-09-13 MED ORDER — IBUPROFEN 600 MG PO TABS: 600.0000 mg | ORAL_TABLET | Freq: Four times a day (QID) | ORAL | 0 refills | Status: DC | PRN

## 2017-09-13 MED ORDER — CYCLOBENZAPRINE HCL 10 MG PO TABS: 10.0000 mg | ORAL_TABLET | Freq: Two times a day (BID) | ORAL | 0 refills | Status: DC | PRN

## 2017-09-13 MED ORDER — IBUPROFEN 800 MG PO TABS
800.0000 mg | ORAL_TABLET | Freq: Once | ORAL | Status: AC
  Administered 2017-09-13: 800 mg via ORAL
  Filled 2017-09-13: qty 1
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
## 2017-09-13 NOTE — Discharge Instructions (Addendum)
Home to rest.  Take Motrin and Flexeril as prescribed as needed.  Apply warm compresses to sore muscles for 20 minutes at a time.  Follow-up with PCP.

## 2017-09-21 ENCOUNTER — Ambulatory Visit: Payer: Self-pay | Admitting: Licensed Clinical Social Worker

## 2017-09-21 DIAGNOSIS — F32A Depression, unspecified: Secondary | ICD-10-CM

## 2017-09-21 DIAGNOSIS — F329 Major depressive disorder, single episode, unspecified: Secondary | ICD-10-CM

## 2017-09-25 NOTE — Progress Notes (Signed)
   THERAPY PROGRESS NOTE  Session Time: 25min  Participation Level: Active  Behavioral Response: Neat and Well GroomedAlertEuthymic  Type of Therapy: Individual Therapy  Treatment Goals addressed: Coping  Interventions: Strength-based and Supportive  Summary: Nicole Holloway is a 47 y.o. female who presents with a euthymic mood and appropriate affect. She reported that she is doing okay lately but that it has been difficult because her son's phone has turned off and she can no longer track his location. She shared about the increased worries that she feels due to not knowing where he is at the very least. She stated that he has not been in touch for almost three weeks. She shared about her process of giving up her worries and fears to God, as she cannot take any action. She stated that she and her husband have decided to file a missing person report today. She became tearful as she shared that she also worries about the impact this situation has on her daughter. [name] stated that she is continuing to connect with her husband and focus on good self-care during this time.   Suicidal/Homicidal: Nowithout intent/plan  Therapist Response: LCSW utilized supportive counseling techniques throughout the session in order to validate emotions and encourage open expression of emotion. LCSW and Jearlene processed about her emotions and how they've changed over time in regards to her son leaving home.  Plan: Return again in 3 weeks.    [name], LCSW 09/25/2017

## 2017-09-29 ENCOUNTER — Ambulatory Visit: Payer: Self-pay | Admitting: Internal Medicine

## 2017-09-29 ENCOUNTER — Encounter: Payer: Self-pay | Admitting: Internal Medicine

## 2017-09-29 VITALS — BP 122/74 | HR 82 | Resp 12 | Ht 61.0 in | Wt 164.0 lb

## 2017-09-29 DIAGNOSIS — M542 Cervicalgia: Secondary | ICD-10-CM

## 2017-09-29 MED ORDER — CYCLOBENZAPRINE HCL 10 MG PO TABS: 10.0000 mg | ORAL_TABLET | Freq: Two times a day (BID) | ORAL | 0 refills | Status: DC | PRN
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
## 2017-10-09 ENCOUNTER — Ambulatory Visit: Payer: Self-pay | Admitting: Licensed Clinical Social Worker

## 2017-10-09 DIAGNOSIS — F329 Major depressive disorder, single episode, unspecified: Secondary | ICD-10-CM

## 2017-10-09 DIAGNOSIS — F32A Depression, unspecified: Secondary | ICD-10-CM

## 2017-10-10 ENCOUNTER — Other Ambulatory Visit: Payer: Self-pay

## 2017-10-10 ENCOUNTER — Ambulatory Visit: Payer: Self-pay | Attending: Internal Medicine | Admitting: Physical Therapy

## 2017-10-10 ENCOUNTER — Other Ambulatory Visit: Payer: Self-pay | Admitting: Licensed Clinical Social Worker

## 2017-10-10 DIAGNOSIS — M25512 Pain in left shoulder: Secondary | ICD-10-CM | POA: Insufficient documentation

## 2017-10-10 DIAGNOSIS — M542 Cervicalgia: Secondary | ICD-10-CM | POA: Insufficient documentation
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
## 2017-10-12 ENCOUNTER — Ambulatory Visit: Payer: No Typology Code available for payment source | Admitting: Physical Therapy

## 2017-10-12 DIAGNOSIS — M25512 Pain in left shoulder: Secondary | ICD-10-CM

## 2017-10-12 DIAGNOSIS — M542 Cervicalgia: Secondary | ICD-10-CM

## 2017-10-12 NOTE — Therapy (Signed)
[place] [address] [place], [place], [address] Phone: [phone]   Fax:  [phone]  Physical Therapy Treatment  Patient Details  Name: Nicole Holloway MRN: [id number] Date of Birth: 1970-11-12 Referring Provider (PT): Mack Hook   Encounter Date: 10/12/2017  PT End of Session - 10/12/17 1427    Visit Number  2    Date for PT Re-Evaluation  11/21/17    PT Start Time  9191    PT Stop Time  1440    PT Time Calculation (min)  52 min    Activity Tolerance  Patient tolerated treatment well    Behavior During Therapy  [facility] for tasks assessed/performed       Past Medical History:  Diagnosis Date  . Abnormal mammogram with microcalcification 07/03/2014   Left Breast calcifications  . Breast cancer ([place])   . Breast cancer ([place]) 04/2015   surgery, radiation, Tamoxifen  . Constipation   . Elevated liver enzymes 06/09/2014   AST resolved, ALT almost normal on recheck 07/2014, Abdominal Ultrasound 09/05/2014 with [facility] showed Hepatic steatosis  . External hemorrhoids   . Hepatic steatosis 09/05/2014   With elevated liver enzymes  . History of radiation therapy 08/03/15- 08/28/15   Right Breast  . Hyperglycemia 06/09/2014   101 fasting;  repeat 95  . Hyperlipidemia 06/04/2014  . Obesity 12/01/2016  . Seasonal allergies   . Varicose veins of both lower extremities     Past Surgical History:  Procedure Laterality Date  . BREAST BIOPSY Right 05/14/2015   U/S Core- Malignant  . BREAST BIOPSY Left 05/14/2015   Stereo- Benign  . BREAST LUMPECTOMY Right 06/16/2015  . BREAST LUMPECTOMY WITH RADIOACTIVE SEED AND SENTINEL LYMPH NODE BIOPSY Right 06/16/2015   Procedure: BREAST LUMPECTOMY WITH RADIOACTIVE SEED AND SENTINEL LYMPH NODE BIOPSY;  Surgeon: Erroll Luna, MD;  Location: [facility];  Service: General;  Laterality: Right;  . CESAREAN SECTION  1998    There were no vitals filed for this  visit.  Subjective Assessment - 10/12/17 1351    Subjective  patient reports compliance with HEP but not today yet.     Patient is accompained by:  Interpreter   Theophilus Kinds   Pertinent History  Breast CA    Patient Stated Goals  to get rid of pain    Currently in Pain?  Yes    Pain Score  5     Pain Location  Shoulder    Pain Orientation  Left    Pain Descriptors / Indicators  Patsi Sears Adult PT Treatment/Exercise - 10/12/17 0001      Exercises   Exercises  Shoulder      Shoulder Exercises: ROM/Strengthening   UBE (Upper Arm Bike)  L1 x 4 min 2/2    Other ROM/Strengthening Exercises  wall slide flexion x 10      Shoulder Exercises: Stretch   Corner Stretch  2 reps;30 seconds    Other Shoulder Stretches  UT stretch 2x30 sec seated      Modalities   Modalities  Electrical Stimulation      Electrical Stimulation   Electrical Stimulation Location  Left shoulder    Electrical Stimulation Action  IFC    Electrical Stimulation Parameters  80-150 Hz x 15 min    Electrical Stimulation Goals  Pain  Manual Therapy   Manual Therapy  Soft tissue mobilization;Myofascial release    Soft tissue mobilization  to L UT, levator, post RC, forearm extensors, biceps/triceps    Myofascial Release  to left UQ       Trigger Point Dry Needling - 10/12/17 1553    Consent Given?  Yes   through interpreter   Education Handout Provided  Yes   at IE   Muscles Treated Upper Body  Upper trapezius    Upper Trapezius Response  Twitch reponse elicited;Palpable increased muscle length             PT Short Term Goals - 10/10/17 1031      PT SHORT TERM GOAL #1   Title  I with initial HEP    Time  2    Period  Weeks    Status  New    Target Date  10/24/17      PT SHORT TERM GOAL #2   Title  decreased pain by 50% in the neck and left shoulder    Time  3    Period  Weeks    Status  New    Target Date  10/31/17        PT Long Term Goals -  10/10/17 1033      PT LONG TERM GOAL #1   Title  Patient able to move her neck with 2/10 pain or less.    Time  6    Period  Weeks    Status  New    Target Date  11/21/17      PT LONG TERM GOAL #2   Title  Patient to report no N/T in LUE.    Time  6    Period  Weeks    Status  New      PT LONG TERM GOAL #3   Title  Patient able to use her left shoulder for ADLS with 7/62 pain or less.    Time  6    Period  Weeks    Status  New      PT LONG TERM GOAL #4   Title  Patient to demo 5/5 Left shoulder strength.    Time  6    Period  Weeks    Status  New            Plan - 10/12/17 1554    Clinical Impression Statement  Patient tolerated treatment fairly well. She required continual cues to perform exercises in pain free range. She was very tender with STW/MFR but tolerated fairly deep pressure. She gave consent to DN and had good response in UT. She would benefit from further DN in L RC.    Rehab Potential  Excellent    PT Frequency  2x / week    PT Duration  6 weeks    PT Treatment/Interventions  ADLs/Self Care Home Management;Cryotherapy;Electrical Stimulation;Therapeutic exercise;Neuromuscular re-education;Patient/family education;Manual techniques;Dry needling;Taping    PT Next Visit Plan  STW/manual and/or DN to Left UQ including biceps, flexibility include wrist    PT Home Exercise Plan  doorway, UT/levator stretches, wall slide shoulder flex       Patient will benefit from skilled therapeutic intervention in order to improve the following deficits and impairments:  Increased muscle spasms, Decreased range of motion, Impaired flexibility, Decreased strength, Pain  Visit Diagnosis: Cervicalgia  Acute pain of left shoulder     Problem List Patient Active Problem List   Diagnosis Date Noted  . Rectal bleeding  06/15/2017  . Obesity 12/01/2016  . Eczema 11/20/2015  . Keratosis pilaris 11/20/2015  . Genetic testing 06/12/2015  . Breast cancer of upper-outer  quadrant of right female breast ([place]) 05/20/2015  . Elevated liver enzymes 03/16/2015  . Constipation   . External hemorrhoids   . Varicose veins of both lower extremities   . Seasonal allergies   . Hepatic steatosis 09/05/2014  . Hyperlipidemia 06/04/2014  . DUB (dysfunctional uterine bleeding) 11/18/2013    Chloe Baig PT 10/12/2017, 3:58 PM  [place] [address] [place], [place], [address] Phone: [phone]   Fax:  [phone]  Name: Aliviah Spain MRN: [id number] Date of Birth: August 12, 1970

## 2017-10-17 ENCOUNTER — Ambulatory Visit: Payer: No Typology Code available for payment source | Attending: Internal Medicine | Admitting: Physical Therapy

## 2017-10-17 ENCOUNTER — Encounter: Payer: Self-pay | Admitting: Physical Therapy

## 2017-10-17 DIAGNOSIS — M25512 Pain in left shoulder: Secondary | ICD-10-CM | POA: Diagnosis present

## 2017-10-17 DIAGNOSIS — M542 Cervicalgia: Secondary | ICD-10-CM | POA: Insufficient documentation

## 2017-10-17 NOTE — Patient Instructions (Signed)

## 2017-10-17 NOTE — Therapy (Signed)
[place] [address] [place], [place], [address] Phone: [phone]   Fax:  [phone]  Physical Therapy Treatment  Patient Details  Name: Nicole Holloway MRN: [id number] Date of Birth: December 22, 1970 Referring Provider (PT): Mack Hook   Encounter Date: 10/17/2017  PT End of Session - 10/17/17 1428    Visit Number  2    Date for PT Re-Evaluation  11/21/17    PT Start Time  1345    PT Stop Time  1442    PT Time Calculation (min)  57 min    Activity Tolerance  Patient tolerated treatment well    Behavior During Therapy  [facility] for tasks assessed/performed       Past Medical History:  Diagnosis Date  . Abnormal mammogram with microcalcification 07/03/2014   Left Breast calcifications  . Breast cancer ([place])   . Breast cancer ([place]) 04/2015   surgery, radiation, Tamoxifen  . Constipation   . Elevated liver enzymes 06/09/2014   AST resolved, ALT almost normal on recheck 07/2014, Abdominal Ultrasound 09/05/2014 with [facility] showed Hepatic steatosis  . External hemorrhoids   . Hepatic steatosis 09/05/2014   With elevated liver enzymes  . History of radiation therapy 08/03/15- 08/28/15   Right Breast  . Hyperglycemia 06/09/2014   101 fasting;  repeat 95  . Hyperlipidemia 06/04/2014  . Obesity 12/01/2016  . Seasonal allergies   . Varicose veins of both lower extremities     Past Surgical History:  Procedure Laterality Date  . BREAST BIOPSY Right 05/14/2015   U/S Core- Malignant  . BREAST BIOPSY Left 05/14/2015   Stereo- Benign  . BREAST LUMPECTOMY Right 06/16/2015  . BREAST LUMPECTOMY WITH RADIOACTIVE SEED AND SENTINEL LYMPH NODE BIOPSY Right 06/16/2015   Procedure: BREAST LUMPECTOMY WITH RADIOACTIVE SEED AND SENTINEL LYMPH NODE BIOPSY;  Surgeon: Erroll Luna, MD;  Location: [place];  Service: General;  Laterality: Right;  . CESAREAN SECTION  1998    There were no vitals filed for this  visit.  Subjective Assessment - 10/17/17 1345    Subjective  "Feeling better"    Patient is accompained by:  Interpreter    Currently in Pain?  No/denies    Pain Score  0-No pain                       [facility] Adult PT Treatment/Exercise - 10/17/17 0001      Exercises   Exercises  Shoulder      Shoulder Exercises: Standing   External Rotation  Theraband;20 reps;Both    Theraband Level (Shoulder External Rotation)  Level 2 (Red)    Flexion  20 reps;Weights;Both    Shoulder Flexion Weight (lbs)  2    ABduction  Both;20 reps;Weights    Shoulder ABduction Weight (lbs)  2    Row  Theraband;15 reps;Strengthening   x2   Theraband Level (Shoulder Row)  Level 3 (Green)      Shoulder Exercises: ROM/Strengthening   UBE (Upper Arm Bike)  L1 x 6 min 3/3    Other ROM/Strengthening Exercises  Rows & lats 20lb 2x10       Modalities   Modalities  Electrical Stimulation;Moist Heat      Moist Heat Therapy   Number Minutes Moist Heat  15 Minutes    Moist Heat Location  Shoulder      Electrical Stimulation   Electrical Stimulation Location  Left shoulder    Electrical Stimulation  Action  IFL    Electrical Stimulation Parameters  supine, to pt tolerance    Electrical Stimulation Goals  Pain               PT Short Term Goals - 10/10/17 1031      PT SHORT TERM GOAL #1   Title  I with initial HEP    Time  2    Period  Weeks    Status  New    Target Date  10/24/17      PT SHORT TERM GOAL #2   Title  decreased pain by 50% in the neck and left shoulder    Time  3    Period  Weeks    Status  New    Target Date  10/31/17        PT Long Term Goals - 10/17/17 1429      PT LONG TERM GOAL #1   Title  Patient able to move her neck with 2/10 pain or less.    Status  Partially Met      PT LONG TERM GOAL #2   Title  Patient to report no N/T in LUE.    Status  On-going      PT LONG TERM GOAL #3   Title  Patient able to use her left shoulder for ADLS with 3/73  pain or less.    Status  Partially Met            Plan - 10/17/17 1429    Clinical Impression Statement  Pt tolerated more exercises interventions. She enters clinic reporting less pain overall. Postural cues needed with standing rows and extensions. Reports the most help from DN.     Rehab Potential  Excellent    PT Frequency  2x / week    PT Duration  6 weeks    PT Treatment/Interventions  ADLs/Self Care Home Management;Cryotherapy;Electrical Stimulation;Therapeutic exercise;Neuromuscular re-education;Patient/family education;Manual techniques;Dry needling;Taping    PT Next Visit Plan  STW/manual and/or DN to Left UQ including biceps, flexibility include wrist       Patient will benefit from skilled therapeutic intervention in order to improve the following deficits and impairments:  Increased muscle spasms, Decreased range of motion, Impaired flexibility, Decreased strength, Pain  Visit Diagnosis: Cervicalgia  Acute pain of left shoulder     Problem List Patient Active Problem List   Diagnosis Date Noted  . Rectal bleeding 06/15/2017  . Obesity 12/01/2016  . Eczema 11/20/2015  . Keratosis pilaris 11/20/2015  . Genetic testing 06/12/2015  . Breast cancer of upper-outer quadrant of right female breast ([place]) 05/20/2015  . Elevated liver enzymes 03/16/2015  . Constipation   . External hemorrhoids   . Varicose veins of both lower extremities   . Seasonal allergies   . Hepatic steatosis 09/05/2014  . Hyperlipidemia 06/04/2014  . DUB (dysfunctional uterine bleeding) 11/18/2013    Scot Jun, PTA 10/17/2017, 2:30 PM  [place] [address] [place], [place], [address] Phone: [phone]   Fax:  [phone]  Name: Nicole Holloway MRN: [id number] Date of Birth: Apr 13, 1970

## 2017-10-19 ENCOUNTER — Ambulatory Visit: Payer: No Typology Code available for payment source | Admitting: Physical Therapy

## 2017-10-19 ENCOUNTER — Encounter: Payer: Self-pay | Admitting: Physical Therapy

## 2017-10-19 DIAGNOSIS — M542 Cervicalgia: Secondary | ICD-10-CM

## 2017-10-19 DIAGNOSIS — M25512 Pain in left shoulder: Secondary | ICD-10-CM

## 2017-10-19 NOTE — Therapy (Signed)
[place], [place], [address] Phone: [phone]   Fax:  [phone]  Physical Therapy Treatment  Patient Details  Name: Nicole Holloway MRN: [id number] Date of Birth: Jun 14, 1970 Referring Provider (PT): Mack Hook   Encounter Date: 10/19/2017  PT End of Session - 10/19/17 1420    Visit Number  3    Date for PT Re-Evaluation  11/21/17    PT Stop Time  8416    Activity Tolerance  Patient tolerated treatment well    Behavior During Therapy  [facility] for tasks assessed/performed       Past Medical History:  Diagnosis Date  . Abnormal mammogram with microcalcification 07/03/2014   Left Breast calcifications  . Breast cancer ([place])   . Breast cancer ([place]) 04/2015   surgery, radiation, Tamoxifen  . Constipation   . Elevated liver enzymes 06/09/2014   AST resolved, ALT almost normal on recheck 07/2014, Abdominal Ultrasound 09/05/2014 with [facility] showed Hepatic steatosis  . External hemorrhoids   . Hepatic steatosis 09/05/2014   With elevated liver enzymes  . History of radiation therapy 08/03/15- 08/28/15   Right Breast  . Hyperglycemia 06/09/2014   101 fasting;  repeat 95  . Hyperlipidemia 06/04/2014  . Obesity 12/01/2016  . Seasonal allergies   . Varicose veins of both lower extremities     Past Surgical History:  Procedure Laterality Date  . BREAST BIOPSY Right 05/14/2015   U/S Core- Malignant  . BREAST BIOPSY Left 05/14/2015   Stereo- Benign  . BREAST LUMPECTOMY Right 06/16/2015  . BREAST LUMPECTOMY WITH RADIOACTIVE SEED AND SENTINEL LYMPH NODE BIOPSY Right 06/16/2015   Procedure: BREAST LUMPECTOMY WITH RADIOACTIVE SEED AND SENTINEL LYMPH NODE BIOPSY;  Surgeon: Erroll Luna, MD;  Location: [place];  Service: General;  Laterality: Right;  . CESAREAN SECTION  1998    There were no vitals filed for this visit.  Subjective Assessment - 10/19/17 1347    Subjective   "Pt reports that she has a little pain from the needles"    Currently in Pain?  No/denies    Pain Score  0-No pain         [facility] PT Assessment - 10/19/17 0001      ROM / Strength   AROM / PROM / Strength  AROM      AROM   Overall AROM   Within functional limits for tasks performed   Some neck pain with L cervical rotation                   [facility] Adult PT Treatment/Exercise - 10/19/17 0001      Exercises   Exercises  Shoulder      Shoulder Exercises: Standing   Horizontal ABduction  Theraband;20 reps    Theraband Level (Shoulder Horizontal ABduction)  Level 2 (Red)    External Rotation  Theraband;20 reps;Both    Theraband Level (Shoulder External Rotation)  Level 2 (Red)    Extension  Theraband;20 reps;Both    Theraband Level (Shoulder Extension)  Level 2 (Red)    Row  Theraband;15 reps;Strengthening   x2   Theraband Level (Shoulder Row)  Level 3 (Green)    Other Standing Exercises  bicep curls 3lb 2x10, shrugs 4lb 2x10     Other Standing Exercises  tricept ext 15lb 2x15      Shoulder Exercises: ROM/Strengthening   UBE (Upper Arm Bike)  L3.5 x 6 min 3/3  Other ROM/Strengthening Exercises  Rows & lats 20lb 2x10       Modalities   Modalities  Electrical Stimulation;Moist Heat      Moist Heat Therapy   Number Minutes Moist Heat  15 Minutes    Moist Heat Location  Shoulder      Electrical Stimulation   Electrical Stimulation Location  Left shoulder    Electrical Stimulation Action  IFC    Electrical Stimulation Parameters  supine    Electrical Stimulation Goals  Pain               PT Short Term Goals - 10/10/17 1031      PT SHORT TERM GOAL #1   Title  I with initial HEP    Time  2    Period  Weeks    Status  New    Target Date  10/24/17      PT SHORT TERM GOAL #2   Title  decreased pain by 50% in the neck and left shoulder    Time  3    Period  Weeks    Status  New    Target Date  10/31/17        PT Long Term Goals - 10/19/17  1420      PT LONG TERM GOAL #3   Title  Patient able to use her left shoulder for ADLS with 9/20 pain or less.    Status  Partially Met            Plan - 10/19/17 1421    Clinical Impression Statement  Pt has progressed increasing her cervical and shoulder ROM to WNL. She did report some L side neck pain with L cervical rotation. Pt had good strength and ROM with all of today's interventions.    Rehab Potential  Excellent    PT Frequency  2x / week    PT Duration  6 weeks    PT Treatment/Interventions  ADLs/Self Care Home Management;Cryotherapy;Electrical Stimulation;Therapeutic exercise;Neuromuscular re-education;Patient/family education;Manual techniques;Dry needling;Taping    PT Next Visit Plan  STW/manual and/or DN to Left UQ including biceps, flexibility include wrist       Patient will benefit from skilled therapeutic intervention in order to improve the following deficits and impairments:  Increased muscle spasms, Decreased range of motion, Impaired flexibility, Decreased strength, Pain  Visit Diagnosis: Cervicalgia  Acute pain of left shoulder     Problem List Patient Active Problem List   Diagnosis Date Noted  . Rectal bleeding 06/15/2017  . Obesity 12/01/2016  . Eczema 11/20/2015  . Keratosis pilaris 11/20/2015  . Genetic testing 06/12/2015  . Breast cancer of upper-outer quadrant of right female breast ([place]) 05/20/2015  . Elevated liver enzymes 03/16/2015  . Constipation   . External hemorrhoids   . Varicose veins of both lower extremities   . Seasonal allergies   . Hepatic steatosis 09/05/2014  . Hyperlipidemia 06/04/2014  . DUB (dysfunctional uterine bleeding) 11/18/2013    Scot Jun 10/19/2017, 2:23 PM  [place] [address] [place], [place], [address] Phone: [phone]   Fax:  [phone]  Name: Nicole Holloway MRN: [id number] Date of Birth: December 11, 1970

## 2017-10-20 MED FILL — TAMOXIFEN CITRATE 20 MG TAB: 20 | 90 days supply | Qty: 90 | Fill #0

## 2017-10-23 ENCOUNTER — Ambulatory Visit: Payer: Self-pay | Admitting: Licensed Clinical Social Worker

## 2017-10-23 DIAGNOSIS — F32A Depression, unspecified: Secondary | ICD-10-CM

## 2017-10-23 DIAGNOSIS — F329 Major depressive disorder, single episode, unspecified: Secondary | ICD-10-CM

## 2017-10-24 ENCOUNTER — Ambulatory Visit: Payer: No Typology Code available for payment source | Admitting: Physical Therapy

## 2017-10-25 NOTE — Progress Notes (Signed)
   THERAPY PROGRESS NOTE  Session Time: 44min  Participation Level: Active  Behavioral Response: Neat and Well GroomedAlertDepressed  Type of Therapy: Individual Therapy  Treatment Goals addressed: Coping  Interventions: Strength-based and Supportive  Summary: Charday Capetillo is a 47 y.o. female who presents with a depressed mood and appropriate affect. She reported that she is doing okay although she continues to struggle with difficult emotions around her son's disappearance. She shared that she has not heard anything from him in over two months. She became tearful as she shared about the pain, doubt, uncertainty, that she feels every day. She shared about the ways that she copes with these feelings: exercise, self-care, and focusing on her husband and daughter. She stated that she is able to reframe negative thoughts by reminding herself that her son might be completely fine and carefree. She shared that her faith keeps her going.   Suicidal/Homicidal: Nowithout intent/plan  Therapist Response: LCSW utilized supportive counseling techniques throughout the session in order to validate emotions and encourage open expression of emotion. LCSW and Dannisha processed about her ongoing emotions re: her son. LCSW provided affirmations for the incredible work [place] is doing to cope with her emotions and reframe negative thoughts.  Plan: Return again in 2 weeks.    [name], LCSW 10/25/2017

## 2017-10-26 ENCOUNTER — Ambulatory Visit: Payer: No Typology Code available for payment source | Admitting: Physical Therapy

## 2017-10-26 DIAGNOSIS — M542 Cervicalgia: Secondary | ICD-10-CM

## 2017-10-26 DIAGNOSIS — M25512 Pain in left shoulder: Secondary | ICD-10-CM

## 2017-10-26 NOTE — Therapy (Signed)
[place], [place], [address] Phone: [phone]   Fax:  [phone]  Physical Therapy Treatment  Patient Details  Name: Nicole Holloway MRN: [id number] Date of Birth: [birth year] Referring Provider (PT): Mack Hook   Encounter Date: 10/26/2017  PT End of Session - 10/26/17 1642    Visit Number  4    Date for PT Re-Evaluation  11/21/17    PT Start Time  8588    PT Stop Time  1655    PT Time Calculation (min)  40 min       Past Medical History:  Diagnosis Date  . Abnormal mammogram with microcalcification 07/03/2014   Left Breast calcifications  . Breast cancer ([place])   . Breast cancer ([place]) 04/2015   surgery, radiation, Tamoxifen  . Constipation   . Elevated liver enzymes 06/09/2014   AST resolved, ALT almost normal on recheck 07/2014, Abdominal Ultrasound 09/05/2014 with [facility] showed Hepatic steatosis  . External hemorrhoids   . Hepatic steatosis 09/05/2014   With elevated liver enzymes  . History of radiation therapy 08/03/15- 08/28/15   Right Breast  . Hyperglycemia 06/09/2014   101 fasting;  repeat 95  . Hyperlipidemia 06/04/2014  . Obesity 12/01/2016  . Seasonal allergies   . Varicose veins of both lower extremities     Past Surgical History:  Procedure Laterality Date  . BREAST BIOPSY Right 05/14/2015   U/S Core- Malignant  . BREAST BIOPSY Left 05/14/2015   Stereo- Benign  . BREAST LUMPECTOMY Right 06/16/2015  . BREAST LUMPECTOMY WITH RADIOACTIVE SEED AND SENTINEL LYMPH NODE BIOPSY Right 06/16/2015   Procedure: BREAST LUMPECTOMY WITH RADIOACTIVE SEED AND SENTINEL LYMPH NODE BIOPSY;  Surgeon: Erroll Luna, MD;  Location: [facility];  Service: General;  Laterality: Right;  . CESAREAN SECTION  1998    There were no vitals filed for this visit.  Subjective Assessment - 10/26/17 1618    Subjective  painfree 2 days. With cerv ROM ext caused alittle pain     Patient is accompained by:  Interpreter    Currently in Pain?  No/denies                       [facility] Adult PT Treatment/Exercise - 10/26/17 0001      Shoulder Exercises: ROM/Strengthening   UBE (Upper Arm Bike)  L3.5 x 6 min 3/3    Other ROM/Strengthening Exercises  Rows & lats 20lb 2x10       Modalities   Modalities  Traction      Traction   Type of Traction  Cervical    Max (lbs)  12    Time  10      Manual Therapy   Manual Therapy  Soft tissue mobilization    Soft tissue mobilization  mulligan SNAGS and NAGS to increase cerv ext             PT Education - 10/26/17 1641    Education Details  cerv retraction,scap stab red tband    Person(s) Educated  Other (comment);Patient    Methods  Explanation;Demonstration;Handout    Comprehension  Verbalized understanding;Returned demonstration       PT Short Term Goals - 10/26/17 1642      PT SHORT TERM GOAL #1   Title  I with initial HEP    Status  Achieved      PT SHORT TERM GOAL #2   Title  decreased  pain by 50% in the neck and left shoulder    Status  Achieved        PT Long Term Goals - 10/26/17 1642      PT LONG TERM GOAL #1   Title  Patient able to move her neck with 2/10 pain or less.    Baseline  full ext causes pain    Status  Partially Met      PT LONG TERM GOAL #2   Title  Patient to report no N/T in LUE.    Status  Achieved      PT LONG TERM GOAL #3   Title  Patient able to use her left shoulder for ADLS with 9/67 pain or less.    Status  Partially Met      PT LONG TERM GOAL #4   Title  Patient to demo 5/5 Left shoulder strength.    Status  Partially Met            Plan - 10/26/17 1643    Clinical Impression Statement  pt verb no pain 2 days. full cerv ROM but a little pain at end range ext- responded well to [place] and added traction as pain was C1-2- responded well. STG met and progressing with LTGs    PT Treatment/Interventions  ADLs/Self Care Home  Management;Cryotherapy;Electrical Stimulation;Therapeutic exercise;Neuromuscular re-education;Patient/family education;Manual techniques;Dry needling;Taping    PT Next Visit Plan  assess and if still doing well look towards D/C       Patient will benefit from skilled therapeutic intervention in order to improve the following deficits and impairments:  Increased muscle spasms, Decreased range of motion, Impaired flexibility, Decreased strength, Pain  Visit Diagnosis: Cervicalgia  Acute pain of left shoulder     Problem List Patient Active Problem List   Diagnosis Date Noted  . Rectal bleeding 06/15/2017  . Obesity 12/01/2016  . Eczema 11/20/2015  . Keratosis pilaris 11/20/2015  . Genetic testing 06/12/2015  . Breast cancer of upper-outer quadrant of right female breast ([place]) 05/20/2015  . Elevated liver enzymes 03/16/2015  . Constipation   . External hemorrhoids   . Varicose veins of both lower extremities   . Seasonal allergies   . Hepatic steatosis 09/05/2014  . Hyperlipidemia 06/04/2014  . DUB (dysfunctional uterine bleeding) 11/18/2013    PAYSEUR,ANGIE PTA 10/26/2017, 4:45 PM  [place], [place], [address] Phone: [phone]   Fax:  [phone]  Name: Nicole Holloway MRN: [id number] Date of Birth: 25-Sep-1970

## 2017-10-27 ENCOUNTER — Ambulatory Visit: Payer: Self-pay | Admitting: Internal Medicine

## 2017-10-27 ENCOUNTER — Encounter: Payer: Self-pay | Admitting: Internal Medicine

## 2017-10-27 VITALS — BP 118/80 | HR 74 | Resp 12 | Ht 61.0 in | Wt 163.0 lb

## 2017-10-27 DIAGNOSIS — B9689 Other specified bacterial agents as the cause of diseases classified elsewhere: Secondary | ICD-10-CM

## 2017-10-27 DIAGNOSIS — B373 Candidiasis of vulva and vagina: Secondary | ICD-10-CM

## 2017-10-27 DIAGNOSIS — N760 Acute vaginitis: Secondary | ICD-10-CM

## 2017-10-27 DIAGNOSIS — N750 Cyst of Bartholin's gland: Secondary | ICD-10-CM

## 2017-10-27 DIAGNOSIS — B3731 Acute candidiasis of vulva and vagina: Secondary | ICD-10-CM

## 2017-10-27 LAB — POCT WET PREP WITH KOH
KOH Prep POC: NEGATIVE
RBC Wet Prep HPF POC: NEGATIVE
Trichomonas, UA: NEGATIVE

## 2017-10-27 MED ORDER — FLUCONAZOLE 150 MG PO TABS: ORAL_TABLET | ORAL | 0 refills | Status: DC

## 2017-10-27 MED ORDER — METRONIDAZOLE 500 MG PO TABS: ORAL_TABLET | ORAL | 0 refills | Status: DC

## 2017-10-27 NOTE — Progress Notes (Signed)
   Subjective:    Patient ID: Nicole Holloway, female    DOB: 1970-10-27, 47 y.o.   MRN: [id number]  HPI   See visit from 06/21/2017  She is not having itching in her inguinal areas any longer, but just where her labia come together in the more anterior vulvar area.   No erythema or flaking in the area.  If she scratches a lot there, it does flake eventually. No grooming performed in the area.  Has also felt a bump in her right labia area.  She applied aloe vera on the area and seemed to get smaller, but she still has the bump.  No pain or itching.   She is having some vaginal discharge.  Yellow and with odor.  She is using daily pantiliners.  Current Meds  Medication Sig  . tamoxifen (NOLVADEX) 20 MG tablet Take 1 tablet (20 mg total) by mouth daily.   No Known Allergies    Review of Systems     Objective:   Physical Exam  GU:  White exudate on walls of vagina and cervix with yellow and watery discharge.  Mild underlying erythema.  No CMT or uterine/adnexal mass or tenderness.  Large cyst in posterior right vulvar area.  No overlying erythema.  Mildly tender.   Wet prep with + budding yeast/clue cells/+ whiff  Assessment & Plan:  1. BV and yeast vaginitis:  Take Metronidazole 500 mg twice daily for 7 days, followed by Fluconazole 150 mg daily for 2 days.   If needed for itching and irritation, OTC topical vaginal yeast cream--3-7 day.    2.  Right Bartholin Duct Cyst:  To notify if does not continue to gradually decrease in size and resolve.  Will need to refer to [facility] if does not .

## 2017-10-31 ENCOUNTER — Ambulatory Visit: Payer: No Typology Code available for payment source | Admitting: Physical Therapy

## 2017-10-31 ENCOUNTER — Encounter: Payer: Self-pay | Admitting: Physical Therapy

## 2017-10-31 DIAGNOSIS — M25512 Pain in left shoulder: Secondary | ICD-10-CM

## 2017-10-31 DIAGNOSIS — M542 Cervicalgia: Secondary | ICD-10-CM

## 2017-10-31 NOTE — Therapy (Signed)
[place], [place], [address] Phone: [phone]   Fax:  [phone]  Physical Therapy Treatment  Patient Details  Name: Nicole Holloway MRN: [id number] Date of Birth: [birth year] Referring Provider (PT): Mack Hook   Encounter Date: 10/31/2017  PT End of Session - 10/31/17 1045    Visit Number  5    Date for PT Re-Evaluation  11/21/17    PT Start Time  6203    PT Stop Time  1100    PT Time Calculation (min)  45 min    Activity Tolerance  Patient tolerated treatment well    Behavior During Therapy  [facility] for tasks assessed/performed       Past Medical History:  Diagnosis Date  . Abnormal mammogram with microcalcification 07/03/2014   Left Breast calcifications  . Breast cancer ([place])   . Breast cancer ([place]) 04/2015   surgery, radiation, Tamoxifen  . Constipation   . Elevated liver enzymes 06/09/2014   AST resolved, ALT almost normal on recheck 07/2014, Abdominal Ultrasound 09/05/2014 with [facility] showed Hepatic steatosis  . External hemorrhoids   . Hepatic steatosis 09/05/2014   With elevated liver enzymes  . History of radiation therapy 08/03/15- 08/28/15   Right Breast  . Hyperglycemia 06/09/2014   101 fasting;  repeat 95  . Hyperlipidemia 06/04/2014  . Obesity 12/01/2016  . Seasonal allergies   . Varicose veins of both lower extremities     Past Surgical History:  Procedure Laterality Date  . BREAST BIOPSY Right 05/14/2015   U/S Core- Malignant  . BREAST BIOPSY Left 05/14/2015   Stereo- Benign  . BREAST LUMPECTOMY Right 06/16/2015  . BREAST LUMPECTOMY WITH RADIOACTIVE SEED AND SENTINEL LYMPH NODE BIOPSY Right 06/16/2015   Procedure: BREAST LUMPECTOMY WITH RADIOACTIVE SEED AND SENTINEL LYMPH NODE BIOPSY;  Surgeon: Erroll Luna, MD;  Location: [facility];  Service: General;  Laterality: Right;  . CESAREAN SECTION  1998    There were no vitals filed for this  visit.  Subjective Assessment - 10/31/17 1018    Subjective  Pt reports that she is doing good and is able to turn her neck now    Currently in Pain?  No/denies         [facility] PT Assessment - 10/31/17 0001      Strength   Left Shoulder Flexion  5/5    Left Shoulder Extension  5/5    Left Shoulder ABduction  5/5    Left Shoulder Internal Rotation  5/5                   [facility] Adult PT Treatment/Exercise - 10/31/17 0001      Shoulder Exercises: Standing   External Rotation  Theraband;20 reps;Both    Theraband Level (Shoulder External Rotation)  Level 2 (Red)    Flexion  20 reps;Weights;Both    ABduction  Both;20 reps;Weights    Shoulder ABduction Weight (lbs)  2    Other Standing Exercises  bicep curls 4lb 2x10      Shoulder Exercises: ROM/Strengthening   UBE (Upper Arm Bike)  L3.5 x 6 min 3/3    Other ROM/Strengthening Exercises  Rows & lats 20lb 2x10       Moist Heat Therapy   Number Minutes Moist Heat  15 Minutes    Moist Heat Location  Cervical      Electrical Stimulation   Electrical Stimulation Location  upper T spine  Electrical Stimulation Action  IFC    Electrical Stimulation Parameters  supine    Electrical Stimulation Goals  Pain               PT Short Term Goals - 10/26/17 1642      PT SHORT TERM GOAL #1   Title  I with initial HEP    Status  Achieved      PT SHORT TERM GOAL #2   Title  decreased pain by 50% in the neck and left shoulder    Status  Achieved        PT Long Term Goals - 10/31/17 1034      PT LONG TERM GOAL #1   Title  Patient able to move her neck with 2/10 pain or less.    Status  Achieved      PT LONG TERM GOAL #2   Title  Patient to report no N/T in LUE.    Status  Achieved      PT LONG TERM GOAL #3   Title  Patient able to use her left shoulder for ADLS with 8/54 pain or less.    Status  Achieved      PT LONG TERM GOAL #4   Title  Patient to demo 5/5 Left shoulder strength.    Status  Achieved             Plan - 10/31/17 1045    Clinical Impression Statement  All goals met, pt reports no functional limitations. She did have a little tightness near inferior angle of L scapula with abduction.    Rehab Potential  Excellent    PT Frequency  2x / week    PT Duration  6 weeks    PT Next Visit Plan  D/C PT       Patient will benefit from skilled therapeutic intervention in order to improve the following deficits and impairments:  Increased muscle spasms, Decreased range of motion, Impaired flexibility, Decreased strength, Pain  Visit Diagnosis: Acute pain of left shoulder  Cervicalgia     Problem List Patient Active Problem List   Diagnosis Date Noted  . Rectal bleeding 06/15/2017  . Obesity 12/01/2016  . Eczema 11/20/2015  . Keratosis pilaris 11/20/2015  . Genetic testing 06/12/2015  . Breast cancer of upper-outer quadrant of right female breast ([place]) 05/20/2015  . Elevated liver enzymes 03/16/2015  . Constipation   . External hemorrhoids   . Varicose veins of both lower extremities   . Seasonal allergies   . Hepatic steatosis 09/05/2014  . Hyperlipidemia 06/04/2014  . DUB (dysfunctional uterine bleeding) 11/18/2013    Scot Jun, PTA 10/31/2017, 10:47 AM  [place], [place], [address] Phone: [phone]   Fax:  [phone]  Name: Nicole Holloway MRN: [id number] Date of Birth: [birth year]

## 2017-11-06 ENCOUNTER — Ambulatory Visit: Payer: Self-pay | Admitting: Licensed Clinical Social Worker

## 2017-11-06 DIAGNOSIS — F329 Major depressive disorder, single episode, unspecified: Secondary | ICD-10-CM

## 2017-11-06 DIAGNOSIS — F32A Depression, unspecified: Secondary | ICD-10-CM

## 2017-11-07 NOTE — Progress Notes (Signed)
   THERAPY PROGRESS NOTE  Session Time: 33min  Participation Level: Active  Behavioral Response: Neat and Well GroomedAlertEuthymic  Type of Therapy: Individual Therapy  Treatment Goals addressed: Coping  Interventions: Strength-based and Supportive  Summary: Nicole Holloway is a 47 y.o. female who presents with a euthymic mood and appropriate affect. She reported that she has been feeling much better since finally hearing from her son after several months without contact. She shared that she received a call from a police officer outside of [place] who had located him and put him on the phone with her. She expressed the relief that she felt to hear his voice, but also her sadness to find out that he is living on the streets. She stated that she encouraged him to contact them more frequently and to come home whenever he is ready. She shared about each family member feeling happier now that he has called a few times. She tearfully expressed the pain that she feels imagining him in the street but also knowing that she has to wait for him to be ready to come home. Eleftheria expressed appreciation for LCSW and the time that they have worked together.   Suicidal/Homicidal: Nowithout intent/plan  Therapist Response: LCSW utilized supportive counseling techniques throughout the session in order to validate emotions and encourage open expression of emotion. LCSW and [place] processed about her mixed emotions regarding her son. LCSW informed Grabiela that LCSW will be leaving the clinic and gave her various options for ongoing counseling. Azuri decided that she will contact other agencies for ongoing counseling; LCSW provided her with contact information for [employer] and [employer].  Plan: [place] to contact other agencies for ongoing counseling.    [name], LCSW 11/07/2017

## 2017-12-28 ENCOUNTER — Encounter: Payer: Self-pay | Admitting: Internal Medicine

## 2017-12-28 ENCOUNTER — Ambulatory Visit: Payer: Self-pay | Admitting: Internal Medicine

## 2017-12-28 VITALS — BP 122/74 | HR 76 | Resp 12 | Ht 61.0 in | Wt 165.0 lb

## 2017-12-28 DIAGNOSIS — M542 Cervicalgia: Secondary | ICD-10-CM

## 2017-12-28 DIAGNOSIS — Z9189 Other specified personal risk factors, not elsewhere classified: Secondary | ICD-10-CM

## 2017-12-28 DIAGNOSIS — Z79899 Other long term (current) drug therapy: Secondary | ICD-10-CM
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
## 2018-02-13 NOTE — Progress Notes (Signed)
Patient Care Team: Mack Hook, MD as PCP - General (Internal Medicine)  DIAGNOSIS:    ICD-10-CM   1. Malignant neoplasm of upper-outer quadrant of right breast in female, estrogen receptor positive ([place]) C50.411 MM DIAG BREAST TOMO BILATERAL   Z17.0     SUMMARY OF ONCOLOGIC HISTORY:   Breast cancer of upper-outer quadrant of right female breast ([place])   05/14/2015 Initial Diagnosis    Right breast biopsy 10:00: IDC with DCIS, grade 1, ER 90%, PR 80%, Ki-67 3%, HER-2 negative ratio 0.93; right breast distortion on mammogram at 10:00 6 x 4 x 4 mm ultrasound axilla negative, T1b N0 stage IA clinical stage    05/28/2015 Genetic Testing    Genetic testing did not identify a deleterious mutation.  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 Genes tested include: APC, ATM, BARD1, BMPR1A, BRCA1, BRCA2, BRIP1, CHD1, CDK4, CDKN2A, CHEK2, EPCAM (large rearrangement only), GREM1, MLH1, MSH2, MSH6, MUTYH, NBN, PALB2, PMS2, POLD1, POLE, PTEN, RAD51C, RAD51D, SMAD4, STK11, and TP53    06/08/2015 Procedure    Genetic testing: VUS on STK11 gene (considered negative). Genes analyzed:  APC, ATM, BARD1, BMPR1A, BRCA1, BRCA2, BRIP1, CHD1, CDK4, CDKN2A, CHEK2, EPCAM (large rearrangement only), GREM1, MLH1, MSH2, MSH6, MUTYH, NBN, PALB2, PMS2, POLD1, POLE, PTEN, RAD51C, RAD51D, SMAD4, STK11, and TP53    06/16/2015 Surgery    Right lumpectomy ([place]): IDC with DCIS, 2.2 cm, 0/3 sentinel nodes negative, T2 N0 stage II a, ER 90%, PR 80%, Ki-67 3%, HER-2 negative ratio 0.93 , Oncotype DX score 10, 7% ROR    06/16/2015 Oncotype testing    Recurrence score: 10; ROR 7% (low-risk)     08/03/2015 - 08/28/2015 Radiation Therapy    Adjuvant radiation therapy Isidore Moos). The Right breast was treated to 40.05 Gy in 15 fractions at 2.67 Gy per fraction. Right breast was boosted to 10 Gy in 5 fractions at 2 Gy per fraction.    09/28/2015 -  Anti-estrogen oral therapy    Tamoxifen 20 mg daily     CHIEF COMPLIANT: Follow-up of tamoxifen  INTERVAL HISTORY: Nicole Holloway is a 48 y.o. with above-mentioned history of right breast cancer treated with lumpectomy and is currently on oral antiestrogen therapy with tamoxifen. I last saw the patient one year ago. Her most recent mammogram from 07/13/17 showed no evidence of malignancy bilaterally. She presents to the clinic alone today. An interpreter was used for the visit. She says her health has been okay but reports pain in her right breast two weeks ago that has since improved but is still present. She denies any side effects of Tamoxifen but notes her menstrual periods have stopped and she has hot flashes at night. She may move back to [place] soon and questioned if she could get Tamoxifen there.  REVIEW OF SYSTEMS:   Constitutional: Denies fevers, chills or abnormal weight loss (+) hot flashes Eyes: Denies blurriness of vision Ears, nose, mouth, throat, and face: Denies mucositis or sore throat Respiratory: Denies cough, dyspnea or wheezes Cardiovascular: Denies palpitation, chest discomfort Gastrointestinal:  Denies nausea, heartburn or change in bowel habits Skin: Denies abnormal skin rashes GYN: (+) menstrual periods stopped Lymphatics: Denies new lymphadenopathy or easy bruising Neurological: Denies numbness, tingling or new weaknesses Behavioral/Psych: Mood is stable, no new changes  Extremities: No lower extremity edema Breast: denies any lumps or nodules in either breasts (+) pain, right breast All other systems were reviewed  with the patient and are negative.  I have reviewed the past medical history, past surgical history, social history and family history with the patient and they are unchanged from previous note.  ALLERGIES:  has No Known Allergies.  MEDICATIONS:  Current Outpatient Medications  Medication Sig Dispense Refill  . fexofenadine  (ALLEGRA) 60 MG tablet Take 60 mg by mouth 2 (two) times daily.    . tamoxifen (NOLVADEX) 20 MG tablet Take 1 tablet (20 mg total) by mouth daily. 90 tablet 3   No current facility-administered medications for this visit.     PHYSICAL EXAMINATION: ECOG PERFORMANCE STATUS: 1 - Symptomatic but completely ambulatory  Vitals:   02/15/18 0844  BP: (!) 95/35  Pulse: 60  Resp: 16  Temp: 97.8 F (36.6 C)  SpO2: 100%   Filed Weights   02/15/18 0844  Weight: 74.6 kg    GENERAL: alert, no distress and comfortable SKIN: skin color, texture, turgor are normal, no rashes or significant lesions EYES: normal, Conjunctiva are pink and non-injected, sclera clear OROPHARYNX: no exudate, no erythema and lips, buccal mucosa, and tongue normal  NECK: supple, thyroid normal size, non-tender, without nodularity LYMPH: no palpable lymphadenopathy in the cervical, axillary or inguinal LUNGS: clear to auscultation and percussion with normal breathing effort HEART: regular rate & rhythm and no murmurs and no lower extremity edema ABDOMEN: abdomen soft, non-tender and normal bowel sounds MUSCULOSKELETAL: no cyanosis of digits and no clubbing  NEURO: alert & oriented x 3 with fluent speech, no focal motor/sensory deficits EXTREMITIES: No lower extremity edema BREAST: No palpable masses or nodules in either right or left breasts. No palpable axillary supraclavicular or infraclavicular adenopathy no breast tenderness or nipple discharge. (exam performed in the presence of a chaperone)  LABORATORY DATA:  I have reviewed the data as listed CMP Latest Ref Rng & Units 07/11/2017 04/13/2017 02/25/2016  Glucose 65 - 99 mg/dL - 88 87  BUN 6 - 24 mg/dL - 9 9  Creatinine 0.57 - 1.00 mg/dL - 0.75 0.71  Sodium 134 - 144 mmol/L - 145(H) 144  Potassium 3.5 - 5.2 mmol/L - 4.7 4.2  Chloride 96 - 106 mmol/L - 107(H) 105  CO2 20 - 29 mmol/L - 25 22  Calcium 8.7 - 10.2 mg/dL - 8.9 9.2  Total Protein 6.0 - 8.5 g/dL 6.6 6.6  7.2  Total Bilirubin 0.0 - 1.2 mg/dL 0.3 0.4 0.4  Alkaline Phos 39 - 117 IU/L 52 56 60  AST 0 - 40 IU/L '13 31 18  ' ALT 0 - 32 IU/L 20 52(H) 26    Lab Results  Component Value Date   WBC 4.3 04/13/2017   HGB 13.5 04/13/2017   HCT 39.8 04/13/2017   MCV 92 04/13/2017   PLT 192 04/13/2017   NEUTROABS 2.5 04/13/2017    ASSESSMENT & PLAN:  Breast cancer of upper-outer quadrant of right female breast ([place]) Right lumpectomy 06/16/2015: IDC with DCIS, 2.2 cm, 0/3 sentinel nodes negative, T2 N0 stage II a, ER 90%, PR 80%, Ki-67 3%, HER-2 negative ratio 0.93  Oncotype DX score 10, 7% risk of recurrence, low risk  Current treatment: 1. Adjuvant radiation therapy started 08/03/2015 followed by 2. Adjuvant antiestrogen therapy with tamoxifen 20 mg dailystarted 09/28/2015  Tamoxifen Toxicities: Denies any hot flashes or myalgias. Renewed tamoxifen for another year  Breast cancer surveillance: 1.  Breast exam 02/15/2018: Benign 2.  Mammogram 07/13/2017: No evidence of malignancy breast density category B Patient may go to [place] next year  and if she does that we can transfer her records to the next doctors in [place].  Return to clinic in 1 year for follow-up      Orders Placed This Encounter  Procedures  . MM DIAG BREAST TOMO BILATERAL    Standing Status:   Future    Standing Expiration Date:   04/16/2019    Order Specific Question:   Reason for Exam (SYMPTOM  OR DIAGNOSIS REQUIRED)    Answer:   Annual mammograms    Order Specific Question:   Is the patient pregnant?    Answer:   No    Order Specific Question:   Preferred imaging location?    Answer:   [facility]   The patient has a good understanding of the overall plan. she agrees with it. she will call with any problems that may develop before the next visit here.  Nicholas Lose, MD 02/15/2018  Julious Oka Dorshimer am acting as scribe for Dr. Nicholas Lose.  I have reviewed the above documentation for accuracy and  completeness, and I agree with the above.

## 2018-02-15 ENCOUNTER — Telehealth: Payer: Self-pay | Admitting: Hematology and Oncology

## 2018-02-15 ENCOUNTER — Inpatient Hospital Stay: Payer: Self-pay | Attending: Hematology and Oncology | Admitting: Hematology and Oncology

## 2018-02-15 DIAGNOSIS — C50411 Malignant neoplasm of upper-outer quadrant of right female breast: Secondary | ICD-10-CM | POA: Insufficient documentation

## 2018-02-15 DIAGNOSIS — Z923 Personal history of irradiation: Secondary | ICD-10-CM | POA: Insufficient documentation

## 2018-02-15 DIAGNOSIS — Z79810 Long term (current) use of selective estrogen receptor modulators (SERMs): Secondary | ICD-10-CM | POA: Insufficient documentation

## 2018-02-15 DIAGNOSIS — Z170 Estrogen receptor positive status [ER+]: Secondary | ICD-10-CM | POA: Insufficient documentation

## 2018-02-15 MED ORDER — TAMOXIFEN CITRATE 20 MG PO TABS: 20.0000 mg | ORAL_TABLET | Freq: Every day | ORAL | 3 refills | Status: DC

## 2018-02-15 MED FILL — TAMOXIFEN CITRATE 20 MG TAB: 20 | 90 days supply | Qty: 90 | Fill #0

## 2018-02-15 NOTE — Assessment & Plan Note (Signed)
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 Adjuvant radiation therapy started 08/03/2015 followed by 2. Adjuvant antiestrogen therapy with tamoxifen 20 mg dailystarted 09/28/2015  Tamoxifen Toxicities: Denies any hot flashes or myalgias. Renewed tamoxifen for another year  Breast cancer surveillance: 1.  Breast exam 02/15/2018: Benign 2.  Mammogram 07/13/2017: No evidence of malignancy breast density category B  Return to clinic in 1 year for follow-up

## 2018-02-15 NOTE — Telephone Encounter (Signed)
Gave avs and calendar ° °

## 2018-04-19 MED FILL — TAMOXIFEN CITRATE 20 MG TAB: 20 | 90 days supply | Qty: 90 | Fill #0

## 2018-05-03 ENCOUNTER — Encounter: Payer: Self-pay | Admitting: Internal Medicine

## 2018-07-02 ENCOUNTER — Other Ambulatory Visit: Payer: Self-pay

## 2018-07-02 ENCOUNTER — Ambulatory Visit: Payer: Self-pay | Admitting: Internal Medicine

## 2018-07-02 ENCOUNTER — Encounter: Payer: Self-pay | Admitting: Internal Medicine

## 2018-07-02 VITALS — BP 118/78 | HR 72 | Resp 12 | Ht 61.0 in | Wt 164.0 lb

## 2018-07-02 DIAGNOSIS — Z9189 Other specified personal risk factors, not elsewhere classified: Secondary | ICD-10-CM

## 2018-07-02 DIAGNOSIS — K0889 Other specified disorders of teeth and supporting structures: Secondary | ICD-10-CM

## 2018-07-02 DIAGNOSIS — E782 Mixed hyperlipidemia: Secondary | ICD-10-CM

## 2018-07-02 DIAGNOSIS — Z8639 Personal history of other endocrine, nutritional and metabolic disease: Secondary | ICD-10-CM

## 2018-07-02 DIAGNOSIS — R339 Retention of urine, unspecified: Secondary | ICD-10-CM

## 2018-07-02 DIAGNOSIS — N750 Cyst of Bartholin's gland: Secondary | ICD-10-CM

## 2018-07-02 DIAGNOSIS — Z0000 Encounter for general adult medical examination without abnormal findings: Secondary | ICD-10-CM

## 2018-07-02 DIAGNOSIS — N949 Unspecified condition associated with female genital organs and menstrual cycle: Secondary | ICD-10-CM

## 2018-07-02 LAB — POCT URINALYSIS DIPSTICK
Bilirubin, UA: NEGATIVE
Glucose, UA: NEGATIVE
Ketones, UA: NEGATIVE
Nitrite, UA: NEGATIVE
Protein, UA: NEGATIVE
Spec Grav, UA: 1.02 (ref 1.010–1.025)
Urobilinogen, UA: 0.2 E.U./dL
pH, UA: 6 (ref 5.0–8.0)

## 2018-07-02 MED ORDER — CALCIUM CITRATE + D 250-200 MG-UNIT PO TABS: ORAL_TABLET | ORAL | Status: DC

## 2018-07-02 NOTE — Progress Notes (Signed)
Subjective:    Patient ID: Nicole Holloway, female   DOB: Aug 05, 1970, 48 y.o.   MRN: [id number]   HPI   CPE with pap  1.  Pap:  Last in 02/25/2016 and normal.  No family history of cervical cancer.    2.  Mammogram:  History of breast cancer in right breast 2017.  Has mammogram scheduled for end of this month.  She is followed at the [facility] now once yearly.  She has not felt anything new on self breast exam.  No family history of breast cancer.    3.  Osteoprevention:  Takes Tamoxifen.  She is heading into perimenopause--having night sweats in recent months and periods stretching out--no period from November until her last period in May.  She drinks Almond milk, but does not like it.  Recommended she not drink soy due to the estrogen content.   Walking 45 minutes every morning and jumps rope. She has not undergone DEXA at this time.  4.  Guaiac Cards:  Positive for blood last year which led to colonoscopy, which was normal.  5.  Colonoscopy:  Normal last year 07/04/2017  6.  Immunizations:   Immunization History  Administered Date(s) Administered  . Influenza Inj Mdck Quad Pf 12/01/2016  . Influenza,inj,Quad PF,6+ Mos 02/25/2015, 10/29/2015, 10/19/2017  . Influenza-Unspecified 12/09/2015  . Tdap 02/25/2016     7.  Glucose/Cholesterol:  Both were fine last year.  She would like to have checked again today.   Current Meds  Medication Sig  . tamoxifen (NOLVADEX) 20 MG tablet Take 1 tablet (20 mg total) by mouth daily.   No Known Allergies   Past Medical History:  Diagnosis Date  . Abnormal mammogram with microcalcification 07/03/2014   Left Breast calcifications  . Breast cancer ([place])   . Breast cancer ([place]) 04/2015   surgery, radiation, Tamoxifen  . Constipation   . Elevated liver enzymes 06/09/2014   AST resolved, ALT almost normal on recheck 07/2014, Abdominal Ultrasound 09/05/2014 with [facility] showed Hepatic steatosis  . External hemorrhoids   . Hepatic  steatosis 09/05/2014   With elevated liver enzymes  . History of radiation therapy 08/03/15- 08/28/15   Right Breast  . Hyperglycemia 06/09/2014   101 fasting;  repeat 95  . Hyperlipidemia 06/04/2014  . Obesity 12/01/2016  . Seasonal allergies   . Varicose veins of both lower extremities     Past Surgical History:  Procedure Laterality Date  . BREAST BIOPSY Right 05/14/2015   U/S Core- Malignant  . BREAST BIOPSY Left 05/14/2015   Stereo- Benign  . BREAST LUMPECTOMY Right 06/16/2015  . BREAST LUMPECTOMY WITH RADIOACTIVE SEED AND SENTINEL LYMPH NODE BIOPSY Right 06/16/2015   Procedure: BREAST LUMPECTOMY WITH RADIOACTIVE SEED AND SENTINEL LYMPH NODE BIOPSY;  Surgeon: Erroll Luna, MD;  Location: [facility];  Service: General;  Laterality: Right;  . CESAREAN SECTION  1998    Family History  Problem Relation Age of Onset  . Diabetes Father   . Other Father        prostate issues  . Kidney failure Father 40       started dialysis march 2nd  . Heart disease Father        Heart failure was cause of death.  . Other Mother        osteoarthritis, knee replacement  . Mental illness Son 65       Bipolar vs Schizophrenia  . Other Maternal Aunt  hx benign breast lump  . Kidney failure Maternal Grandmother        d. 56s; diabetes  . Diabetes Maternal Grandmother     Social History   Socioeconomic History  . Marital status: Married    Spouse name: Beatriz Chancellor  . Number of children: 2  . Years of education: 8  . Highest education level: Not on file  Occupational History  . Occupation: housecleaning  Social Needs  . Financial resource strain: Not hard at all  . Food insecurity    Worry: Never true    Inability: Never true  . Transportation needs    Medical: No    Non-medical: No  Tobacco Use  . Smoking status: Never Smoker  . Smokeless tobacco: Never Used  Substance and Sexual Activity  . Alcohol use: No    Frequency: Never    Comment: Rare  . Drug use:  No  . Sexual activity: Yes    Birth control/protection: None  Lifestyle  . Physical activity    Days per week: 7 days    Minutes per session: 60 min  . Stress: Not at all  Relationships  . Social Herbalist on phone: Twice a week    Gets together: Twice a week    Attends religious service: 1 to 4 times per year    Active member of club or organization: No    Attends meetings of clubs or organizations: Never    Relationship status: Married  . Intimate partner violence    Fear of current or ex partner: No    Emotionally abused: No    Physically abused: No    Forced sexual activity: No  Other Topics Concern  . Not on file  Social History Narrative   LIves at home with husband and two kids.     Son, Elita Quick, now with better control of his health issues      Review of Systems  Constitutional: Negative for appetite change, fatigue and fever.  HENT: Positive for dental problem (Pain in upper right tooth--molar.  ). Negative for ear pain, hearing loss, rhinorrhea, sneezing and sore throat.   Eyes: Positive for visual disturbance (Struggling with reading currently.  Has tried reading glasses, but was most recently using what sounds like bifocals.  Sounds like she had an appt with eye doctor in March and received voucher subsequently.  ). Negative for itching.  Respiratory: Negative for cough and shortness of breath.   Cardiovascular: Positive for leg swelling (Occasionally when sitting too long.  ). Negative for chest pain and palpitations.  Gastrointestinal: Negative for abdominal pain, blood in stool (No melena.), constipation and diarrhea.  Genitourinary: Positive for menstrual problem (No period since November.). Negative for dysuria.       Often gets an incomplete emptying sensation with urination.  Since arch.  No flank discomfort.  No urinary odor unless does not drink enough water.  Musculoskeletal: Negative for arthralgias.  Skin: Negative for rash (Had a rash at her  posterior waistline and buttocks before her last period in May.  Resolved once period started.    ).  Neurological: Negative for weakness and numbness.  Psychiatric/Behavioral: Negative for dysphoric mood. The patient is not nervous/anxious.       Objective:   BP 118/78 (BP Location: Left Arm, Patient Position: Sitting, Cuff Size: Normal)   Pulse 72   Resp 12   Ht 5\' 1"  (1.549 m)   Wt 164 lb (74.4 kg)   LMP  06/12/2018   BMI 30.99 kg/m   Physical Exam  Constitutional: She is oriented to person, place, and time. She appears well-developed and well-nourished.  HENT:  Head: Normocephalic and atraumatic.  Right Ear: Hearing, tympanic membrane, external ear and ear canal normal.  Left Ear: Hearing, tympanic membrane, external ear and ear canal normal.  Nose: Nose normal.  Mouth/Throat: Uvula is midline, oropharynx is clear and moist and mucous membranes are normal. Dental caries present.  Eyes: Pupils are equal, round, and reactive to light. Conjunctivae and EOM are normal.  Discs sharp bilaterally.  Neck: Normal range of motion and full passive range of motion without pain. Neck supple. No thyroid mass and no thyromegaly present.  Cardiovascular: Normal rate, regular rhythm, S1 normal and S2 normal. Exam reveals no S3, no S4 and no friction rub.  No murmur heard. No carotid bruits.  Carotid, radial, femoral, DP and PT pulses normal and equal.   Pulmonary/Chest: Effort normal and breath sounds normal. Right breast exhibits no inverted nipple, no mass, no nipple discharge, no skin change and no tenderness. Left breast exhibits no inverted nipple, no mass, no nipple discharge, no skin change and no tenderness.  Abdominal: Soft. Bowel sounds are normal. She exhibits no mass. There is no hepatosplenomegaly. There is no abdominal tenderness. No hernia.  Genitourinary:    Genitourinary Comments: Normal external female genitalia.  Right 3 cm cystic lesion in right labia without overlying  erythema.  NT.   Tender on palpation of uterus, but also suprapubic area.  No enlargement or mass of uterus. No adnexal mass or tenderness. Rectal:  No mass, faintly guaiac positive light brown stool.     Musculoskeletal: Normal range of motion.  Lymphadenopathy:       Head (right side): No submental and no submandibular adenopathy present.       Head (left side): No submental and no submandibular adenopathy present.    She has no cervical adenopathy.    She has no axillary adenopathy.       Right: No inguinal and no supraclavicular adenopathy present.       Left: No inguinal and no supraclavicular adenopathy present.  Neurological: She is alert and oriented to person, place, and time. She has normal strength and normal reflexes. No cranial nerve deficit or sensory deficit. Coordination and gait normal.  Skin: Skin is warm. No rash (But does have hyperpigmented scarring across upper left buttock where had rash previously.) noted.  Psychiatric: She has a normal mood and affect. Her speech is normal and behavior is normal. Judgment and thought content normal. Cognition and memory are normal.     Assessment & Plan  1.  CPE without pap Mammogram scheduled for later this month. Guaiac Cards x 3 to return in 1 month. CBC, CMP, FLP  2.  Guaiac + stool:  Suspect due to hemorrhoids.  She states she ate red hot peppers last night and caused rectal irritation as well.  She will let this heal and then perform guaiac cards.   Normal colonoscopy 1 year ago as well. CBC  3.  Urinary complaints with slightly abnormal UA-trace blood and leuks.  Urine culture sent.  4.  Uterine cramping and tenderness:  Not clear if tenderness is from uterus or urinary bladder today.  If ends up with UTI with culture, will reevaluate whether needs pelvic U/S.   With her taking Tamoxifen, however, low threshold for proceeding with Ultrasound.  5.  Rash in April/May:  Lasted 1 month.  Appears to have been over left  buttock only.  Perhaps herpes zoster.  To call if recurs.    6.  Dental pain:  Dental referral  7.  Decreased Visual Acuity:  Check on getting another voucher to replace lost one for eyeglasses.  8.  Bartholin Gland Cyst:  Remains large--referral to Gynecology

## 2018-07-03 LAB — COMPREHENSIVE METABOLIC PANEL
ALT: 63 IU/L — ABNORMAL HIGH (ref 0–32)
AST: 36 IU/L (ref 0–40)
Albumin/Globulin Ratio: 1.9 (ref 1.2–2.2)
Albumin: 4.4 g/dL (ref 3.8–4.8)
Alkaline Phosphatase: 75 IU/L (ref 39–117)
BUN/Creatinine Ratio: 16 (ref 9–23)
BUN: 11 mg/dL (ref 6–24)
Bilirubin Total: 0.3 mg/dL (ref 0.0–1.2)
CO2: 23 mmol/L (ref 20–29)
Calcium: 8.7 mg/dL (ref 8.7–10.2)
Chloride: 106 mmol/L (ref 96–106)
Creatinine, Ser: 0.69 mg/dL (ref 0.57–1.00)
GFR calc Af Amer: 120 mL/min/{1.73_m2} (ref >59)
GFR calc non Af Amer: 104 mL/min/{1.73_m2} (ref >59)
Globulin, Total: 2.3 g/dL (ref 1.5–4.5)
Glucose: 80 mg/dL (ref 65–99)
Potassium: 4 mmol/L (ref 3.5–5.2)
Sodium: 141 mmol/L (ref 134–144)
Total Protein: 6.7 g/dL (ref 6.0–8.5)

## 2018-07-03 LAB — CBC WITH DIFFERENTIAL/PLATELET
Basophils Absolute: 0 10*3/uL (ref 0.0–0.2)
Basos: 1 %
EOS (ABSOLUTE): 0.1 10*3/uL (ref 0.0–0.4)
Eos: 2 %
Hematocrit: 37.6 % (ref 34.0–46.6)
Hemoglobin: 13 g/dL (ref 11.1–15.9)
Immature Grans (Abs): 0 10*3/uL (ref 0.0–0.1)
Immature Granulocytes: 0 %
Lymphocytes Absolute: 1.8 10*3/uL (ref 0.7–3.1)
Lymphs: 34 %
MCH: 31.6 pg (ref 26.6–33.0)
MCHC: 34.6 g/dL (ref 31.5–35.7)
MCV: 92 fL (ref 79–97)
Monocytes Absolute: 0.4 10*3/uL (ref 0.1–0.9)
Monocytes: 8 %
Neutrophils Absolute: 2.9 10*3/uL (ref 1.4–7.0)
Neutrophils: 55 %
Platelets: 179 10*3/uL (ref 150–450)
RBC: 4.11 x10E6/uL (ref 3.77–5.28)
RDW: 13.1 % (ref 11.7–15.4)
WBC: 5.3 10*3/uL (ref 3.4–10.8)

## 2018-07-03 LAB — LIPID PANEL W/O CHOL/HDL RATIO
Cholesterol, Total: 178 mg/dL (ref 100–199)
HDL: 37 mg/dL — ABNORMAL LOW (ref >39)
LDL Calculated: 107 mg/dL — ABNORMAL HIGH (ref 0–99)
Triglycerides: 170 mg/dL — ABNORMAL HIGH (ref 0–149)
VLDL Cholesterol Cal: 34 mg/dL (ref 5–40)

## 2018-07-04 ENCOUNTER — Telehealth: Payer: Self-pay | Admitting: Obstetrics & Gynecology

## 2018-07-04 LAB — URINE CULTURE

## 2018-07-04 MED ORDER — CIPROFLOXACIN HCL 500 MG PO TABS: ORAL_TABLET | ORAL | 0 refills | Status: DC

## 2018-07-04 NOTE — Addendum Note (Signed)
Addended by: Marcelino Duster on: 07/04/2018 09:16 AM   Modules accepted: Orders

## 2018-07-04 NOTE — Telephone Encounter (Signed)
Patient was called by Eda, and instructed about her visit for 07/23/2018.

## 2018-07-13 ENCOUNTER — Other Ambulatory Visit (INDEPENDENT_AMBULATORY_CARE_PROVIDER_SITE_OTHER): Payer: Self-pay

## 2018-07-13 ENCOUNTER — Other Ambulatory Visit: Payer: Self-pay

## 2018-07-13 DIAGNOSIS — Z1211 Encounter for screening for malignant neoplasm of colon: Secondary | ICD-10-CM

## 2018-07-13 LAB — POC HEMOCCULT BLD/STL (HOME/3-CARD/SCREEN)
Card #2 Fecal Occult Blod, POC: NEGATIVE
Card #3 Fecal Occult Blood, POC: NEGATIVE
Fecal Occult Blood, POC: POSITIVE — AB

## 2018-07-18 MED FILL — TAMOXIFEN CITRATE 20 MG TAB: 20 | 90 days supply | Qty: 90 | Fill #1

## 2018-07-19 ENCOUNTER — Telehealth: Payer: Self-pay | Admitting: Obstetrics and Gynecology

## 2018-07-19 NOTE — Telephone Encounter (Signed)
Called the patient to complete the pre-screen. The patient answered no to COVID19 symptoms and/or being previously diagnosed. Informed the patient of the wearing a face mask, sanitizing hands at the sanitizing station upon entering our office, and no visitors or children are allowed due to the [place] restrictions. The patient verbalized understanding.  Called with in office interrupter- [place]

## 2018-07-23 ENCOUNTER — Ambulatory Visit (INDEPENDENT_AMBULATORY_CARE_PROVIDER_SITE_OTHER): Payer: Self-pay | Admitting: Obstetrics & Gynecology

## 2018-07-23 ENCOUNTER — Other Ambulatory Visit (HOSPITAL_COMMUNITY): Payer: Self-pay | Admitting: *Deleted

## 2018-07-23 ENCOUNTER — Encounter: Payer: Self-pay | Admitting: Obstetrics & Gynecology

## 2018-07-23 ENCOUNTER — Other Ambulatory Visit: Payer: Self-pay

## 2018-07-23 VITALS — BP 105/44 | HR 65 | Temp 98.6°F | Ht 63.0 in | Wt 164.9 lb

## 2018-07-23 DIAGNOSIS — Z853 Personal history of malignant neoplasm of breast: Secondary | ICD-10-CM

## 2018-07-23 DIAGNOSIS — N751 Abscess of Bartholin's gland: Secondary | ICD-10-CM | POA: Insufficient documentation

## 2018-07-23 DIAGNOSIS — N750 Cyst of Bartholin's gland: Secondary | ICD-10-CM

## 2018-07-23 DIAGNOSIS — N898 Other specified noninflammatory disorders of vagina: Secondary | ICD-10-CM

## 2018-07-23 NOTE — Progress Notes (Signed)
Bartholin Cyst I&D and Word Catheter Placement 48 y.o. [id number] Patient's last menstrual period was 06/12/2018 (exact date). 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  Written informed consent was obtained.  Discussed complications and possible outcomes of procedure including recurrence of cyst, scarring leading to infection, bleeding, dyspareunia, distortion of anatomy.  Patient was examined in the dorsal lithotomy position and mass was identified.  The area was prepped with Iodine and draped in a sterile manner. 1% Lidocaine (3 ml) was then used to infiltrate area on top of the cyst, behind the hymenal ring.  A 7 mm incision was made using a sterile scapel. Upon palpation of the mass, a moderate amount of bloody purulent drainage was expressed through the incision. A hemostat was used to break up loculations, which resulted in expression of more bloody purulent drainage.  . Word catheter was placed. 1.5 ml of sterile water was used to inflate the catheter balloon..  Patient tolerated the procedure well, reported feeling " a lot better." - Bactrim DS bid x 7 days for treatment - Recommended Sitz baths bid and Motrin and Percocet was given  prn pain.   She was told to call to be examined if she experiences increasing swelling, pain, vaginal discharge, or fever.  - She was instructed to wear a peripad to absorb discharge, and to maintain pelvic rest while the Word catheter is in place.  -The catheter will be left in place for at least two to four weeks to promote formation of an epithelialized tract for permanent drainage of glandular secretions.  - She will [facility] from now for removal of word catheter.  Woodroe Mode, MD 07/23/2018

## 2018-07-23 NOTE — Patient Instructions (Signed)
Quiste de [employer] Bartholin's Cyst  Un quiste de Bartolino es un saco lleno de lquido que se forma en una glndula de [place]. Las glndulas de Bartolino son pequeas glndulas ubicadas en los pliegues de la piel alrededor del orificio de la vagina (labios de la vulva). Estas glndulas secretan un lquido que humedece (lubrica) la parte externa de la vagina durante las relaciones sexuales. Un quiste que no es grande ni est infectado puede no causar problemas ni requerir Clinical research associate. Si el quiste se infecta, se denomina absceso de [place]. Un absceso puede causar sntomas como dolor e hinchazn y es ms probable que requiera tratamiento. Cules son las causas? Esta afeccin puede producirse por una glndula de Bartolino obstruida. Estas glndulas pueden obstruirse debido a la acumulacin natural de lquidos y aceites. La presencia de bacterias dentro del quiste puede causar una infeccin. En muchos de [employer], se desconoce la causa. Qu incrementa el riesgo? Puede tener un riesgo ms alto de Actor un quiste o un absceso de Bartolino si:  Est en edad frtil.  Tiene antecedentes de quistes o abscesos de [place].  Tiene diabetes.  Tiene una ITS (infeccin de transmisin sexual). Cules son los signos o los sntomas? Entre los sntomas, se pueden incluir los siguientes:  Un bulto o una hinchazn en los labios de la vulva, cerca del orificio inferior de la vagina.  Molestias o dolor. Esto puede empeorar al [employer] sexuales o al caminar.  Enrojecimiento, hinchazn y secrecin de lquido en el rea. Estos podran ser signos de un absceso. La gravedad de los sntomas depende del tamao del quiste y si est infectado. La infeccin agrava los sntomas. Cmo se diagnostica? Esta afeccin se puede diagnosticar en funcin de lo siguiente:  Los sntomas y los antecedentes mdicos.  Un examen fsico para detectar hinchazn en el rea vaginal. Es posible que deba  acostarse boca arriba en una camilla con los pies en los soportes para que Hotel manager.  Anlisis de sangre para verificar si hay infecciones.  Extraccin de [place] de lquido del quiste o absceso (biopsia) para Geophysical data processor. Es posible que trabaje con un mdico especializado en [place] (gineclogo) para recibir un diagnstico y Clinical research associate. Cmo se trata? Si el quiste es pequeo, no est infectado y no causa sntomas, tal vez no sea necesario Building services engineer. Estos quistes a menudo desaparecen por s solos, con cuidados en el hogar como baos calientes o compresas tibias. Si tiene un quiste o absceso grande, el tratamiento puede incluir lo siguiente:  Antibiticos.  Un procedimiento para drenar el lquido que se encuentra en el interior del quiste o absceso. Estos procedimientos involucran una incisin en el quiste o absceso para drenar el lquido y luego puede realizarse alguno de los siguientes procedimientos: ? Puede colocarse un pequeo tubo delgado (catter) dentro del quiste o absceso para evitar que se cierre y vuelva a llenarse de lquido (fistulizacin). El catter se extraer en la visita de seguimiento. ? Los bordes de la incisin pueden suturarse con puntos en la piel para que el quiste o absceso se mantenga abierto ([place]). Esto permite que siga drenando y no se vuelva a llenar de lquido. Si tiene quistes o abscesos que vuelven a formarse (recurrentes) y han requerido incisin y Advertising account executive veces, el mdico puede plantearle la posibilidad de Ardelia Mems ciruga para extirpar la glndula de [place]. Siga estas indicaciones en su casa: Medicamentos  [employer] de venta libre y los recetados solamente  como se lo haya indicado el mdico.  Si le recetaron un antibitico, tmelo como se lo haya indicado el mdico. No deje de tomar o usar el antibitico aunque la afeccin mejore. Control del dolor y de la hinchazn  Pruebe  con un bao de asiento para Therapist, occupational y la hinchazn. Un bao de asiento es un bao con agua tibia en el cual el agua solo le llega hasta la cadera y debe cubrir las nalgas. Puede tomar baos de asiento varias veces al [employer].  Aplique calor en la zona afectada con la frecuencia que sea necesaria. Use la fuente de calor que el mdico le recomiende, como una compresa de calor hmedo o una almohadilla trmica. ? Coloque una [employer] piel y la fuente de Freight forwarder. ? Aplique calor durante 20 a 90minutos. ? Retire la fuente de calor si la piel se pone de color rojo brillante. Esto es especialmente importante si no puede sentir dolor, calor o fro. Puede correr un riesgo mayor de sufrir quemaduras. Indicaciones generales  Si se dren el quiste o absceso, siga las indicaciones del mdico acerca del cuidado de la herida. Utilice toallas femeninas cuando lo necesite para absorber cualquier secrecin.  No apriete ni haga presin [employer].  No tenga relaciones sexuales hasta que el quiste haya desaparecido o la herida del drenaje haya sanado.  Tome estas medidas para ayudar a prevenir que vuelva a formarse un quiste de Engineer, manufacturing y que se desarrollen otros quistes de Bartolino: ? Tome un bao o una ducha una [place]. Higienice el rea vaginal con agua y un jabn suave cuando se bae. ? Practique el sexo seguro para prevenir las ITS. Hable con el mdico sobre cmo prevenir las ITS y qu formas de mtodos de control de la natalidad (anticonceptivos) pueden ser mejores para usted.  Concurra a todas las visitas de control como se lo haya indicado el mdico. Esto es importante. Comunquese con un mdico si:  Tiene fiebre.  Presenta enrojecimiento, hinchazn o dolor alrededor del quiste.  Tiene lquido, sangre, pus o mal olor que proviene del [place].  Tiene un quiste que [place] de tamao o vuelve a formarse. Resumen  Un quiste de Bartolino es un saco lleno de lquido que se forma en una  glndula de [place]. Estas glndulas se encuentran en los pliegues de la piel alrededor del orificio de la vagina (labios de la vulva).  Si el quiste es pequeo, no est infectado y no causa sntomas, tal vez no sea necesario Building services engineer. Estos quistes a menudo desaparecen por s solos, con cuidados en el hogar como baos calientes o compresas tibias.  Si tiene un quiste o absceso grande, es posible que el mdico realice un procedimiento para Musician lquido.  Si tiene quistes o abscesos que vuelven a formarse (recurrentes) y han requerido incisin y Advertising account executive veces, el mdico puede plantearle la posibilidad de Ardelia Mems ciruga para extirpar la glndula de [place]. Esta informacin no tiene Marine scientist el consejo del mdico. Asegrese de hacerle al mdico cualquier pregunta que tenga. Document Released: 01/03/2005 Document Revised: 11/28/2017 Document Reviewed: 11/28/2017 Elsevier Patient Education  2020 [employer].

## 2018-07-24 ENCOUNTER — Encounter: Payer: Self-pay | Admitting: Medical

## 2018-07-24 ENCOUNTER — Telehealth: Payer: Self-pay

## 2018-07-24 ENCOUNTER — Ambulatory Visit (INDEPENDENT_AMBULATORY_CARE_PROVIDER_SITE_OTHER): Payer: Self-pay | Admitting: Medical

## 2018-07-24 ENCOUNTER — Encounter: Payer: Self-pay | Admitting: *Deleted

## 2018-07-24 VITALS — BP 112/71 | HR 64 | Wt 165.0 lb

## 2018-07-24 DIAGNOSIS — N751 Abscess of Bartholin's gland: Secondary | ICD-10-CM

## 2018-07-24 NOTE — Patient Instructions (Signed)
Quiste de Bartolino Bartholin's Cyst  Un quiste de Bartolino es un saco lleno de lquido que se forma en una glndula de Bartolino. Las glndulas de Bartolino son pequeas glndulas que se encuentran en los pliegues de la piel alrededor del orificio de la vagina (labios de la vulva). Este tipo de quiste forma un bulto o una hinchazn cerca del orificio inferior de la vagina. Si tiene un quiste pequeo y no infectado, es posible que pueda tratarlo en su casa. Si el quiste se infecta, puede causar dolor y es posible que el mdico deba drenarlo. Un quiste de Bartolino infectado se denomina absceso de Bartolino. Siga estas indicaciones en su casa: Medicamentos  Tome los medicamentos de venta libre y los recetados solamente como se lo haya indicado el mdico.  Si le recetaron un antibitico, tmelo como se lo haya indicado el mdico. No deje de tomar el antibitico aunque comience a sentirse mejor. Control del dolor y de la hinchazn  Pruebe con un bao de asiento para disminuir el dolor y la hinchazn. Un bao de asiento es un bao con agua tibia en el cual el agua solo le llega hasta la cadera y debe cubrir las nalgas. Puede tomar baos de asiento algunas veces al da.  Use calor en la zona afectada con la frecuencia que sea necesaria. Use la fuente de calor que el mdico le recomiende, como una compresa de calor hmedo o una almohadilla trmica. ? Coloque una toalla entre la piel y la fuente de calor. ? Aplique calor durante 20 a 30minutos. ? Retire la fuente de calor si la piel se pone de color rojo brillante. Esto es muy importante si no puede sentir el dolor, el calor o el fro. Puede correr un riesgo mayor de sufrir quemaduras. Indicaciones generales  Si el quiste o absceso fue drenado: ? Siga las instrucciones del mdico en lo que respecta al cuidado de la herida. ? Utilice toallas femeninas para absorber el lquido.  No apriete ni haga presin sobre el quiste.  No tenga relaciones  sexuales hasta que el quiste haya desaparecido o la herida del drenaje haya sanado.  Tome estas medidas para ayudar a prevenir que vuelva a formarse un quiste de Bartolino y que se formen otros quistes de Bartolino: ? Tome un bao o una ducha una vez da. Higienice el rea vaginal con agua y un jabn suave cuando se bae. ? Practique sexo seguro para prevenir las ITS (infecciones de transmisin sexual). Hable con el mdico sobre cmo prevenir las ITS y qu formas de mtodos de control de la natalidad (anticonceptivos) pueden ser mejores para usted.  Concurra a todas las visitas de control como se lo haya indicado el mdico. Esto es importante. Comunquese con un mdico si:  Tiene fiebre.  Tiene enrojecimiento, hinchazn o dolor alrededor del quiste.  Tiene lquido, sangre, pus o mal olor que proviene del quiste.  Tiene un quiste que aumenta de tamao o vuelve a formarse. Resumen  Un quiste de Bartolino es un saco lleno de lquido que se forma en una glndula de Bartolino. Estas pequeas glndulas se encuentran en los pliegues de la piel alrededor del orificio de la vagina (labios de la vulva).  Este tipo de quiste forma un bulto o una hinchazn cerca del orificio inferior de la vagina. Un quiste de Bartolino infectado se denomina absceso de Bartolino.  Pruebe con baos de asiento algunas veces al da para ayudar a disminuir el dolor y la hinchazn.  No apriete   ni haga presin [employer]. Esta informacin no tiene Marine scientist el consejo del mdico. Asegrese de hacerle al mdico cualquier pregunta que tenga. Document Released: 02/05/2010 Document Revised: 11/28/2017 Document Reviewed: 11/28/2017 Elsevier Patient Education  2020 [employer].

## 2018-07-24 NOTE — Telephone Encounter (Signed)
Pt called this am stating that her word catheter came out, she just had her cyst drained yesterday, and she stated she's feeling a lot of discomfort.So had her scheduled for Nurse visit at 3:20pm, Pt verbalized understanding.

## 2018-07-24 NOTE — Progress Notes (Signed)
History:  [name] is a 48 y.o. [id number] who presents to clinic today for evaluation of Word catheter. She was seen in the office yesterday and had I&D of Bartholin's gland cyst. She had Word catheter placed.  She is concerned that the catheter is falling out already. She states some continued drainage and mild to moderate pain.   The following portions of the patient's history were reviewed and updated as appropriate: allergies, current medications, family history, past medical history, social history, past surgical history and problem list.  Review of Systems:  Review of Systems  Constitutional: Negative for fever.  Genitourinary:       Neg - vaginal bleeding + discharge       Objective:  Physical Exam BP 112/71   Pulse 64   Wt 165 lb (74.8 kg)   BMI 29.23 kg/m  Physical Exam  Vitals reviewed. Constitutional: She appears well-developed and well-nourished.  Cardiovascular: Normal rate.  Respiratory: Effort normal.  GI: Soft.  Genitourinary:     Neurological: She is alert.  Skin: Skin is warm and dry. No erythema.  Psychiatric: She has a normal mood and affect.    Assessment & Plan:  1. Bartholin's gland abscess - Patient reassured of normal Word catheter placement  - Advised to monitor for signs of re-infection  - Advised to take Ibuprofen PRN for pain  - Patient to follow-up in 3 weeks as scheduled with Dr. Roselie Awkward for removal and re-evaluation   Luvenia Redden, PA-C 07/24/2018 4:28 PM

## 2018-07-26 ENCOUNTER — Telehealth (INDEPENDENT_AMBULATORY_CARE_PROVIDER_SITE_OTHER): Payer: Self-pay

## 2018-07-26 DIAGNOSIS — N751 Abscess of Bartholin's gland: Secondary | ICD-10-CM

## 2018-07-26 NOTE — Telephone Encounter (Signed)
Pt's daughter called and stated her mother had a visit in which her cyst came out.  She stated that her went to the bathroom and it came out.  Is it normal?  Called pt with [employer] # [phone] and asked pt what she was referring to?  Pt reports that something was placed for drainage and the plastic part fell out with some bleeding.  I confirmed with pt if the plastic she is referring to a brownish color.  Pt stated that it fell in the toilet and the color was white.  Then pt stated that she is not sure what colored because she flushed the toilet.  Notified Phill Myron, DO who recommends that the pt keep her appt in 3 weeks however if she notices that the area is no longer draining and the area increases in size please call the office for a sooner appt.  Pt verbalized understanding.

## 2018-07-31 ENCOUNTER — Encounter (HOSPITAL_COMMUNITY): Payer: Self-pay

## 2018-07-31 ENCOUNTER — Other Ambulatory Visit: Payer: Self-pay

## 2018-07-31 ENCOUNTER — Ambulatory Visit (HOSPITAL_COMMUNITY)
Admission: RE | Admit: 2018-07-31 | Discharge: 2018-07-31 | Disposition: A | Discharge status: Home / Self Care | Payer: No Typology Code available for payment source | Source: Ambulatory Visit | Attending: Obstetrics and Gynecology | Admitting: Obstetrics and Gynecology

## 2018-07-31 ENCOUNTER — Ambulatory Visit: Admission: RE | Admit: 2018-07-31 | Payer: No Typology Code available for payment source | Source: Ambulatory Visit

## 2018-07-31 ENCOUNTER — Ambulatory Visit
Admission: RE | Admit: 2018-07-31 | Discharge: 2018-07-31 | Disposition: A | Discharge status: Home / Self Care | Payer: No Typology Code available for payment source | Source: Ambulatory Visit | Attending: Obstetrics and Gynecology | Admitting: Obstetrics and Gynecology

## 2018-07-31 DIAGNOSIS — Z1239 Encounter for other screening for malignant neoplasm of breast: Secondary | ICD-10-CM

## 2018-07-31 DIAGNOSIS — Z853 Personal history of malignant neoplasm of breast: Secondary | ICD-10-CM

## 2018-07-31 NOTE — Progress Notes (Signed)
No complaints today.   Pap Smear: Pap smear not completed today. Last Pap smear was 02/25/2016 at the [facility] and normal. Per patient has no history of an abnormal Pap smear. Last Pap smear result is in EPIC.  Physical exam: Breasts Left breast slightly larger than right breast due to history of right breast lumpectomy 06/16/2015.No skin abnormalities left breast. Scar observed on right upper outer breast from lumpectomy and skin darkened right breast due to history of radiation.No nipple retraction bilateral breasts. No nipple discharge bilateral breasts. No lymphadenopathy. No lumps palpated bilateral breasts. No complaints of pain or tenderness on exam.Referred patient to the [facility] for diagnostic mammogramper recommendation for history of lumpectomy for breast cancer. Appointment scheduled for Tuesday,July 14, 2020at 1440.        Pelvic/Bimanual No Pap smear completed today since last Pap smear was2/08/2016. Pap smear not indicated per BCCCP guidelines.   Smoking History: Patient has never smoked.  Patient Navigation: Patient education provided. Access to services provided for patient through [facility] program. Spanish interpreter provided.   Colorectal Cancer Screening: Patient had a colonoscopy completed 07/04/2017. FIT Test completed 07/13/2018 that was positive. Patient being followed by PCP. No complaints today.   Breast and Cervical Cancer Risk Assessment: Patient has no family history of breast cancer. Patient has a personal history of breast cancer. Patient has a known BRCA genetic mutations. Patient has no history of radiation treatment to the chest before age 82. Patient has no history of cervical dysplasia, immunocompromised, or DES exposure in-utero. Breast cancer risk assessment completed. Risk not calculated due to patients history of the BRCA mutation.   Used Spanish interpreter [employer] from [place].

## 2018-07-31 NOTE — Patient Instructions (Signed)
Explained breast self awareness with Nicole Holloway. Patient did not need a Pap smear today due to last Pap smear was 02/25/2016. Let her know BCCCP will cover Pap smears every 3 years unless has a history of abnormal Pap smears. Referred patient to the [facility] for diagnostic mammogramper recommendation for history of lumpectomy for breast cancer. Appointment scheduled for Tuesday,July 14, 2020at 1440. Patient aware of appointment and will be there. [place] verbalized understanding.  Nicole Holloway, Arvil Chaco, RN 1:11 PM

## 2018-08-07 ENCOUNTER — Encounter (HOSPITAL_COMMUNITY): Payer: Self-pay | Admitting: *Deleted

## 2018-08-23 ENCOUNTER — Ambulatory Visit (INDEPENDENT_AMBULATORY_CARE_PROVIDER_SITE_OTHER): Payer: Self-pay | Admitting: Obstetrics & Gynecology

## 2018-08-23 ENCOUNTER — Other Ambulatory Visit: Payer: Self-pay

## 2018-08-23 ENCOUNTER — Encounter: Payer: Self-pay | Admitting: Obstetrics & Gynecology

## 2018-08-23 VITALS — BP 100/65 | HR 97 | Wt 163.0 lb

## 2018-08-23 DIAGNOSIS — N751 Abscess of Bartholin's gland: Secondary | ICD-10-CM

## 2018-08-23 NOTE — Patient Instructions (Signed)
Quiste de [employer] Bartholin's Cyst  Un quiste de Bartolino es un saco lleno de lquido que se forma en una glndula de [place]. Las glndulas de Bartolino son pequeas glndulas ubicadas en los pliegues de la piel alrededor del orificio de la vagina (labios de la vulva). Estas glndulas secretan un lquido que humedece (lubrica) la parte externa de la vagina durante las relaciones sexuales. Un quiste que no es grande ni est infectado puede no causar problemas ni requerir Clinical research associate. Si el quiste se infecta, se denomina absceso de [place]. Un absceso puede causar sntomas como dolor e hinchazn y es ms probable que requiera tratamiento. Cules son las causas? Esta afeccin puede producirse por una glndula de Bartolino obstruida. Estas glndulas pueden obstruirse debido a la acumulacin natural de lquidos y aceites. La presencia de bacterias dentro del quiste puede causar una infeccin. En muchos de [employer], se desconoce la causa. Qu incrementa el riesgo? Puede tener un riesgo ms alto de Actor un quiste o un absceso de Bartolino si:  Est en edad frtil.  Tiene antecedentes de quistes o abscesos de [place].  Tiene diabetes.  Tiene una ITS (infeccin de transmisin sexual). Cules son los signos o los sntomas? Entre los sntomas, se pueden incluir los siguientes:  Un bulto o una hinchazn en los labios de la vulva, cerca del orificio inferior de la vagina.  Molestias o dolor. Esto puede empeorar al [employer] sexuales o al caminar.  Enrojecimiento, hinchazn y secrecin de lquido en el rea. Estos podran ser signos de un absceso. La gravedad de los sntomas depende del tamao del quiste y si est infectado. La infeccin agrava los sntomas. Cmo se diagnostica? Esta afeccin se puede diagnosticar en funcin de lo siguiente:  Los sntomas y los antecedentes mdicos.  Un examen fsico para detectar hinchazn en el rea vaginal. Es posible que deba  acostarse boca arriba en una camilla con los pies en los soportes para que Hotel manager.  Anlisis de sangre para verificar si hay infecciones.  Extraccin de [place] de lquido del quiste o absceso (biopsia) para Geophysical data processor. Es posible que trabaje con un mdico especializado en [place] (gineclogo) para recibir un diagnstico y Clinical research associate. Cmo se trata? Si el quiste es pequeo, no est infectado y no causa sntomas, tal vez no sea necesario Building services engineer. Estos quistes a menudo desaparecen por s solos, con cuidados en el hogar como baos calientes o compresas tibias. Si tiene un quiste o absceso grande, el tratamiento puede incluir lo siguiente:  Antibiticos.  Un procedimiento para drenar el lquido que se encuentra en el interior del quiste o absceso. Estos procedimientos involucran una incisin en el quiste o absceso para drenar el lquido y luego puede realizarse alguno de los siguientes procedimientos: ? Puede colocarse un pequeo tubo delgado (catter) dentro del quiste o absceso para evitar que se cierre y vuelva a llenarse de lquido (fistulizacin). El catter se extraer en la visita de seguimiento. ? Los bordes de la incisin pueden suturarse con puntos en la piel para que el quiste o absceso se mantenga abierto ([place]). Esto permite que siga drenando y no se vuelva a llenar de lquido. Si tiene quistes o abscesos que vuelven a formarse (recurrentes) y han requerido incisin y Advertising account executive veces, el mdico puede plantearle la posibilidad de Ardelia Mems ciruga para extirpar la glndula de [place]. Siga estas indicaciones en su casa: Medicamentos  [employer] de venta libre y los recetados solamente  como se lo haya indicado el mdico.  Si le recetaron un antibitico, tmelo como se lo haya indicado el mdico. No deje de tomar o usar el antibitico aunque la afeccin mejore. Control del dolor y de la hinchazn  Pruebe  con un bao de asiento para Therapist, occupational y la hinchazn. Un bao de asiento es un bao con agua tibia en el cual el agua solo le llega hasta la cadera y debe cubrir las nalgas. Puede tomar baos de asiento varias veces al [employer].  Aplique calor en la zona afectada con la frecuencia que sea necesaria. Use la fuente de calor que el mdico le recomiende, como una compresa de calor hmedo o una almohadilla trmica. ? Coloque una [employer] piel y la fuente de Freight forwarder. ? Aplique calor durante 20 a 4minutos. ? Retire la fuente de calor si la piel se pone de color rojo brillante. Esto es especialmente importante si no puede sentir dolor, calor o fro. Puede correr un riesgo mayor de sufrir quemaduras. Indicaciones generales  Si se dren el quiste o absceso, siga las indicaciones del mdico acerca del cuidado de la herida. Utilice toallas femeninas cuando lo necesite para absorber cualquier secrecin.  No apriete ni haga presin [employer].  No tenga relaciones sexuales hasta que el quiste haya desaparecido o la herida del drenaje haya sanado.  Tome estas medidas para ayudar a prevenir que vuelva a formarse un quiste de Engineer, manufacturing y que se desarrollen otros quistes de Bartolino: ? Tome un bao o una ducha una [place]. Higienice el rea vaginal con agua y un jabn suave cuando se bae. ? Practique el sexo seguro para prevenir las ITS. Hable con el mdico sobre cmo prevenir las ITS y qu formas de mtodos de control de la natalidad (anticonceptivos) pueden ser mejores para usted.  Concurra a todas las visitas de control como se lo haya indicado el mdico. Esto es importante. Comunquese con un mdico si:  Tiene fiebre.  Presenta enrojecimiento, hinchazn o dolor alrededor del quiste.  Tiene lquido, sangre, pus o mal olor que proviene del [place].  Tiene un quiste que [place] de tamao o vuelve a formarse. Resumen  Un quiste de Bartolino es un saco lleno de lquido que se forma en una  glndula de [place]. Estas glndulas se encuentran en los pliegues de la piel alrededor del orificio de la vagina (labios de la vulva).  Si el quiste es pequeo, no est infectado y no causa sntomas, tal vez no sea necesario Building services engineer. Estos quistes a menudo desaparecen por s solos, con cuidados en el hogar como baos calientes o compresas tibias.  Si tiene un quiste o absceso grande, es posible que el mdico realice un procedimiento para Musician lquido.  Si tiene quistes o abscesos que vuelven a formarse (recurrentes) y han requerido incisin y Advertising account executive veces, el mdico puede plantearle la posibilidad de Ardelia Mems ciruga para extirpar la glndula de [place]. Esta informacin no tiene Marine scientist el consejo del mdico. Asegrese de hacerle al mdico cualquier pregunta que tenga. Document Released: 01/03/2005 Document Revised: 11/28/2017 Document Reviewed: 11/28/2017 Elsevier Patient Education  2020 [employer].

## 2018-08-23 NOTE — Progress Notes (Signed)
Cc: catheter fell out   48 y.o. [id number] No LMP recorded. Patient is perimenopausal. The Word catheter fell out 7/9, and she still has symptoms at the site  Recurrent enlarged abscess at right posterior vulva noted. She gave consent for I&D Bartholin Cyst I&D and Word Catheter Placement Enlarged abscess palpated in front of the hymenal ring around 5 or 7 o' clock.  Written informed consent was obtained.  Discussed complications and possible outcomes of procedure including recurrence of cyst, scarring leading to infection, bleeding, dyspareunia, distortion of anatomy.  Patient was examined in the dorsal lithotomy position and mass was identified.  The area was prepped with Iodine and draped in a sterile manner. 1% Lidocaine (3 ml) was then used to infiltrate area on top of the cyst, behind the hymenal ring.  A 5 mm incision was made using a sterile scapel. Upon palpation of the mass, a moderate amount of bloody purulent drainage was expressed through the incision. A hemostat was used to break up loculations, which resulted in expression of more bloody purulent drainage.  Samples of the drainage were sent for cultures. The open cyst was then copiously irrigated with normal saline, and a Word catheter was placed. 3 ml of sterile water was used to inflate the catheter balloon.  The end of the catheter was tucked into the vagina.  Patient tolerated the procedure well, reported feeling " a lot better." - Recommended Sitz baths bid and Motrin and Percocet was given  prn pain.   She was told to call to be examined if she experiences increasing swelling, pain, vaginal discharge, or fever.  - She was instructed to wear a peripad to absorb discharge, and to maintain pelvic rest while the Word catheter is in place.  -The catheter will be left in place for at least two to four weeks to promote formation of an epithelialized tract for permanent drainage of glandular secretions.  - She will need an appointment in GYN  clinician 4-6 weeks from now for removal of word catheter  Woodroe Mode, MD 08/23/2018

## 2018-08-27 ENCOUNTER — Encounter: Payer: Self-pay | Admitting: *Deleted

## 2018-09-03 ENCOUNTER — Other Ambulatory Visit: Payer: Self-pay

## 2018-09-03 ENCOUNTER — Encounter: Payer: Self-pay | Admitting: Family Medicine

## 2018-09-03 ENCOUNTER — Ambulatory Visit (INDEPENDENT_AMBULATORY_CARE_PROVIDER_SITE_OTHER): Payer: Self-pay | Admitting: Family Medicine

## 2018-09-03 ENCOUNTER — Telehealth (INDEPENDENT_AMBULATORY_CARE_PROVIDER_SITE_OTHER): Payer: Self-pay | Admitting: General Practice

## 2018-09-03 VITALS — BP 110/74 | HR 90 | Wt 163.0 lb

## 2018-09-03 DIAGNOSIS — N751 Abscess of Bartholin's gland: Secondary | ICD-10-CM

## 2018-09-03 MED ORDER — SULFAMETHOXAZOLE-TRIMETHOPRIM 800-160 MG PO TABS: 1.0000 | ORAL_TABLET | Freq: Two times a day (BID) | ORAL | 0 refills | Status: AC

## 2018-09-03 MED ORDER — FLUCONAZOLE 150 MG PO TABS: 150.0000 mg | ORAL_TABLET | ORAL | 0 refills | Status: AC

## 2018-09-03 NOTE — Telephone Encounter (Signed)
Patient called into front office reporting feeling a lump on the inside that has become painful. Patient states the drainage coming from it also has an odor. Asked the patient if the catheter fell out and she states she cannot tell but doesn't think so. Patient had word cath placed for a second time on 8/6 and initially reported relief but has been having pain since Friday. Offered appt today at 1:55pm in the office for evaluation. Patient verbalized understanding & states she will be here then. [place] interpreter [phone] use for today's encounter.

## 2018-09-03 NOTE — Progress Notes (Signed)
   Subjective:    Patient ID: Nicole Holloway, female    DOB: 04-17-1970, 48 y.o.   MRN: [id number]  HPI Patient seen for increasing pain. Had bartholin cyst drained 10 days ago. Was fine until Friday evening when she started having increased pain. Some pain in medial thigh as well.    Review of Systems     Objective:   Physical Exam Constitutional:      Appearance: Normal appearance.  Abdominal:     General: Abdomen is flat.     Palpations: Abdomen is soft.  Genitourinary:   Neurological:     Mental Status: She is alert.         Assessment & Plan:  1. Bartholin's gland abscess Bactrim DS BID x10 days. Follow up in 4 weeks. Call back if worsening.

## 2018-09-28 ENCOUNTER — Telehealth: Payer: Self-pay | Admitting: Obstetrics & Gynecology

## 2018-09-28 NOTE — Telephone Encounter (Signed)
Called the patient to inform of the upcoming appointment with the in office interrupter [place].  Informed the patient of the upcoming appointment and also completed the covid screening. The patient answered no to all questions. Informed of wearing a face mask and no children or visitors due to covid restrictions. The patient verbalized understanding.

## 2018-10-01 ENCOUNTER — Encounter: Payer: Self-pay | Admitting: Obstetrics & Gynecology

## 2018-10-01 ENCOUNTER — Ambulatory Visit (INDEPENDENT_AMBULATORY_CARE_PROVIDER_SITE_OTHER): Payer: Self-pay | Admitting: Obstetrics & Gynecology

## 2018-10-01 ENCOUNTER — Other Ambulatory Visit: Payer: Self-pay

## 2018-10-01 VITALS — BP 118/54 | HR 81 | Wt 164.8 lb

## 2018-10-01 DIAGNOSIS — N751 Abscess of Bartholin's gland: Secondary | ICD-10-CM

## 2018-10-01 NOTE — Patient Instructions (Signed)
Quiste de Bartolino Bartholin's Cyst  Un quiste de Bartolino es un saco lleno de lquido que se forma en una glndula de Bartolino. Las glndulas de Bartolino son pequeas glndulas que se encuentran en los pliegues de la piel alrededor del orificio de la vagina (labios de la vulva). Este tipo de quiste forma un bulto o una hinchazn cerca del orificio inferior de la vagina. Si tiene un quiste pequeo y no infectado, es posible que pueda tratarlo en su casa. Si el quiste se infecta, puede causar dolor y es posible que el mdico deba drenarlo. Un quiste de Bartolino infectado se denomina absceso de Bartolino. Siga estas indicaciones en su casa: Medicamentos  Tome los medicamentos de venta libre y los recetados solamente como se lo haya indicado el mdico.  Si le recetaron un antibitico, tmelo como se lo haya indicado el mdico. No deje de tomar el antibitico aunque comience a sentirse mejor. Control del dolor y de la hinchazn  Pruebe con un bao de asiento para disminuir el dolor y la hinchazn. Un bao de asiento es un bao con agua tibia en el cual el agua solo le llega hasta la cadera y debe cubrir las nalgas. Puede tomar baos de asiento algunas veces al da.  Use calor en la zona afectada con la frecuencia que sea necesaria. Use la fuente de calor que el mdico le recomiende, como una compresa de calor hmedo o una almohadilla trmica. ? Coloque una toalla entre la piel y la fuente de calor. ? Aplique calor durante 20 a 30minutos. ? Retire la fuente de calor si la piel se pone de color rojo brillante. Esto es muy importante si no puede sentir el dolor, el calor o el fro. Puede correr un riesgo mayor de sufrir quemaduras. Indicaciones generales  Si el quiste o absceso fue drenado: ? Siga las instrucciones del mdico en lo que respecta al cuidado de la herida. ? Utilice toallas femeninas para absorber el lquido.  No apriete ni haga presin sobre el quiste.  No tenga relaciones  sexuales hasta que el quiste haya desaparecido o la herida del drenaje haya sanado.  Tome estas medidas para ayudar a prevenir que vuelva a formarse un quiste de Bartolino y que se formen otros quistes de Bartolino: ? Tome un bao o una ducha una vez da. Higienice el rea vaginal con agua y un jabn suave cuando se bae. ? Practique sexo seguro para prevenir las ITS (infecciones de transmisin sexual). Hable con el mdico sobre cmo prevenir las ITS y qu formas de mtodos de control de la natalidad (anticonceptivos) pueden ser mejores para usted.  Concurra a todas las visitas de control como se lo haya indicado el mdico. Esto es importante. Comunquese con un mdico si:  Tiene fiebre.  Tiene enrojecimiento, hinchazn o dolor alrededor del quiste.  Tiene lquido, sangre, pus o mal olor que proviene del quiste.  Tiene un quiste que aumenta de tamao o vuelve a formarse. Resumen  Un quiste de Bartolino es un saco lleno de lquido que se forma en una glndula de Bartolino. Estas pequeas glndulas se encuentran en los pliegues de la piel alrededor del orificio de la vagina (labios de la vulva).  Este tipo de quiste forma un bulto o una hinchazn cerca del orificio inferior de la vagina. Un quiste de Bartolino infectado se denomina absceso de Bartolino.  Pruebe con baos de asiento algunas veces al da para ayudar a disminuir el dolor y la hinchazn.  No apriete   ni haga presin sobre el quiste. Esta informacin no tiene como fin reemplazar el consejo del mdico. Asegrese de hacerle al mdico cualquier pregunta que tenga. Document Released: 02/05/2010 Document Revised: 11/28/2017 Document Reviewed: 11/28/2017 Elsevier Patient Education  2020 Elsevier Inc.  

## 2018-10-01 NOTE — Progress Notes (Signed)
Patient had I&D of right bartholin's gland abscess and Word catheter has been in place since 8/6. Her antibiotic course is complete and there is no pain  The drainage site is not tender, Word catheter was drained and removed. Imp: s/p Bartholin's gland I&D  Plan: report if her symptoms recur   Woodroe Mode, MD 10/01/2018

## 2018-10-10 MED FILL — TAMOXIFEN 20 MG TABLET: 20 | 90 days supply | Qty: 90 | Fill #2

## 2018-12-06 ENCOUNTER — Telehealth: Payer: Self-pay | Admitting: Internal Medicine

## 2018-12-06 ENCOUNTER — Other Ambulatory Visit: Payer: Self-pay

## 2018-12-06 DIAGNOSIS — R300 Dysuria: Secondary | ICD-10-CM

## 2018-12-06 LAB — POCT URINALYSIS DIPSTICK
Bilirubin, UA: NEGATIVE
Glucose, UA: NEGATIVE
Ketones, UA: NEGATIVE
Nitrite, UA: NEGATIVE
Protein, UA: POSITIVE — AB
Spec Grav, UA: <=1.005 — AB (ref 1.010–1.025)
Urobilinogen, UA: 0.2 E.U./dL
pH, UA: 7 (ref 5.0–8.0)

## 2018-12-06 MED ORDER — CIPROFLOXACIN HCL 500 MG PO TABS: 500.0000 mg | ORAL_TABLET | Freq: Two times a day (BID) | ORAL | 0 refills | Status: DC

## 2018-12-06 NOTE — Telephone Encounter (Signed)
Spoke with patient to inform to go to the Pharmacy and pick up Cipro 500 mg and take  1 pill twice a day x three days until  urine culture results come back.  Patient verbalized understanding.

## 2018-12-10 LAB — URINE CULTURE

## 2018-12-17 ENCOUNTER — Telehealth: Payer: Self-pay | Admitting: Internal Medicine

## 2018-12-17 NOTE — Telephone Encounter (Signed)
Patient called requesting to be seen due to feel bloated and with stomach ache since yesterday. After went through with  the [place] screening questions  found out patient is  feeling fatigue, with sore throat and cough since Saturday. Patient advised has to go to get test it and was provided with information on where can go and get it done. Patient stated will go today and get it done at [facility].   Information shared with Cherice and Teresita as well.

## 2018-12-18 ENCOUNTER — Other Ambulatory Visit: Payer: Self-pay

## 2018-12-18 DIAGNOSIS — Z20822 Contact with and (suspected) exposure to covid-19: Secondary | ICD-10-CM

## 2018-12-18 NOTE — Telephone Encounter (Signed)
Patient information was given to [facility] lead on 12/17/2018 and patient was assigned to [facility]

## 2018-12-20 LAB — NOVEL CORONAVIRUS, NAA: SARS-CoV-2, NAA: NOT DETECTED

## 2018-12-24 ENCOUNTER — Emergency Department (HOSPITAL_COMMUNITY): Payer: Self-pay

## 2018-12-24 ENCOUNTER — Other Ambulatory Visit: Payer: Self-pay

## 2018-12-24 ENCOUNTER — Encounter (HOSPITAL_COMMUNITY): Payer: Self-pay | Admitting: Emergency Medicine

## 2018-12-24 ENCOUNTER — Telehealth: Payer: Self-pay | Admitting: Internal Medicine

## 2018-12-24 ENCOUNTER — Emergency Department (HOSPITAL_COMMUNITY)
Admission: EM | Admit: 2018-12-24 | Discharge: 2018-12-25 | Disposition: A | Discharge status: Home / Self Care | Payer: Self-pay | Attending: Emergency Medicine | Admitting: Emergency Medicine

## 2018-12-24 DIAGNOSIS — M791 Myalgia, unspecified site: Secondary | ICD-10-CM | POA: Insufficient documentation

## 2018-12-24 DIAGNOSIS — R05 Cough: Secondary | ICD-10-CM | POA: Insufficient documentation

## 2018-12-24 DIAGNOSIS — R059 Cough, unspecified: Secondary | ICD-10-CM

## 2018-12-24 DIAGNOSIS — Z79899 Other long term (current) drug therapy: Secondary | ICD-10-CM | POA: Insufficient documentation

## 2018-12-24 DIAGNOSIS — Z853 Personal history of malignant neoplasm of breast: Secondary | ICD-10-CM | POA: Insufficient documentation

## 2018-12-24 LAB — BASIC METABOLIC PANEL
Anion gap: 7 (ref 5–15)
BUN: 12 mg/dL (ref 6–20)
CO2: 23 mmol/L (ref 22–32)
Calcium: 8.5 mg/dL — ABNORMAL LOW (ref 8.9–10.3)
Chloride: 108 mmol/L (ref 98–111)
Creatinine, Ser: 1.03 mg/dL — ABNORMAL HIGH (ref 0.44–1.00)
GFR calc Af Amer: >60 mL/min (ref >60)
GFR calc non Af Amer: >60 mL/min (ref >60)
Glucose, Bld: 116 mg/dL — ABNORMAL HIGH (ref 70–99)
Potassium: 3.4 mmol/L — ABNORMAL LOW (ref 3.5–5.1)
Sodium: 138 mmol/L (ref 135–145)

## 2018-12-24 LAB — CBC
HCT: 37.5 % (ref 36.0–46.0)
Hemoglobin: 12.9 g/dL (ref 12.0–15.0)
MCH: 31.1 pg (ref 26.0–34.0)
MCHC: 34.4 g/dL (ref 30.0–36.0)
MCV: 90.4 fL (ref 80.0–100.0)
Platelets: 194 10*3/uL (ref 150–400)
RBC: 4.15 MIL/uL (ref 3.87–5.11)
RDW: 12.2 % (ref 11.5–15.5)
WBC: 7.6 10*3/uL (ref 4.0–10.5)
nRBC: 0 % (ref 0.0–0.2)

## 2018-12-24 LAB — I-STAT BETA HCG BLOOD, ED (MC, WL, AP ONLY): I-stat hCG, quantitative: <5 m[IU]/mL (ref <5)

## 2018-12-24 LAB — TROPONIN I (HIGH SENSITIVITY): Troponin I (High Sensitivity): <2 ng/L (ref <18)

## 2018-12-24 MED ORDER — DIPHENHYDRAMINE HCL 50 MG/ML IJ SOLN
25.0000 mg | Freq: Once | INTRAMUSCULAR | Status: AC
  Administered 2018-12-25: 01:00:00 25 mg via INTRAVENOUS
  Filled 2018-12-24: qty 1

## 2018-12-24 MED ORDER — SODIUM CHLORIDE 0.9 % IV BOLUS
1000.0000 mL | Freq: Once | INTRAVENOUS | Status: AC
  Administered 2018-12-25: 01:00:00 1000 mL via INTRAVENOUS

## 2018-12-24 MED ORDER — SODIUM CHLORIDE 0.9% FLUSH
3.0000 mL | Freq: Once | INTRAVENOUS | Status: AC
  Administered 2018-12-25: 01:00:00 3 mL via INTRAVENOUS

## 2018-12-24 MED ORDER — KETOROLAC TROMETHAMINE 30 MG/ML IJ SOLN
30.0000 mg | Freq: Once | INTRAMUSCULAR | Status: AC
  Administered 2018-12-25: 01:00:00 30 mg via INTRAVENOUS
  Filled 2018-12-24: qty 1

## 2018-12-24 MED ORDER — METOCLOPRAMIDE HCL 5 MG/ML IJ SOLN
10.0000 mg | Freq: Once | INTRAMUSCULAR | Status: AC
  Administered 2018-12-25: 01:00:00 10 mg via INTRAVENOUS
  Filled 2018-12-24: qty 2

## 2018-12-24 NOTE — ED Triage Notes (Signed)
Pt reports neck pain, chest pain, cough and chills, denies fevers. Denies recent sick contacts.

## 2018-12-24 NOTE — Telephone Encounter (Signed)
Patient called requesting appointment to be seen today due to be having neck and back pain since Saturday. Patient informed our schedule is full for today but can be wait listed until appointments for Wed, Thur and Frid are confirmed; patient informed as well lately we are having many cancellations. Patient stated has been taking ibuprofen and is not really helping and will go to ED to be seen. I advised patient to allow me at least with two hours to confirm appointments because she can end up with a high bill going to ED. Patient stated she knows about that but she will like to go anyways.

## 2018-12-24 NOTE — ED Provider Notes (Signed)
[facility] EMERGENCY DEPARTMENT Provider Note   CSN: [id number] Arrival date & time: 12/24/18  1415     History   Chief Complaint Chief Complaint  Patient presents with  . Neck Pain  . Chest Pain    HPI Nicole Holloway is a 48 y.o. female.     Patient presents to the emergency department with a chief complaint of headache.  She reports associated neck pain, back pain, and cough.  She denies any fevers.  States she may have had some chills.  She states she was tested for coronavirus last week and it was negative.  She has had the symptoms for the past week.  She states that her back and neck hurt worse when she is coughing.  She states that she did have some sinus congestion and runny nose.  She states that this improved with allergy medication.  The history is provided by the patient. The history is limited by a language barrier. A language interpreter was used (Spanish interpreter used).    Past Medical History:  Diagnosis Date  . Abnormal mammogram with microcalcification 07/03/2014   Left Breast calcifications  . Breast cancer ([place])   . Breast cancer ([place]) 04/2015   surgery, radiation, Tamoxifen  . Constipation   . Elevated liver enzymes 06/09/2014   AST resolved, ALT almost normal on recheck 07/2014, Abdominal Ultrasound 09/05/2014 with [facility] showed Hepatic steatosis  . External hemorrhoids   . Hepatic steatosis 09/05/2014   With elevated liver enzymes  . History of radiation therapy 08/03/15- 08/28/15   Right Breast  . Hyperglycemia 06/09/2014   101 fasting;  repeat 95  . Hyperlipidemia 06/04/2014  . Obesity 12/01/2016  . Seasonal allergies   . Varicose veins of both lower extremities     Patient Active Problem List   Diagnosis Date Noted  . Screening breast examination 07/31/2018  . Bartholin's gland abscess 07/23/2018  . Rectal bleeding 06/15/2017  . Obesity 12/01/2016  . Eczema 11/20/2015  . Keratosis pilaris 11/20/2015  . Genetic  testing 06/12/2015  . Breast cancer of upper-outer quadrant of right female breast ([place]) 05/20/2015  . Elevated liver enzymes 03/16/2015  . Constipation   . External hemorrhoids   . Varicose veins of both lower extremities   . Seasonal allergies   . Hepatic steatosis 09/05/2014  . Hyperlipidemia 06/04/2014  . DUB (dysfunctional uterine bleeding) 11/18/2013    Past Surgical History:  Procedure Laterality Date  . BREAST BIOPSY Right 05/14/2015   U/S Core- Malignant  . BREAST BIOPSY Left 05/14/2015   Stereo- Benign  . BREAST LUMPECTOMY Right 06/16/2015  . BREAST LUMPECTOMY WITH RADIOACTIVE SEED AND SENTINEL LYMPH NODE BIOPSY Right 06/16/2015   Procedure: BREAST LUMPECTOMY WITH RADIOACTIVE SEED AND SENTINEL LYMPH NODE BIOPSY;  Surgeon: Erroll Luna, MD;  Location: [facility];  Service: General;  Laterality: Right;  . CESAREAN SECTION  1998     OB History    Gravida  2   Para  2   Term  2   Preterm      AB      Living  2     SAB      TAB      Ectopic      Multiple      Live Births               Home Medications    Prior to Admission medications   Medication Sig Start Date End Date Taking? Authorizing Provider  amoxicillin (AMOXIL) 500 MG tablet Take 500 mg by mouth 2 (two) times daily.    [provider]  ciprofloxacin (CIPRO) 500 MG tablet Take 1 tablet (500 mg total) by mouth 2 (two) times daily. 12/06/18   Mack Hook, MD  fexofenadine (ALLEGRA) 60 MG tablet Take 60 mg by mouth 2 (two) times daily.    [provider]  tamoxifen (NOLVADEX) 20 MG tablet Take 1 tablet (20 mg total) by mouth daily. Patient not taking: Reported on 10/01/2018 02/15/18   Nicholas Lose, MD    Family History Family History  Problem Relation Age of Onset  . Diabetes Father   . Other Father        prostate issues  . Kidney failure Father 76       started dialysis march 2nd  . Heart disease Father        Heart failure was cause of  death.  . Other Mother        osteoarthritis, knee replacement  . Mental illness Son 57       Bipolar vs Schizophrenia  . Other Maternal Aunt        hx benign breast lump  . Kidney failure Maternal Grandmother        d. 60s; diabetes  . Diabetes Maternal Grandmother     Social History Social History   Tobacco Use  . Smoking status: Never Smoker  . Smokeless tobacco: Never Used  Substance Use Topics  . Alcohol use: No    Frequency: Never    Comment: Rare  . Drug use: No     Allergies   Patient has no known allergies.   Review of Systems Review of Systems  All other systems reviewed and are negative.    Physical Exam Updated Vital Signs BP 104/69   Pulse 94   Temp 99.1 F (37.3 C) (Oral)   Resp 16   Ht 5\' 1"  (1.549 m)   Wt 72.6 kg   LMP 11/23/2018   SpO2 100%   BMI 30.23 kg/m   Physical Exam Vitals signs and nursing note reviewed.  Constitutional:      General: She is not in acute distress.    Appearance: She is well-developed.  HENT:     Head: Normocephalic and atraumatic.  Eyes:     Conjunctiva/sclera: Conjunctivae normal.  Neck:     Musculoskeletal: Neck supple.     Comments: No difficulty with neck flexion Cardiovascular:     Rate and Rhythm: Normal rate and regular rhythm.     Heart sounds: No murmur.  Pulmonary:     Effort: Pulmonary effort is normal. No respiratory distress.     Breath sounds: Normal breath sounds.  Abdominal:     Palpations: Abdomen is soft.     Tenderness: There is no abdominal tenderness.  Musculoskeletal:     Comments: Cervical paraspinal muscle tenderness, there is also tenderness of the upper traps, lumbar paraspinals, no bony tenderness or step-off  Skin:    General: Skin is warm and dry.  Neurological:     Mental Status: She is alert and oriented to person, place, and time.     Comments: CN 3-12 intact Speech is clear No ataxia  Psychiatric:        Mood and Affect: Mood normal.        Behavior: Behavior  normal.      ED Treatments / Results  Labs (all labs ordered are listed, but only abnormal results are displayed) Labs  Reviewed  BASIC METABOLIC PANEL - Abnormal; Notable for the following components:      Result Value   Potassium 3.4 (*)    Glucose, Bld 116 (*)    Creatinine, Ser 1.03 (*)    Calcium 8.5 (*)    All other components within normal limits  CBC  I-STAT BETA HCG BLOOD, ED (MC, WL, AP ONLY)  TROPONIN I (HIGH SENSITIVITY)  TROPONIN I (HIGH SENSITIVITY)    EKG None  Radiology Dg Chest 2 View  Result Date: 12/24/2018 CLINICAL DATA:  Neck pain radiating into chest since yesterday. EXAM: CHEST - 2 VIEW COMPARISON:  Two-view chest x-ray 03/21/2009 FINDINGS: Heart size is normal. Lung volumes are low. Minimal airspace opacities are present at the lung bases bilaterally. IMPRESSION: 1. Low lung volumes. 2. Minimal bibasilar airspace disease likely reflects atelectasis. Infection is considered less likely. Electronically Signed   By: San Morelle M.D.   On: 12/24/2018 16:24    Procedures Procedures (including critical care time)  Medications Ordered in ED Medications  sodium chloride flush (NS) 0.9 % injection 3 mL (has no administration in time range)     Initial Impression / Assessment and Plan / ED Course  I have reviewed the triage vital signs and the nursing notes.  Pertinent labs & imaging results that were available during my care of the patient were reviewed by me and considered in my medical decision making (see chart for details).        Patient with multiple complaints.  She has been complaining of neck and back pain for the past week.  She states that her symptoms are worsened with coughing.  She has also had runny nose and nasal congestion.  She reports improvement with allergy medication.  She was tested for coronavirus, and had a negative test.  She denies any fevers at home.  I believe that her symptoms are likely related to allergies.   Suspect that her postnasal drip is contributing to her cough, and her cough is contributing to her sore muscles.  I doubt coronavirus based on her recent negative testing.  Her chest x-ray is less suspicious for infectious etiology.  She is nontoxic and well-appearing.  I will treat her with a headache cocktail, as I believe that her headache is likely tension headache from her tight muscles.  She is afebrile, does not have any meningismus.  Onset of the symptoms was 1 week ago, I think that meningitis is very unlikely.  Feeling improved.  DC to home.  Final Clinical Impressions(s) / ED Diagnoses   Final diagnoses:  Cough  Myalgia    ED Discharge Orders    None       Montine Circle, PA-C 12/25/18 0150    Carmin Muskrat, MD 12/27/18 [phone]

## 2018-12-25 LAB — TROPONIN I (HIGH SENSITIVITY): Troponin I (High Sensitivity): <2 ng/L (ref <18)

## 2018-12-25 MED ORDER — CYCLOBENZAPRINE HCL 10 MG PO TABS: 10.0000 mg | ORAL_TABLET | Freq: Two times a day (BID) | ORAL | 0 refills | Status: DC | PRN

## 2018-12-25 MED ORDER — BENZONATATE 100 MG PO CAPS: 200.0000 mg | ORAL_CAPSULE | Freq: Two times a day (BID) | ORAL | 0 refills | Status: DC | PRN

## 2018-12-25 NOTE — ED Notes (Signed)
RN attempted IV x2. Unsuccessful.

## 2018-12-25 NOTE — ED Notes (Signed)
Discharge instructions discussed with pt. Pt verbalized understanding. Pt stable and ambulatory. No signature pad available. 

## 2019-01-09 MED FILL — TAMOXIFEN 20 MG TABLET: 20 | 90 days supply | Qty: 90 | Fill #3

## 2019-02-17 NOTE — Progress Notes (Signed)
Patient Care Team: Mack Hook, MD as PCP - General (Internal Medicine)  DIAGNOSIS:    ICD-10-CM   1. Malignant neoplasm of upper-outer quadrant of right breast in female, estrogen receptor positive ([place])  C50.411    Z17.0     SUMMARY OF ONCOLOGIC HISTORY: Oncology History  Breast cancer of upper-outer quadrant of right female breast ([place])  05/14/2015 Initial Diagnosis   Right breast biopsy 10:00: IDC with DCIS, grade 1, ER 90%, PR 80%, Ki-67 3%, HER-2 negative ratio 0.93; right breast distortion on mammogram at 10:00 6 x 4 x 4 mm ultrasound axilla negative, T1b N0 stage IA clinical stage   05/28/2015 Genetic Testing   Genetic testing did not identify a deleterious mutation.  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 Genes tested include: APC, ATM, BARD1, BMPR1A, BRCA1, BRCA2, BRIP1, CHD1, CDK4, CDKN2A, CHEK2, EPCAM (large rearrangement only), GREM1, MLH1, MSH2, MSH6, MUTYH, NBN, PALB2, PMS2, POLD1, POLE, PTEN, RAD51C, RAD51D, SMAD4, STK11, and TP53   06/08/2015 Procedure   Genetic testing: VUS on STK11 gene (considered negative). Genes analyzed:  APC, ATM, BARD1, BMPR1A, BRCA1, BRCA2, BRIP1, CHD1, CDK4, CDKN2A, CHEK2, EPCAM (large rearrangement only), GREM1, MLH1, MSH2, MSH6, MUTYH, NBN, PALB2, PMS2, POLD1, POLE, PTEN, RAD51C, RAD51D, SMAD4, STK11, and TP53   06/16/2015 Surgery   Right lumpectomy ([facility]): IDC with DCIS, 2.2 cm, 0/3 sentinel nodes negative, T2 N0 stage II a, ER 90%, PR 80%, Ki-67 3%, HER-2 negative ratio 0.93 , Oncotype DX score 10, 7% ROR   06/16/2015 Oncotype testing   Recurrence score: 10; ROR 7% (low-risk)    08/03/2015 - 08/28/2015 Radiation Therapy   Adjuvant radiation therapy Isidore Moos). The Right breast was treated to 40.05 Gy in 15 fractions at 2.67 Gy per fraction. Right breast was boosted to 10 Gy in 5 fractions at 2 Gy per fraction.   09/28/2015 -  Anti-estrogen  oral therapy   Tamoxifen 20 mg daily     CHIEF COMPLIANT: Follow-up of right breast cancer on tamoxifen  INTERVAL HISTORY: Nicole Holloway is a 49 y.o. with above-mentioned history of right breast cancer treated with lumpectomy, radiation, and who is currently on oral antiestrogen therapy with tamoxifen. Mammogram from 07/31/18 showed no evidence of malignancy bilaterally. She presents to the clinic today for annual follow-up.  Patient is complaining of muscle aches and pains especially worse in the morning.  She also has moderate hot flashes. She is planning to move to [place] in June or July this year.  She will be moving to [place].  ALLERGIES:  has No Known Allergies.  MEDICATIONS:  Current Outpatient Medications  Medication Sig Dispense Refill  . amoxicillin (AMOXIL) 500 MG tablet Take 500 mg by mouth 2 (two) times daily.    . benzonatate (TESSALON) 100 MG capsule Take 2 capsules (200 mg total) by mouth 2 (two) times daily as needed for cough. 20 capsule 0  . ciprofloxacin (CIPRO) 500 MG tablet Take 1 tablet (500 mg total) by mouth 2 (two) times daily. 6 tablet 0  . cyclobenzaprine (FLEXERIL) 10 MG tablet Take 1 tablet (10 mg total) by mouth 2 (two) times daily as needed for muscle spasms. 20 tablet 0  . fexofenadine (ALLEGRA) 60 MG tablet Take 60 mg by mouth 2 (two) times daily.    . tamoxifen (NOLVADEX) 20 MG tablet Take 1 tablet (20 mg total) by mouth daily. (Patient not taking: Reported on 10/01/2018) 90 tablet 3  No current facility-administered medications for this visit.    PHYSICAL EXAMINATION: ECOG PERFORMANCE STATUS: 1 - Symptomatic but completely ambulatory  Vitals:   02/18/19 0915  BP: (!) 111/55  Pulse: 78  Resp: 18  Temp: 98.1 F (36.7 C)  SpO2: 100%   Filed Weights   02/18/19 0915  Weight: 166 lb 12.8 oz (75.7 kg)    BREAST: No palpable masses or nodules in either right or left breasts. No palpable axillary supraclavicular or infraclavicular adenopathy  no breast tenderness or nipple discharge. (exam performed in the presence of a chaperone)  LABORATORY DATA:  I have reviewed the data as listed CMP Latest Ref Rng & Units 12/24/2018 07/02/2018 07/11/2017  Glucose 70 - 99 mg/dL 116(H) 80 -  BUN 6 - 20 mg/dL 12 11 -  Creatinine 0.44 - 1.00 mg/dL 1.03(H) 0.69 -  Sodium 135 - 145 mmol/L 138 141 -  Potassium 3.5 - 5.1 mmol/L 3.4(L) 4.0 -  Chloride 98 - 111 mmol/L 108 106 -  CO2 22 - 32 mmol/L 23 23 -  Calcium 8.9 - 10.3 mg/dL 8.5(L) 8.7 -  Total Protein 6.0 - 8.5 g/dL - 6.7 6.6  Total Bilirubin 0.0 - 1.2 mg/dL - 0.3 0.3  Alkaline Phos 39 - 117 IU/L - 75 52  AST 0 - 40 IU/L - 36 13  ALT 0 - 32 IU/L - 63(H) 20    Lab Results  Component Value Date   WBC 7.6 12/24/2018   HGB 12.9 12/24/2018   HCT 37.5 12/24/2018   MCV 90.4 12/24/2018   PLT 194 12/24/2018   NEUTROABS 2.9 07/02/2018    ASSESSMENT & PLAN:  Breast cancer of upper-outer quadrant of right female breast ([place]) Right lumpectomy 06/16/2015: IDC with DCIS, 2.2 cm, 0/3 sentinel nodes negative, T2 N0 stage II a, ER 90%, PR 80%, Ki-67 3%, HER-2 negative ratio 0.93  Oncotype DX score 10, 7% risk of recurrence, low risk  Current treatment: 1. Adjuvant radiation therapy started 08/03/2015 followed by 2. Adjuvant antiestrogen therapy with tamoxifen 20 mg dailystarted 09/28/2015  Tamoxifen Toxicities: Hot flashes and myalgias: I instructed her to take tamoxifen in the morning and for the myalgias she will take over-the-counter magnesium supplementation.  Breast cancer surveillance: 1.  Breast exam 02/18/2019: Benign 2.  Mammogram 07/31/2018: No evidence of malignancy breast density category C Patient may go to [place] in June July of this year and if she does that we can transfer her records to the next doctors in [place].   Return to clinic on an as-needed basis    No orders of the defined types were placed in this encounter.  The patient has a good understanding of the  overall plan. she agrees with it. she will call with any problems that may develop before the next visit here.  Total time spent: 20 mins including face to face time and time spent for planning, charting and coordination of care  Nicholas Lose, MD 02/18/2019  I, Cloyde Reams Dorshimer, am acting as scribe for Dr. Nicholas Lose.  I have reviewed the above documentation for accuracy and completeness, and I agree with the above.

## 2019-02-18 ENCOUNTER — Other Ambulatory Visit: Payer: Self-pay

## 2019-02-18 ENCOUNTER — Inpatient Hospital Stay: Payer: Self-pay | Attending: Hematology and Oncology | Admitting: Hematology and Oncology

## 2019-02-18 DIAGNOSIS — Z79810 Long term (current) use of selective estrogen receptor modulators (SERMs): Secondary | ICD-10-CM | POA: Insufficient documentation

## 2019-02-18 DIAGNOSIS — Z170 Estrogen receptor positive status [ER+]: Secondary | ICD-10-CM | POA: Insufficient documentation

## 2019-02-18 DIAGNOSIS — N951 Menopausal and female climacteric states: Secondary | ICD-10-CM | POA: Insufficient documentation

## 2019-02-18 DIAGNOSIS — C50411 Malignant neoplasm of upper-outer quadrant of right female breast: Secondary | ICD-10-CM | POA: Insufficient documentation

## 2019-02-18 DIAGNOSIS — Z923 Personal history of irradiation: Secondary | ICD-10-CM | POA: Insufficient documentation

## 2019-02-18 DIAGNOSIS — M791 Myalgia, unspecified site: Secondary | ICD-10-CM | POA: Insufficient documentation

## 2019-02-18 MED ORDER — TAMOXIFEN CITRATE 20 MG PO TABS: 20.0000 mg | ORAL_TABLET | Freq: Every day | ORAL | 3 refills | Status: DC

## 2019-02-18 NOTE — Assessment & Plan Note (Signed)
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 Adjuvant radiation therapy started 08/03/2015 followed by 2. Adjuvant antiestrogen therapy with tamoxifen 20 mg dailystarted 09/28/2015  Tamoxifen Toxicities: Denies any hot flashes or myalgias. Renewed tamoxifen for another year  Breast cancer surveillance: 1.  Breast exam 02/18/2019: Benign 2.  Mammogram 07/31/2018: No evidence of malignancy breast density category C Patient may go to [place] next year and if she does that we can transfer her records to the next doctors in [place].  Return to clinic in 1 year for follow-up 

## 2019-02-19 ENCOUNTER — Telehealth: Payer: Self-pay | Admitting: Pediatric Intensive Care

## 2019-02-19 NOTE — Telephone Encounter (Signed)
Left message for staff to call back regarding an appointment. Lisette Abu RN BSN CNP [phone]

## 2019-04-22 ENCOUNTER — Other Ambulatory Visit: Payer: Self-pay | Admitting: Hematology and Oncology

## 2019-04-22 MED FILL — TAMOXIFEN 20 MG TABLET: 20 | 90 days supply | Qty: 90 | Fill #0

## 2019-06-13 ENCOUNTER — Other Ambulatory Visit: Payer: Self-pay

## 2019-06-13 ENCOUNTER — Encounter: Payer: Self-pay | Admitting: Obstetrics & Gynecology

## 2019-06-13 ENCOUNTER — Ambulatory Visit (INDEPENDENT_AMBULATORY_CARE_PROVIDER_SITE_OTHER): Payer: Self-pay | Admitting: Obstetrics & Gynecology

## 2019-06-13 VITALS — BP 105/55 | HR 81 | Ht 64.0 in | Wt 165.1 lb

## 2019-06-13 DIAGNOSIS — N751 Abscess of Bartholin's gland: Secondary | ICD-10-CM

## 2019-06-13 MED ORDER — SULFAMETHOXAZOLE-TRIMETHOPRIM 800-160 MG PO TABS: 1.0000 | ORAL_TABLET | Freq: Two times a day (BID) | ORAL | 0 refills | Status: DC

## 2019-06-13 NOTE — Progress Notes (Signed)
[id number] vulvar abscess.  49 y.o. [id number] No LMP recorded. Patient is perimenopausal. No Known Allergies  Recurrent enlarged abscess at right posterior vulva noted. She gave consent for I&D Bartholin Cyst I&D and Word Catheter Placement Enlarged abscess palpated in front of the hymenal ring around 5 or 7 o' clock on the right.  Written informed consent was obtained.  Discussed complications and possible outcomes of procedure including recurrence of cyst, scarring leading to infection, bleeding, dyspareunia, distortion of anatomy.  Patient was examined in the dorsal lithotomy position and mass was identified.  The area was prepped with Iodine and draped in a sterile manner. 1% Lidocaine (3 ml) was then used to infiltrate area on top of the cyst, behind the hymenal ring.  A 7 mm incision was made using a sterile scapel. Upon palpation of the mass, a moderate amount of bloody purulent drainage was expressed through the incision. A hemostat was used to break up loculations, which resulted in expression of more bloody purulent drainage.   The open cyst was then copiously irrigated with normal saline, and a Word catheter was placed. 1.5 ml of sterile water was used to inflate the catheter balloon.  The end of the catheter was tucked into the vagina.  Patient tolerated the procedure well, reported feeling " a lot better." - Bactrim DS bid x 7 days for treatment - Recommended Sitz baths bid and Motrin and Percocet was given  prn pain.   She was told to call to be examined if she experiences increasing swelling, pain, vaginal discharge, or fever.  - She was instructed to wear a peripad to absorb discharge, and to maintain pelvic rest while the Word catheter is in place.  -The catheter will be left in place for at least two to four weeks to promote formation of an epithelialized tract for permanent drainage of glandular secretions.  - She will need an appointment in [facility] 4-6 weeks from now for  removal of word catheter.  Woodroe Mode, MD 06/13/2019

## 2019-06-13 NOTE — Patient Instructions (Signed)
Quiste de [employer] Bartholin's Cyst  Un quiste de Bartolino es un saco lleno de lquido que se forma en una glndula de [place]. Las glndulas de Bartolino son pequeas glndulas ubicadas en los pliegues de la piel alrededor del orificio de la vagina (labios de la vulva). Estas glndulas secretan un lquido que humedece (lubrica) la parte externa de la vagina durante las relaciones sexuales. Un quiste que no es grande ni est infectado puede no causar problemas ni requerir Clinical research associate. Si el quiste se infecta, se denomina absceso de [place]. Un absceso puede causar sntomas como dolor e hinchazn y es ms probable que requiera tratamiento. Cules son las causas? Esta afeccin puede producirse por una glndula de Bartolino obstruida. Estas glndulas pueden obstruirse debido a la acumulacin natural de lquidos y aceites. La presencia de bacterias dentro del quiste puede causar una infeccin. En muchos de [employer], se desconoce la causa. Qu incrementa el riesgo? Puede tener un riesgo ms alto de Actor un quiste o un absceso de Bartolino si:  Est en edad frtil.  Tiene antecedentes de quistes o abscesos de [place].  Tiene diabetes.  Tiene una ITS (infeccin de transmisin sexual). Cules son los signos o los sntomas? Entre los sntomas, se pueden incluir los siguientes:  Un bulto o una hinchazn en los labios de la vulva, cerca del orificio inferior de la vagina.  Molestias o dolor. Esto puede empeorar al [employer] sexuales o al caminar.  Enrojecimiento, hinchazn y secrecin de lquido en el rea. Estos podran ser signos de un absceso. La gravedad de los sntomas depende del tamao del quiste y si est infectado. La infeccin agrava los sntomas. Cmo se diagnostica? Esta afeccin se puede diagnosticar en funcin de lo siguiente:  Los sntomas y los antecedentes mdicos.  Un examen fsico para detectar hinchazn en el rea vaginal. Es posible que deba  acostarse boca arriba en una camilla con los pies en los soportes para que Hotel manager.  Anlisis de sangre para verificar si hay infecciones.  Extraccin de [place] de lquido del quiste o absceso (biopsia) para Geophysical data processor. Es posible que trabaje con un mdico especializado en [place] (gineclogo) para recibir un diagnstico y Clinical research associate. Cmo se trata? Si el quiste es pequeo, no est infectado y no causa sntomas, tal vez no sea necesario Building services engineer. Estos quistes a menudo desaparecen por s solos, con cuidados en el hogar como baos calientes o compresas tibias. Si tiene un quiste o absceso grande, el tratamiento puede incluir lo siguiente:  Antibiticos.  Un procedimiento para drenar el lquido que se encuentra en el interior del quiste o absceso. Estos procedimientos involucran una incisin en el quiste o absceso para drenar el lquido y luego puede realizarse alguno de los siguientes procedimientos: ? Puede colocarse un pequeo tubo delgado (catter) dentro del quiste o absceso para evitar que se cierre y vuelva a llenarse de lquido (fistulizacin). El catter se extraer en la visita de seguimiento. ? Los bordes de la incisin pueden suturarse con puntos en la piel para que el quiste o absceso se mantenga abierto (Carnuel). Esto permite que siga drenando y no se vuelva a llenar de lquido. Si tiene quistes o abscesos que vuelven a formarse (recurrentes) y han requerido incisin y Advertising account executive veces, el mdico puede plantearle la posibilidad de Ardelia Mems ciruga para extirpar la glndula de [place]. Siga estas indicaciones en su casa: Medicamentos  [employer] de venta libre y los recetados solamente  como se lo haya indicado el mdico.  Si le recetaron un antibitico, tmelo como se lo haya indicado el mdico. No deje de tomar o usar el antibitico aunque la afeccin mejore. Control del dolor y de la hinchazn  [place]ue[place]  con un bao de asiento para Therapist, occupational y la hinchazn. Un bao de asiento es un bao con agua tibia en el cual el agua solo le llega hasta la cadera y debe cubrir las nalgas. Puede tomar baos de asiento varias veces al [employer].  Aplique calor en la zona afectada con la frecuencia que sea necesaria. Use la fuente de calor que el mdico le recomiende, como una compresa de calor hmedo o una almohadilla trmica. ? Coloque una [employer] piel y la fuente de Freight forwarder. ? Aplique calor durante 20 a 27minutos. ? Retire la fuente de calor si la piel se pone de color rojo brillante. Esto es especialmente importante si no puede sentir dolor, calor o fro. Puede correr un riesgo mayor de sufrir quemaduras. Indicaciones generales  Si se dren el quiste o absceso, siga las indicaciones del mdico acerca del cuidado de la herida. Utilice toallas femeninas cuando lo necesite para absorber cualquier secrecin.  No apriete ni haga presin [employer].  No tenga relaciones sexuales hasta que el quiste haya desaparecido o la herida del drenaje haya sanado.  Tome estas medidas para ayudar a prevenir que vuelva a formarse un quiste de Engineer, manufacturing y que se desarrollen otros quistes de Bartolino: ? Tome un bao o una ducha una [place]. Higienice el rea vaginal con agua y un jabn suave cuando se bae. ? Practique el sexo seguro para prevenir las ITS. Hable con el mdico sobre cmo prevenir las ITS y qu formas de mtodos de control de la natalidad (anticonceptivos) pueden ser mejores para usted.  Concurra a todas las visitas de control como se lo haya indicado el mdico. Esto es importante. Comunquese con un mdico si:  Tiene fiebre.  Presenta enrojecimiento, hinchazn o dolor alrededor del quiste.  Tiene lquido, sangre, pus o mal olor que proviene del [place].  Tiene un quiste que [place] de tamao o vuelve a formarse. Resumen  Un quiste de Bartolino es un saco lleno de lquido que se forma en una  glndula de [place]. Estas glndulas se encuentran en los pliegues de la piel alrededor del orificio de la vagina (labios de la vulva).  Si el quiste es pequeo, no est infectado y no causa sntomas, tal vez no sea necesario Building services engineer. Estos quistes a menudo desaparecen por s solos, con cuidados en el hogar como baos calientes o compresas tibias.  Si tiene un quiste o absceso grande, es posible que el mdico realice un procedimiento para Musician lquido.  Si tiene quistes o abscesos que vuelven a formarse (recurrentes) y han requerido incisin y Advertising account executive veces, el mdico puede plantearle la posibilidad de Ardelia Mems ciruga para extirpar la glndula de [place]. Esta informacin no tiene Marine scientist el consejo del mdico. Asegrese de hacerle al mdico cualquier pregunta que tenga. Document Revised: 11/28/2017 Document Reviewed: 11/28/2017 Elsevier Patient Education  2020 [employer].

## 2019-06-19 ENCOUNTER — Telehealth: Payer: Self-pay | Admitting: *Deleted

## 2019-06-19 NOTE — Telephone Encounter (Signed)
Call received from pt's daughter who stated that her mother's catheter fell out. I verified with pt that it is ok to have the daughter interpret for the phone call. Pt states that she is having some swelling where the incision had been made on 5/27. She denies bleeding but is having drainage. She is having increased pain and discomfort of the area. She denies using sitz baths twice daily - states she was given this instruction. I advised that due to the increased pain and swelling, we would like to see Nicole Holloway in the office. I offered appt for this afternoon or tomorrow @ [phone]. Pt stated that she will come in tomorrow @ [phone].

## 2019-06-20 ENCOUNTER — Encounter: Payer: Self-pay | Admitting: *Deleted

## 2019-06-20 ENCOUNTER — Encounter: Payer: Self-pay | Admitting: Obstetrics and Gynecology

## 2019-06-20 ENCOUNTER — Other Ambulatory Visit: Payer: Self-pay

## 2019-06-20 ENCOUNTER — Ambulatory Visit (INDEPENDENT_AMBULATORY_CARE_PROVIDER_SITE_OTHER): Payer: Self-pay | Admitting: Obstetrics and Gynecology

## 2019-06-20 VITALS — BP 90/53 | HR 69 | Wt 164.5 lb

## 2019-06-20 DIAGNOSIS — N751 Abscess of Bartholin's gland: Secondary | ICD-10-CM

## 2019-06-20 DIAGNOSIS — Z789 Other specified health status: Secondary | ICD-10-CM

## 2019-06-20 MED ORDER — SULFAMETHOXAZOLE-TRIMETHOPRIM 800-160 MG PO TABS: 1.0000 | ORAL_TABLET | Freq: Two times a day (BID) | ORAL | 0 refills | Status: DC

## 2019-06-20 NOTE — Progress Notes (Signed)
GYNECOLOGY OFFICE FOLLOW UP NOTE  History:  49 y.o. [id number] here today for follow up for vulvar abscess. Had word catheter placed with I&D on 06/13/19. Reports word catheter fell out 2 days ago and since then, it has not drained at all and she is having swelling and discomfort that is worsening. She has continued antibiotics.     Past Medical History:  Diagnosis Date  . Abnormal mammogram with microcalcification 07/03/2014   Left Breast calcifications  . Breast cancer ([place])   . Breast cancer ([place]) 04/2015   surgery, radiation, Tamoxifen  . Breast cancer ([place])   . Constipation   . Elevated liver enzymes 06/09/2014   AST resolved, ALT almost normal on recheck 07/2014, Abdominal Ultrasound 09/05/2014 with [facility] showed Hepatic steatosis  . External hemorrhoids   . Hepatic steatosis 09/05/2014   With elevated liver enzymes  . History of radiation therapy 08/03/15- 08/28/15   Right Breast  . Hyperglycemia 06/09/2014   101 fasting;  repeat 95  . Hyperlipidemia 06/04/2014  . Obesity 12/01/2016  . Seasonal allergies   . Varicose veins of both lower extremities     Past Surgical History:  Procedure Laterality Date  . BREAST BIOPSY Right 05/14/2015   U/S Core- Malignant  . BREAST BIOPSY Left 05/14/2015   Stereo- Benign  . BREAST LUMPECTOMY Right 06/16/2015  . BREAST LUMPECTOMY WITH RADIOACTIVE SEED AND SENTINEL LYMPH NODE BIOPSY Right 06/16/2015   Procedure: BREAST LUMPECTOMY WITH RADIOACTIVE SEED AND SENTINEL LYMPH NODE BIOPSY;  Surgeon: Erroll Luna, MD;  Location: [place];  Service: General;  Laterality: Right;  . CESAREAN SECTION  1998     Current Outpatient Medications:  .  sulfamethoxazole-trimethoprim (BACTRIM DS) 800-160 MG tablet, Take 1 tablet by mouth 2 (two) times daily., Disp: 14 tablet, Rfl: 0 .  tamoxifen (NOLVADEX) 20 MG tablet, TAKE 1 TABLET BY MOUTH ONCE A DAY, Disp: 90 tablet, Rfl: 3 .  fexofenadine (ALLEGRA) 60 MG tablet, Take 60 mg by mouth 2 (two)  times daily., Disp: , Rfl:   The following portions of the patient's history were reviewed and updated as appropriate: allergies, current medications, past family history, past medical history, past social history, past surgical history and problem list.   Review of Systems:  Pertinent items noted in HPI and remainder of comprehensive ROS otherwise negative.   Objective:  Physical Exam BP (!) 90/53   Pulse 69   Wt 164 lb 8 oz (74.6 kg)   BMI 28.24 kg/m  CONSTITUTIONAL: Well-developed, well-nourished female in no acute distress.  HENT:  Normocephalic, atraumatic. External right and left ear normal. Oropharynx is clear and moist EYES: Conjunctivae and EOM are normal. Pupils are equal, round, and reactive to light. No scleral icterus.  NECK: Normal range of motion, supple, no masses SKIN: Skin is warm and dry. No rash noted. Not diaphoretic. No erythema. No pallor. NEUROLOGIC: Alert and oriented to person, place, and time. Normal reflexes, muscle tone coordination. No cranial nerve deficit noted. PSYCHIATRIC: Normal mood and affect. Normal behavior. Normal judgment and thought content. CARDIOVASCULAR: Normal heart rate noted RESPIRATORY: Effort normal, no problems with respiration noted ABDOMEN: Soft, no distention noted.   PELVIC: Normal appearing external genitalia with enlarged 3 x 4 cm swollen bartholins on right labia, prior incision healed over, very tender MUSCULOSKELETAL: Normal range of motion. No edema noted.  Exam done with chaperone present.  Labs and Imaging No results found.  Assessment & Plan:   1. Bartholin's gland abscess - I&D  today (see note) with word catheter placed, significant amount of purulent drainage - refill on bactrim - sulfamethoxazole-trimethoprim (BACTRIM DS) 800-160 MG tablet; Take 1 tablet by mouth 2 (two) times daily.  Dispense: 14 tablet; Refill: 0  2. Language barrier Spanish interpretor used   Routine preventative health maintenance  measures emphasized. Please refer to After Visit Summary for other counseling recommendations.   Return for keep scheduled appt.  Total face-to-face time with patient: 25 minutes. Over 50% of encounter was spent on counseling and coordination of care.  Feliz Beam, M.D. Attending Center for [employer] Fish farm manager)

## 2019-06-20 NOTE — Progress Notes (Signed)
° ° °  GYNECOLOGY PROCEDURE NOTE  Nicole Holloway is a 49 y.o. [phone] here for incision and drainage of abscess on right bartholins abscess.   Reviewed risks of incision and drainage including worsening infection, bleeding, damage to surrounding tissue and organ, inability to drain abscess, pain, possible need for further intervention. She verbalizes understanding and affirms desire to proceed. Consent signed.   Incision and drainage Procedure Patient identified, informed consent performed, consent signed. An adequate timeout was performed. The right labia was exposed and prepped with betadine and then injected with 2 mL 1% lidocaine. A small incision was made in a vertical manner starting from the prior incision to good effect and exudate started to drain. The abscess was then further explored and loculations were broken up with a sterile hemostat. A word catheter was inserted and filled with 3 mL sterile saline. Hemostasis obtained with small amount of pressure. She tolerated the procedure well.   Post incision instructions were given to patient. Antibiotics were sent to the pharmacy with instructions. She will follow up in 4 weeks.    Feliz Beam, M.D. Attending Center for [employer] Fish farm manager)

## 2019-06-20 NOTE — Progress Notes (Signed)
Here because ward catheter fell out.  Chimere Klingensmith,RN

## 2019-07-04 ENCOUNTER — Encounter: Payer: Self-pay | Admitting: Internal Medicine

## 2019-07-04 ENCOUNTER — Ambulatory Visit: Payer: Self-pay | Admitting: Internal Medicine

## 2019-07-04 ENCOUNTER — Other Ambulatory Visit: Payer: Self-pay

## 2019-07-04 ENCOUNTER — Other Ambulatory Visit: Payer: Self-pay | Admitting: Internal Medicine

## 2019-07-04 VITALS — BP 102/68 | HR 66 | Resp 12 | Ht 61.0 in | Wt 163.0 lb

## 2019-07-04 DIAGNOSIS — N751 Abscess of Bartholin's gland: Secondary | ICD-10-CM

## 2019-07-04 DIAGNOSIS — Z79899 Other long term (current) drug therapy: Secondary | ICD-10-CM

## 2019-07-04 DIAGNOSIS — C50411 Malignant neoplasm of upper-outer quadrant of right female breast: Secondary | ICD-10-CM

## 2019-07-04 DIAGNOSIS — Z6830 Body mass index (BMI) 30.0-30.9, adult: Secondary | ICD-10-CM

## 2019-07-04 DIAGNOSIS — K0889 Other specified disorders of teeth and supporting structures: Secondary | ICD-10-CM

## 2019-07-04 DIAGNOSIS — N939 Abnormal uterine and vaginal bleeding, unspecified: Secondary | ICD-10-CM

## 2019-07-04 DIAGNOSIS — H547 Unspecified visual loss: Secondary | ICD-10-CM

## 2019-07-04 DIAGNOSIS — N898 Other specified noninflammatory disorders of vagina: Secondary | ICD-10-CM

## 2019-07-04 DIAGNOSIS — Z124 Encounter for screening for malignant neoplasm of cervix: Secondary | ICD-10-CM

## 2019-07-04 DIAGNOSIS — E669 Obesity, unspecified: Secondary | ICD-10-CM

## 2019-07-04 DIAGNOSIS — Z1159 Encounter for screening for other viral diseases: Secondary | ICD-10-CM

## 2019-07-04 DIAGNOSIS — Z0000 Encounter for general adult medical examination without abnormal findings: Secondary | ICD-10-CM

## 2019-07-04 DIAGNOSIS — E782 Mixed hyperlipidemia: Secondary | ICD-10-CM

## 2019-07-04 DIAGNOSIS — Z8639 Personal history of other endocrine, nutritional and metabolic disease: Secondary | ICD-10-CM

## 2019-07-04 DIAGNOSIS — Z170 Estrogen receptor positive status [ER+]: Secondary | ICD-10-CM

## 2019-07-04 LAB — POCT WET PREP WITH KOH
KOH Prep POC: NEGATIVE
RBC Wet Prep HPF POC: NEGATIVE
Trichomonas, UA: NEGATIVE
Yeast Wet Prep HPF POC: NEGATIVE

## 2019-07-04 MED ORDER — METAMUCIL SMOOTH TEXTURE 58.6 % PO POWD: ORAL | 12 refills | Status: DC

## 2019-07-04 MED ORDER — METRONIDAZOLE 500 MG PO TABS: ORAL_TABLET | ORAL | 0 refills | Status: DC

## 2019-07-04 NOTE — Patient Instructions (Signed)

## 2019-07-04 NOTE — Progress Notes (Signed)
Subjective:    Patient ID: Nicole Holloway, female   DOB: 15-Jun-1970, 49 y.o.   MRN: [id number]   HPI   CPE with pap  1.  Pap:  Last pap 02/2016 and normal.    2.  Mammogram:  Last mammogram was 07/31/2018 and stable at previous lumpectomy site.  History of right breast cancer in 04/2015.  No other family members with history of breast cancer.    3.  Osteoprevention:  Drinks almond milk one serving daily.  Also, with a serving of cheese and yogurt daily.  She is outside in sun daily.  Normally, walks regularly and jumps rope a lot, but was told by Gyn to avoid a lot of that activity as she is treated for an infected Bartholin cyst.  4.  Guaiac Cards:  1 stool card positive for blood last year 07/13/2018.    5.  Colonoscopy:  Normal colonoscopy 07/04/2017 for Heme positive stool.  No family history of colon cancer.    6.  Immunizations:   Immunization History  Administered Date(s) Administered  . Influenza Inj Mdck Quad Pf 12/01/2016  . Influenza,inj,Quad PF,6+ Mos 02/25/2015, 10/29/2015, 10/19/2017  . Influenza-Unspecified 12/09/2015  . PFIZER SARS-COV-2 Vaccination 03/30/2019, 04/23/2019  . Tdap 02/25/2016     7.  Glucose/Cholesterol:  History of mild elevation of glucose in past--not clear if non fasting.  Dyslipidemia.  Lipid Panel     Component Value Date/Time   CHOL 178 07/02/2018 1021   TRIG 170 (H) 07/02/2018 1021   HDL 37 (L) 07/02/2018 1021   CHOLHDL 3.5 Ratio 03/05/2008 2050   VLDL 14 03/05/2008 2050   LDLCALC 107 (H) 07/02/2018 1021   LABVLDL 34 07/02/2018 1021   Other:  Bartholin cyst/abscess:  Has follow up with Gyn on June 28th.  Underwent I & D and another drain placed on 06/20/2019.  Bactrim DS started--finished 2 days ago.  Drain came out again after 13 days and her drainage has resolved.  No swelling at the site.   Current Meds  Medication Sig  . tamoxifen (NOLVADEX) 20 MG tablet TAKE 1 TABLET BY MOUTH ONCE A DAY    No Known Allergies   Past  Medical History:  Diagnosis Date  . Abnormal mammogram with microcalcification 07/03/2014   Left Breast calcifications  . Breast cancer ([place]) 04/2015   Upper outer quadrant, right breast Estrogen Receptor +: surgery, radiation, Tamoxifen  . Constipation   . External hemorrhoids   . Hepatic steatosis 09/05/2014   With elevated liver enzymes  . History of radiation therapy 08/03/15- 08/28/15   Right Breast  . Hyperglycemia 06/09/2014   101 fasting;  repeat 95  . Hyperlipidemia 06/04/2014  . Obesity 12/01/2016  . Seasonal allergies   . Varicose veins of both lower extremities      Past Surgical History:  Procedure Laterality Date  . BREAST BIOPSY Right 05/14/2015   U/S Core- Malignant  . BREAST BIOPSY Left 05/14/2015   Stereo- Benign  . BREAST LUMPECTOMY Right 06/16/2015  . BREAST LUMPECTOMY WITH RADIOACTIVE SEED AND SENTINEL LYMPH NODE BIOPSY Right 06/16/2015   Procedure: BREAST LUMPECTOMY WITH RADIOACTIVE SEED AND SENTINEL LYMPH NODE BIOPSY;  Surgeon: Erroll Luna, MD;  Location: [place];  Service: General;  Laterality: Right;  . CESAREAN SECTION  1998   Family History  Problem Relation Age of Onset  . Diabetes Father   . Other Father        prostate issues  . Kidney failure Father  67       started dialysis march 2nd  . Heart disease Father        Heart failure was cause of death.  . Other Mother        osteoarthritis, knee replacement  . Mental illness Son 53       Bipolar vs Schizophrenia  . Other Maternal Aunt        hx benign breast lump  . Kidney failure Maternal Grandmother        d. 51s; diabetes  . Diabetes Maternal Grandmother    Social History   Socioeconomic History  . Marital status: Married    Spouse name: Beatriz Chancellor  . Number of children: 2  . Years of education: Not on file  . Highest education level: 9th grade  Occupational History  . Occupation: housecleaning  Tobacco Use  . Smoking status: Never Smoker  . Smokeless tobacco:  Never Used  Vaping Use  . Vaping Use: Never used  Substance and Sexual Activity  . Alcohol use: No  . Drug use: No  . Sexual activity: Yes    Birth control/protection: None  Other Topics Concern  . Not on file  Social History Narrative   LIves at home with husband and two kids.     Son, Elita Quick, now with better control of his health issues   Social Determinants of Health   Financial Resource Strain:   . Difficulty of Paying Living Expenses:   Food Insecurity: No Food Insecurity  . Worried About Charity fundraiser in the Last Year: Never true  . Ran Out of Food in the Last Year: Never true  Transportation Needs: No Transportation Needs  . Lack of Transportation (Medical): No  . Lack of Transportation (Non-Medical): No  Physical Activity:   . Days of Exercise per Week:   . Minutes of Exercise per Session:   Stress:   . Feeling of Stress :   Social Connections:   . Frequency of Communication with Friends and Family:   . Frequency of Social Gatherings with Friends and Family:   . Attends Religious Services:   . Active Member of Clubs or Organizations:   . Attends Archivist Meetings:   Marland Kitchen Marital Status:   Intimate Partner Violence:   . Fear of Current or Ex-Partner:   . Emotionally Abused:   Marland Kitchen Physically Abused:   . Sexually Abused:       Review of Systems  Constitutional: Negative for appetite change and fatigue.  HENT: Positive for dental problem (Pain at times in her molars) and sore throat (mild). Negative for ear pain, hearing loss and rhinorrhea.   Eyes: Positive for visual disturbance (current glasses not working well.).  Respiratory: Positive for cough (minor cough). Negative for shortness of breath.   Cardiovascular: Positive for leg swelling (Generally when more problems with varicosities.). Negative for chest pain.  Gastrointestinal: Positive for blood in stool (No melena.  Does state when constipated, does have blood on tissue.) and constipation  (When she eats bread, she knows she will have problems. ). Negative for abdominal pain and diarrhea.  Genitourinary: Positive for vaginal bleeding (Has a period every 3 months.  She is on Tamoxifen.). Negative for dysuria.  Musculoskeletal: Negative for arthralgias.  Skin: Positive for rash (perineal area since cyst drained.).  Neurological: Negative for weakness and numbness.  Psychiatric/Behavioral: Negative for dysphoric mood. The patient is not nervous/anxious.       Objective:   BP 102/68 (BP Location:  Left Arm, Patient Position: Sitting, Cuff Size: Normal)   Pulse 66   Resp 12   Ht 5\' 1"  (1.549 m)   Wt 163 lb (73.9 kg)   LMP 05/31/2019   BMI 30.80 kg/m   Physical Exam Constitutional:      Appearance: Normal appearance. She is obese.  HENT:     Head: Normocephalic and atraumatic.     Right Ear: Hearing, tympanic membrane, ear canal and external ear normal.     Left Ear: Hearing, tympanic membrane, ear canal and external ear normal.     Nose: Nose normal.     Mouth/Throat:     Mouth: Mucous membranes are moist.     Dentition: Dental caries present.     Pharynx: Oropharynx is clear.  Eyes:     Extraocular Movements: Extraocular movements intact.     Conjunctiva/sclera: Conjunctivae normal.     Pupils: Pupils are equal, round, and reactive to light.     Comments: Discs sharp bilaterally  Neck:     Thyroid: No thyromegaly.  Cardiovascular:     Rate and Rhythm: Normal rate and regular rhythm.     Heart sounds: S1 normal and S2 normal. No murmur heard.  No friction rub. No S3 or S4 sounds.      Comments: No carotid bruits.  Carotid, radial, femoral, DP and PT pulses normal and equal.  Pulmonary:     Effort: Pulmonary effort is normal.     Breath sounds: Normal breath sounds.  Chest:     Breasts:        Right: Skin change (See graphic #1) present. No inverted nipple, mass or tenderness.        Left: No inverted nipple, mass, nipple discharge, skin change or  tenderness.    Abdominal:     General: Bowel sounds are normal.     Palpations: Abdomen is soft. There is no hepatomegaly, splenomegaly or mass.     Tenderness: There is no abdominal tenderness.  Genitourinary:      Comments: Save for right labia with swelling, normal external female genitalia. No cervical or vaginal mucosal lesions. No uterine or adnexal mass or tenderness. Musculoskeletal:        General: Normal range of motion.     Cervical back: Full passive range of motion without pain, normal range of motion and neck supple.  Lymphadenopathy:     Head:     Right side of head: No submental or submandibular adenopathy.     Left side of head: No submental or submandibular adenopathy.     Cervical: No cervical adenopathy.     Upper Body:     Right upper body: No supraclavicular or axillary adenopathy.     Left upper body: No supraclavicular or axillary adenopathy.     Lower Body: No right inguinal adenopathy. No left inguinal adenopathy.  Skin:    General: Skin is warm.     Capillary Refill: Capillary refill takes less than 2 seconds.     Findings: No rash.  Neurological:     Mental Status: She is alert and oriented to person, place, and time.     Cranial Nerves: Cranial nerves are intact.     Sensory: Sensation is intact.     Motor: Motor function is intact.     Coordination: Coordination is intact.     Gait: Gait is intact.     Deep Tendon Reflexes: Reflexes are normal and symmetric.  Psychiatric:  Attention and Perception: Attention and perception normal.        Mood and Affect: Mood normal.        Speech: Speech normal.        Behavior: Behavior normal. Behavior is cooperative.        Thought Content: Thought content normal.        Cognition and Memory: Cognition normal.        Judgment: Judgment normal.      Assessment & Plan   1.  CPE Pap Hepatitis C screen. Mammogram scheduled for July 2021 Guaiac cards x 3, but not when constipated. Completed  COVID vaccination. Flu shot in fall  2.  Constipation:  Recommended using Metamucil daily with 8 oz water  3.  Dyslipidemia/hyperlipidemia:  FLP  4.  History of hyperglycemia:  CMP/A1C  5.  Bartholin's duct cyst, infected:  As per gyn.  Does not appear infected currently.  6.  Dental Pain:  Dental referral.  7.  Decreased Visual Acuity:  [facility] referral.  8.  History of right breast cancer:  Mammogram in July.  9.  Bacterial Vaginosis:  Metronidazole 500 mg twice daily for 7 days.    10.  Vaginal bleeding every 3 months on woman with history of breast cancer on Tamoxifen.  Pelvic ultrasound to be certain no endometrial concern.

## 2019-07-05 ENCOUNTER — Other Ambulatory Visit: Payer: Self-pay

## 2019-07-05 DIAGNOSIS — R921 Mammographic calcification found on diagnostic imaging of breast: Secondary | ICD-10-CM

## 2019-07-05 LAB — COMPREHENSIVE METABOLIC PANEL
ALT: 27 IU/L (ref 0–32)
AST: 22 IU/L (ref 0–40)
Albumin/Globulin Ratio: 1.9 (ref 1.2–2.2)
Albumin: 4.4 g/dL (ref 3.8–4.8)
Alkaline Phosphatase: 67 IU/L (ref 48–121)
BUN/Creatinine Ratio: 20 (ref 9–23)
BUN: 14 mg/dL (ref 6–24)
Bilirubin Total: 0.4 mg/dL (ref 0.0–1.2)
CO2: 23 mmol/L (ref 20–29)
Calcium: 9 mg/dL (ref 8.7–10.2)
Chloride: 107 mmol/L — ABNORMAL HIGH (ref 96–106)
Creatinine, Ser: 0.7 mg/dL (ref 0.57–1.00)
GFR calc Af Amer: 118 mL/min/{1.73_m2} (ref >59)
GFR calc non Af Amer: 103 mL/min/{1.73_m2} (ref >59)
Globulin, Total: 2.3 g/dL (ref 1.5–4.5)
Glucose: 88 mg/dL (ref 65–99)
Potassium: 4.5 mmol/L (ref 3.5–5.2)
Sodium: 143 mmol/L (ref 134–144)
Total Protein: 6.7 g/dL (ref 6.0–8.5)

## 2019-07-05 LAB — LIPID PANEL W/O CHOL/HDL RATIO
Cholesterol, Total: 162 mg/dL (ref 100–199)
HDL: 37 mg/dL — ABNORMAL LOW (ref >39)
LDL Chol Calc (NIH): 95 mg/dL (ref 0–99)
Triglycerides: 171 mg/dL — ABNORMAL HIGH (ref 0–149)
VLDL Cholesterol Cal: 30 mg/dL (ref 5–40)

## 2019-07-05 LAB — CBC WITH DIFFERENTIAL/PLATELET
Basophils Absolute: 0 10*3/uL (ref 0.0–0.2)
Basos: 0 %
EOS (ABSOLUTE): 0.1 10*3/uL (ref 0.0–0.4)
Eos: 2 %
Hematocrit: 39.4 % (ref 34.0–46.6)
Hemoglobin: 13.4 g/dL (ref 11.1–15.9)
Immature Grans (Abs): 0 10*3/uL (ref 0.0–0.1)
Immature Granulocytes: 0 %
Lymphocytes Absolute: 1.8 10*3/uL (ref 0.7–3.1)
Lymphs: 36 %
MCH: 31.5 pg (ref 26.6–33.0)
MCHC: 34 g/dL (ref 31.5–35.7)
MCV: 93 fL (ref 79–97)
Monocytes Absolute: 0.5 10*3/uL (ref 0.1–0.9)
Monocytes: 9 %
Neutrophils Absolute: 2.6 10*3/uL (ref 1.4–7.0)
Neutrophils: 53 %
Platelets: 193 10*3/uL (ref 150–450)
RBC: 4.25 x10E6/uL (ref 3.77–5.28)
RDW: 13 % (ref 11.7–15.4)
WBC: 5 10*3/uL (ref 3.4–10.8)

## 2019-07-05 LAB — HGB A1C W/O EAG: Hgb A1c MFr Bld: 5.3 % (ref 4.8–5.6)

## 2019-07-05 LAB — HEPATITIS C ANTIBODY: Hep C Virus Ab: <0.1 s/co ratio (ref 0.0–0.9)

## 2019-07-05 LAB — CYTOLOGY - PAP

## 2019-07-15 ENCOUNTER — Telehealth: Payer: Self-pay

## 2019-07-15 ENCOUNTER — Other Ambulatory Visit: Payer: Self-pay

## 2019-07-15 ENCOUNTER — Ambulatory Visit (INDEPENDENT_AMBULATORY_CARE_PROVIDER_SITE_OTHER): Payer: Self-pay | Admitting: Obstetrics & Gynecology

## 2019-07-15 ENCOUNTER — Encounter: Payer: Self-pay | Admitting: Obstetrics & Gynecology

## 2019-07-15 VITALS — BP 103/52 | HR 77 | Ht 63.0 in | Wt 164.6 lb

## 2019-07-15 DIAGNOSIS — N751 Abscess of Bartholin's gland: Secondary | ICD-10-CM

## 2019-07-15 NOTE — Patient Instructions (Signed)
Quiste de [employer] Bartholin's Cyst  Un quiste de Bartolino es un saco lleno de lquido que se forma en una glndula de [place]. Las glndulas de Bartolino son pequeas glndulas que se encuentran en los pliegues de la piel alrededor del orificio de la vagina (labios de la vulva). Este tipo de quiste forma un bulto o una hinchazn cerca del orificio inferior de la vagina. Si tiene un quiste pequeo y no infectado, es posible que pueda tratarlo en su casa. Si el quiste se infecta, puede causar dolor y es posible que el mdico deba drenarlo. Un quiste de Bartolino infectado se denomina absceso de [place]. Siga estas indicaciones en su casa: Medicamentos  [employer] de venta libre y los recetados solamente como se lo haya indicado el mdico.  Si le recetaron un antibitico, tmelo como se lo haya indicado el mdico. No deje de tomar el antibitico aunque comience a sentirse mejor. Control del dolor y de la hinchazn  Pruebe con un bao de asiento para Therapist, occupational y la hinchazn. Un bao de asiento es un bao con agua tibia en el cual el agua solo le llega hasta la cadera y debe cubrir las nalgas. Puede tomar baos de asiento algunas veces al [employer].  Use calor en la zona afectada con la frecuencia que sea necesaria. Use la fuente de calor que el mdico le recomiende, como una compresa de calor hmedo o una almohadilla trmica. ? Coloque una [employer] piel y la fuente de Freight forwarder. ? Aplique calor durante 20 a 94minutos. ? Retire la fuente de calor si la piel se pone de color rojo brillante. Esto es muy importante si no puede sentir el dolor, el calor o el fro. Puede correr un riesgo mayor de sufrir quemaduras. Indicaciones generales  Si el quiste o absceso fue drenado: ? Siga las instrucciones del mdico en lo que respecta al cuidado de la herida. ? Utilice toallas femeninas para absorber el lquido.  No apriete ni haga presin [employer].  No tenga relaciones  sexuales hasta que el quiste haya desaparecido o la herida del drenaje haya sanado.  Tome estas medidas para ayudar a prevenir que vuelva a formarse un quiste de Engineer, manufacturing y que se formen otros quistes de Bartolino: ? Building services engineer un bao o una ducha Engineer, water. Higienice el rea vaginal con agua y un jabn suave cuando se bae. ? Practique sexo seguro para prevenir las ITS (infecciones de transmisin sexual). Hable con el mdico sobre cmo prevenir las ITS y qu formas de mtodos de control de la natalidad (anticonceptivos) pueden ser mejores para usted.  Concurra a todas las visitas de control como se lo haya indicado el mdico. Esto es importante. Comunquese con un mdico si:  Tiene fiebre.  Tiene enrojecimiento, hinchazn o dolor alrededor del quiste.  Tiene lquido, sangre, pus o mal olor que proviene del [employer].  Tiene un quiste que [place] de tamao o vuelve a formarse. Resumen  Un quiste de Bartolino es un saco lleno de lquido que se forma en una glndula de [place]. Estas pequeas glndulas se encuentran en los pliegues de la piel alrededor del orificio de la vagina (labios de la vulva).  Este tipo de quiste forma un bulto o una hinchazn cerca del orificio inferior de la vagina. Un quiste de Bartolino infectado se denomina absceso de [place].  Pruebe con baos de asiento algunas veces al da para ayudar a Therapist, occupational y la hinchazn.  No apriete  ni haga presin [employer]. Esta informacin no tiene Marine scientist el consejo del mdico. Asegrese de hacerle al mdico cualquier pregunta que tenga. Document Revised: 11/28/2017 Document Reviewed: 11/28/2017 Elsevier Patient Education  2020 [employer].

## 2019-07-15 NOTE — Telephone Encounter (Signed)
Patient called and spoke [place]. States patient has some questions about her ultrasound. Not sure which ultrasound she is referring to. Patient has 2 orders in chart for 2 different ultrasounds. Antony Madura pleas call and clarify what patient really needs.

## 2019-07-15 NOTE — Progress Notes (Signed)
Patient ID: Nicole Holloway, female   DOB: [birth year], 49 y.o.   MRN: [id number]  Chief Complaint  Patient presents with  . Follow-up   S/p I&D Bartholin's gland HPI Nicole Holloway is a 49 y.o. female.  [id number] No LMP recorded. Patient is perimenopausal. LMP current I&D Bartholin's gland 5/27 and 6/3, the Word catheter was removed, now only some itching and no pain on swelling HPI  Past Medical History:  Diagnosis Date  . Abnormal mammogram with microcalcification 07/03/2014   Left Breast calcifications  . Breast cancer ([place])   . Breast cancer ([place]) 04/2015   surgery, radiation, Tamoxifen  . Breast cancer ([place])   . Constipation   . External hemorrhoids   . Hepatic steatosis 09/05/2014   With elevated liver enzymes  . History of radiation therapy 08/03/15- 08/28/15   Right Breast  . Hyperglycemia 06/09/2014   101 fasting;  repeat 95  . Hyperlipidemia 06/04/2014  . Obesity 12/01/2016  . Seasonal allergies   . Varicose veins of both lower extremities     Past Surgical History:  Procedure Laterality Date  . BREAST BIOPSY Right 05/14/2015   U/S Core- Malignant  . BREAST BIOPSY Left 05/14/2015   Stereo- Benign  . BREAST LUMPECTOMY Right 06/16/2015  . BREAST LUMPECTOMY WITH RADIOACTIVE SEED AND SENTINEL LYMPH NODE BIOPSY Right 06/16/2015   Procedure: BREAST LUMPECTOMY WITH RADIOACTIVE SEED AND SENTINEL LYMPH NODE BIOPSY;  Surgeon: Erroll Luna, MD;  Location: [facility];  Service: General;  Laterality: Right;  . CESAREAN SECTION  1998    Family History  Problem Relation Age of Onset  . Diabetes Father   . Other Father        prostate issues  . Kidney failure Father 65       started dialysis march 2nd  . Heart disease Father        Heart failure was cause of death.  . Other Mother        osteoarthritis, knee replacement  . Mental illness Son 40       Bipolar vs Schizophrenia  . Other Maternal Aunt        hx benign breast lump  . Kidney failure  Maternal Grandmother        d. 23s; diabetes  . Diabetes Maternal Grandmother     Social History Social History   Tobacco Use  . Smoking status: Never Smoker  . Smokeless tobacco: Never Used  Vaping Use  . Vaping Use: Never used  Substance Use Topics  . Alcohol use: No  . Drug use: No    No Known Allergies  Current Outpatient Medications  Medication Sig Dispense Refill  . magnesium 30 MG tablet Take 30 mg by mouth 2 (two) times daily.    . tamoxifen (NOLVADEX) 20 MG tablet TAKE 1 TABLET BY MOUTH ONCE A DAY 90 tablet 3  . fexofenadine (ALLEGRA) 60 MG tablet Take 60 mg by mouth 2 (two) times daily. (Patient not taking: Reported on 07/04/2019)    . psyllium (METAMUCIL SMOOTH TEXTURE) 58.6 % powder 1 packet in 8 oz water once daily. (Patient not taking: Reported on 07/15/2019) 283 g 12   No current facility-administered medications for this visit.    Review of Systems Review of Systems  Genitourinary: Positive for vaginal bleeding.    Blood pressure (!) 103/52, pulse 77, height 5\' 3"  (1.6 m), weight 164 lb 9.6 oz (74.7 kg).  Physical Exam Physical Exam Constitutional:      Appearance:  Normal appearance.  Pulmonary:     Effort: Pulmonary effort is normal.  Genitourinary:    Comments: No deformity noted but on close inspection 3 cm Bartholin's gland present on the right with no tenderness Neurological:     Mental Status: She is alert.     Data Reviewed Previous office notes and pap results  Assessment Bartholin's gland cyst  Plan  If symptomatic be sure to place and maintain drainage or consider marsupialization    Emeterio Reeve 07/15/2019, 11:29 AM

## 2019-07-17 ENCOUNTER — Encounter: Payer: Self-pay | Admitting: Internal Medicine

## 2019-07-17 DIAGNOSIS — Z8639 Personal history of other endocrine, nutritional and metabolic disease: Secondary | ICD-10-CM | POA: Insufficient documentation

## 2019-07-17 DIAGNOSIS — N939 Abnormal uterine and vaginal bleeding, unspecified: Secondary | ICD-10-CM | POA: Insufficient documentation

## 2019-07-17 DIAGNOSIS — K0889 Other specified disorders of teeth and supporting structures: Secondary | ICD-10-CM | POA: Insufficient documentation

## 2019-07-17 DIAGNOSIS — H547 Unspecified visual loss: Secondary | ICD-10-CM | POA: Insufficient documentation

## 2019-07-23 ENCOUNTER — Other Ambulatory Visit: Payer: Self-pay

## 2019-07-23 ENCOUNTER — Ambulatory Visit: Payer: Self-pay | Admitting: *Deleted

## 2019-07-23 VITALS — BP 105/58 | Temp 97.1°F | Wt 166.7 lb

## 2019-07-23 DIAGNOSIS — Z1239 Encounter for other screening for malignant neoplasm of breast: Secondary | ICD-10-CM

## 2019-07-23 NOTE — Patient Instructions (Addendum)
Informed Mertie Moores Ramos about breast self awareness. Patient did not need a Pap smear today due to last Pap smear was on 07/04/2019 per patient. Told patient about free cervical cancer screenings and let her know [facility] will cover Pap smears every 3 years unless has a history of abnormal Pap smears. Referred patient to the [facility] for diagnostic mammogram. Appointment scheduled for August 02, 2019 at 10:20am. Patient aware of appointment and will be there. [place] verbalized understanding.  Vania Rea, RN, FNP student 1:09 PM

## 2019-07-23 NOTE — Progress Notes (Signed)
[name] is a 49 y.o. female who presents to [facility] clinic today for a follow up for right breast calcifications and lumpectomy site.   Pap Smear: Pap smear not completed today. Last Pap smear was 07/04/2019 at [facility] clinic and was normal. HPV testing was not completed at that time. Per patient has no history of an abnormal Pap smear. Last Pap smear result is available in Epic.   Physical exam: Breasts Breasts symmetrical. No skin abnormalities bilateral breasts. No nipple retraction bilateral breasts. No nipple discharge bilateral breasts. No lymphadenopathy. No lumps palpated bilateral breasts. An old scar is noted on the areola above her nipple on the right breast. Patient reports bilateral breast pain a few weeks ago that she states subsided with the onset of her menstrual cycle.     Pelvic/Bimanual Pap is not indicated today.    Smoking History: Patient has never smoked.  Patient Navigation: Patient education provided. Access to services provided for patient through [facility] program. Nicole Holloway, [place] interpreter provided through [facility].   Breast and Cervical Cancer Risk Assessment: Patient does not have family history of breast cancer, known genetic mutations, or radiation treatment to the chest before age 34. Patient does not have history of cervical dysplasia, immunocompromised, or DES exposure in-utero.  Risk Assessment    Risk Scores      07/23/2019 07/13/2017   Last edited by: Demetrius Revel, LPN Rolena Infante H, LPN   5-year risk: 0.8 %    Lifetime risk: 7 %           A: [facility] exam without pap smear Follow up for right breast calcifications and lumpectomy site.  PVania Rea, RN, FNP student 07/23/2019 1:03 PM   Attestation of Supervision of Student:  I confirm that I have verified the information documented in the nurse practitioner student's note and that I have also personally reperformed the history, physical exam and all medical  decision making activities.  I have verified that all services and findings are accurately documented in this student's note; and I agree with management and plan as outlined in the documentation. I have also made any necessary editorial changes.  Left breast slightly larger than right breast due to history of right breast lumpectomy 06/16/2015.No skin abnormalities left breast. Scar observed on right upper outer breast from lumpectomy and skin darkened right breast due to history of radiation.No nipple retraction bilateral breasts. No nipple discharge bilateral breasts. No lymphadenopathy. No lumps palpated bilateral breasts.No complaints of pain or tenderness on exam.  [place], [place] for [employer], [place] 07/23/2019 3:54 PM

## 2019-08-02 ENCOUNTER — Other Ambulatory Visit: Payer: Self-pay

## 2019-08-02 ENCOUNTER — Ambulatory Visit
Admission: RE | Admit: 2019-08-02 | Discharge: 2019-08-02 | Disposition: A | Discharge status: Home / Self Care | Payer: No Typology Code available for payment source | Source: Ambulatory Visit | Attending: Obstetrics and Gynecology | Admitting: Obstetrics and Gynecology

## 2019-08-02 DIAGNOSIS — R921 Mammographic calcification found on diagnostic imaging of breast: Secondary | ICD-10-CM

## 2019-08-06 MED FILL — TAMOXIFEN 20 MG TABLET: 20 | 90 days supply | Qty: 90 | Fill #1

## 2019-11-18 MED FILL — TAMOXIFEN 20 MG TABLET: 20 | 90 days supply | Qty: 90 | Fill #2

## 2019-12-17 ENCOUNTER — Telehealth: Payer: Self-pay | Admitting: Internal Medicine

## 2019-12-17 NOTE — Telephone Encounter (Signed)
Pelvic ultrasound to follow up on vaginal bleeding in patient on Tamoxifen for treatment of breast cancer showed normal endometrial stripe (lining of uterus was fine and no concern).   She does have a fibroid that could be causing some of the bleeding.  Ovaries also look okay.   Let her know I apologize for the delay in letting her know.

## 2019-12-23 NOTE — Telephone Encounter (Signed)
Spoke with patient to notify her about her lab results. Patient wants you know that she's ben having pain in her lower abdomen and also some incontinency when she makes efforts. Please advise if she needs an appointment to discuss further or not.

## 2019-12-24 NOTE — Telephone Encounter (Signed)
Yes, she should make an appt

## 2019-12-31 NOTE — Telephone Encounter (Signed)
Appointment was scheduled for 04/02/2020. Patient is aware of appointment.

## 2020-03-02 MED FILL — TAMOXIFEN 20 MG TABLET: 20 | 90 days supply | Qty: 90 | Fill #3

## 2020-03-04 ENCOUNTER — Ambulatory Visit (INDEPENDENT_AMBULATORY_CARE_PROVIDER_SITE_OTHER): Payer: Self-pay | Admitting: Obstetrics & Gynecology

## 2020-03-04 ENCOUNTER — Telehealth: Payer: Self-pay | Admitting: Family Medicine

## 2020-03-04 ENCOUNTER — Other Ambulatory Visit: Payer: Self-pay

## 2020-03-04 VITALS — BP 112/59 | HR 90 | Wt 164.4 lb

## 2020-03-04 DIAGNOSIS — N751 Abscess of Bartholin's gland: Secondary | ICD-10-CM

## 2020-03-04 MED ORDER — SULFAMETHOXAZOLE-TRIMETHOPRIM 800-160 MG PO TABS: 1.0000 | ORAL_TABLET | Freq: Two times a day (BID) | ORAL | 0 refills | Status: DC

## 2020-03-04 NOTE — Patient Instructions (Signed)
Quiste de [employer] Bartholin's Cyst  Un quiste de Bartolino es un saco lleno de lquido que se forma en una glndula de [place]. Las glndulas de Bartolino son pequeas glndulas que se encuentran en los pliegues de la piel cerca del orificio de la vagina (labios de la vulva). Este tipo de quiste forma un bulto o una hinchazn cerca del orificio de la vagina. Si tiene un quiste pequeo y no infectado, es posible que pueda tratarlo en su casa. Si el quiste se infecta, puede causar dolor y es posible que el mdico deba drenarlo. Cules son las causas? Esta afeccin puede producirse por una glndula de Bartolino obstruida. Microbios (bacterias) dentro del quiste que pueden causar una infeccin. Cules son los signos o sntomas?  Un bulto o una hinchazn cerca del orificio inferior de la vagina.  Molestias o dolor.  Enrojecimiento, hinchazn y secrecin de lquido en el rea. Cmo se trata? Es posible que no necesite tratamiento si el quiste no causa sntomas. El quiste puede desaparecer por s solo con Art therapist. El cuidado en el hogar incluye baos calientes o terapia de calor. Los quistes grandes o los quistes infectados pueden tratarse con:  Antibiticos.  Procedimiento para drenar el lquido. Los quistes recurrentes tendrn que drenarse muchas veces. El mdico puede hablar con usted sobre la ciruga para extirpar el quiste. Siga estas instrucciones en su casa: Medicamentos  Use los medicamentos de venta libre y los recetados solamente como se lo haya indicado el mdico.  Si le recetaron un antibitico, tmelo como se lo haya indicado el mdico. No deje de tomarlo aunque comience a sentirse mejor. Control del dolor y de la hinchazn  Pruebe con un bao de asiento para Therapist, occupational y la hinchazn. Un bao de asiento es un bao con agua tibia en el cual el agua solo le llega hasta la cadera y debe cubrir las nalgas. Puede tomar baos de asiento algunas veces al  [employer].  Si se lo indican, use calor en la zona afectada con la frecuencia que sea necesaria. Use la fuente de calor que el mdico le recomiende, como una compresa de calor hmedo o una almohadilla trmica. ? Coloque una [employer] piel y la fuente de Freight forwarder. ? Aplique calor durante 20 a 46minutos. ? Retire la fuente de calor si la piel se le pone de color rojo brillante. Esto es [employer]. Si no puede sentir dolor, calor o fro, tiene un mayor riesgo de [place]. Indicaciones generales  Si el quiste fue drenado: ? Siga las instrucciones del mdico en lo que respecta al cuidado de la herida. ? Utilice toallas femeninas para absorber el lquido.  No apriete ni haga presin [employer].  No tenga relaciones sexuales hasta que el quiste haya desaparecido o la herida del drenaje haya sanado.  Tome estas medidas para ayudar a Presenter, broadcasting de un quiste de Bartolino y que se formen otros quistes: ? Tome un bao o una ducha una [place]. Higienice la zona alrededor de la vagina con agua y un jabn suave cuando se bae. ? Practique el sexo seguro para prevenir las ITS. Hable con el mdico sobre cmo prevenir las infecciones de transmisin sexual (ITS) y qu mtodos anticonceptivos pueden ser mejores para usted.  [place] con todas las visitas de seguimiento. Comunquese con un mdico si:  Tiene fiebre.  Tiene ms enrojecimiento, hinchazn o dolor alrededor del quiste.  Tiene lquido, sangre, pus o mal olor que proviene  del quiste.  Tiene un quiste que [place] de tamao o un quiste que vuelve a formarse. Resumen  Un quiste de Bartolino es un saco lleno de lquido que se forma en una glndula de [place]. Estas pequeas glndulas se encuentran en los pliegues de la piel cerca del orificio de la vagina (labios de la vulva).  Este tipo de quiste forma un bulto o una hinchazn cerca del orificio de la vagina.  Pruebe con baos de asiento algunas veces al da para ayudar a  Therapist, occupational y la hinchazn.  No apriete ni haga presin [employer]. Esta informacin no tiene Marine scientist el consejo del mdico. Asegrese de hacerle al mdico cualquier pregunta que tenga. Document Revised: 07/26/2019 Document Reviewed: 07/26/2019 Elsevier Patient Education  [place].

## 2020-03-04 NOTE — Progress Notes (Signed)
Patient ID: Nicole Holloway, female   DOB: 12/30/1970, 50 y.o.   MRN: [id number]  Cc: swelling and pain at vulva for 3 days  HPI Tonya Wantz is a 50 y.o. female.  [id number] No LMP recorded. Patient is perimenopausal. For three days she has had increasing pain from swelling at her right vulva, no drainage. She says it is painful to walk HPI  Past Medical History:  Diagnosis Date  . Abnormal mammogram with microcalcification 07/03/2014   Left Breast calcifications  . Breast cancer ([place]) 04/2015   Upper outer quadrant, right breast Estrogen Receptor +: surgery, radiation, Tamoxifen  . Constipation   . External hemorrhoids   . Hepatic steatosis 09/05/2014   With elevated liver enzymes  . History of radiation therapy 08/03/15- 08/28/15   Right Breast  . Hyperglycemia 06/09/2014   101 fasting;  repeat 95  . Hyperlipidemia 06/04/2014  . Obesity 12/01/2016  . Seasonal allergies   . Varicose veins of both lower extremities     Past Surgical History:  Procedure Laterality Date  . BREAST BIOPSY Right 05/14/2015   U/S Core- Malignant  . BREAST BIOPSY Left 05/14/2015   Stereo- Benign  . BREAST LUMPECTOMY Right 06/16/2015  . BREAST LUMPECTOMY WITH RADIOACTIVE SEED AND SENTINEL LYMPH NODE BIOPSY Right 06/16/2015   Procedure: BREAST LUMPECTOMY WITH RADIOACTIVE SEED AND SENTINEL LYMPH NODE BIOPSY;  Surgeon: Erroll Luna, MD;  Location: [facility];  Service: General;  Laterality: Right;  . CESAREAN SECTION  1998    Family History  Problem Relation Age of Onset  . Diabetes Father   . Other Father        prostate issues  . Kidney failure Father 47       started dialysis march 2nd  . Heart disease Father        Heart failure was cause of death.  . Other Mother        osteoarthritis, knee replacement  . Mental illness Son 9       Bipolar vs Schizophrenia  . Other Maternal Aunt        hx benign breast lump  . Kidney failure Maternal Grandmother        d. 93s;  diabetes  . Diabetes Maternal Grandmother     Social History Social History   Tobacco Use  . Smoking status: Never Smoker  . Smokeless tobacco: Never Used  Vaping Use  . Vaping Use: Never used  Substance Use Topics  . Alcohol use: No  . Drug use: No    No Known Allergies  Current Outpatient Medications  Medication Sig Dispense Refill  . tamoxifen (NOLVADEX) 20 MG tablet TAKE 1 TABLET BY MOUTH ONCE A DAY 90 tablet 3  . fexofenadine (ALLEGRA) 60 MG tablet Take 60 mg by mouth 2 (two) times daily. (Patient not taking: No sig reported)    . magnesium 30 MG tablet Take 30 mg by mouth 2 (two) times daily. (Patient not taking: Reported on 03/04/2020)    . psyllium (METAMUCIL SMOOTH TEXTURE) 58.6 % powder 1 packet in 8 oz water once daily. (Patient not taking: No sig reported) 283 g 12   No current facility-administered medications for this visit.    Review of Systems Review of Systems  Constitutional: Negative.   Gastrointestinal: Negative.   Genitourinary: Positive for vaginal pain (at vulva on right). Negative for dysuria, vaginal bleeding and vaginal discharge.    Blood pressure (!) 112/59, pulse 90, weight 164 lb 6.4 oz (74.6  kg).  Physical Exam Physical Exam Constitutional:      Appearance: Normal appearance.  Pulmonary:     Effort: Pulmonary effort is normal.  Genitourinary:      Comments: 5 cm cystic mass at right vulva fluctuant c/w large Bartholin's abscess. Consent and time out for I&D, prepped with betadine, 10 mg 1% lidocaine injected. Incised with 11 blade and copious pus expressed. Word catheter place and filled with 4 ml saline Neurological:     Mental Status: She is alert.  Psychiatric:        Mood and Affect: Mood normal.        Behavior: Behavior normal.     Data Reviewed   Assessment Bartholin's gland abscess - Plan: sulfamethoxazole-trimethoprim (BACTRIM DS) 800-160 MG tablet    Plan RTC 2 weeks. Keep drain in for at least 4  weeks    Emeterio Reeve 03/04/2020, 3:39 PM

## 2020-03-04 NOTE — Telephone Encounter (Signed)
Called patient with [facility] for interpreter. Patient states for the past 3 days she has had a painful swollen bump on the inside of her labia just like before. Patient states she is in excruciating pain and cannot find relief. She has tried warm compresses and baths with minimal improvement. Offered appt today at 2:15. Patient verbalized understanding & had no questions.

## 2020-03-04 NOTE — Telephone Encounter (Signed)
Spanish - pt states she is having severe abd pain and abd swollen for last 3 days and thinks "she has a cyst that has come back"  Pt wants to know if she needs to go the ER

## 2020-03-04 NOTE — Progress Notes (Signed)
Patient her for evaluation of Bartholin's Cyst. It has been present for 3 days. She feels like it is in a different place and causes her right leg to hurt at the base of the buttocks.

## 2020-03-19 ENCOUNTER — Ambulatory Visit (INDEPENDENT_AMBULATORY_CARE_PROVIDER_SITE_OTHER): Payer: Self-pay | Admitting: Obstetrics & Gynecology

## 2020-03-19 ENCOUNTER — Other Ambulatory Visit: Payer: Self-pay

## 2020-03-19 VITALS — BP 102/70 | HR 86 | Wt 165.2 lb

## 2020-03-19 DIAGNOSIS — N751 Abscess of Bartholin's gland: Secondary | ICD-10-CM

## 2020-03-19 NOTE — Progress Notes (Signed)
Subjective:     Nicole Holloway is a 50 y.o. female who presents to the clinic 2 weeks status post I&D of Bartholins gland abscess   . Eating a regular diet without difficulty. Bowel movements are normal. The patient is not having any pain. Some itching   The following portions of the patient's history were reviewed and updated as appropriate: allergies, current medications, past family history, past medical history, past social history, past surgical history and problem list.  Review of Systems Pertinent items noted in HPI and remainder of comprehensive ROS otherwise negative.    Objective:    BP 102/70   Pulse 86   Wt 165 lb 3.2 oz (74.9 kg)   BMI 29.26 kg/m  General:  alert, cooperative and no distress     Incision:   healing well, no drainage, no erythema, no swelling, the Word catheter is in place     Assessment:    Doing well postoperatively. Operative findings again reviewed. Pathology report discussed.    Plan:    1. Continue any current medications. 2. Wound care discussed. 3. Activity restrictions: none 4. Anticipated return to work: not applicable. 5. Follow up: 2 weeks for catheter removal. Patient ID: Nicole Holloway, female   DOB: September 23, 1970, 49 y.o.   MRN: [id number]  10 minutes Woodroe Mode, MD 03/19/2020

## 2020-03-19 NOTE — Patient Instructions (Signed)
Quiste de [employer] Bartholin's Cyst  Un quiste de Bartolino es un saco lleno de lquido que se forma en una glndula de [place]. Las glndulas de Bartolino son pequeas glndulas que se encuentran en los pliegues de la piel cerca del orificio de la vagina (labios de la vulva). Este tipo de quiste forma un bulto o una hinchazn cerca del orificio de la vagina. Si tiene un quiste pequeo y no infectado, es posible que pueda tratarlo en su casa. Si el quiste se infecta, puede causar dolor y es posible que el mdico deba drenarlo. Cules son las causas? Esta afeccin puede producirse por una glndula de Bartolino obstruida. Microbios (bacterias) dentro del quiste que pueden causar una infeccin. Cules son los signos o sntomas?  Un bulto o una hinchazn cerca del orificio inferior de la vagina.  Molestias o dolor.  Enrojecimiento, hinchazn y secrecin de lquido en el rea. Cmo se trata? Es posible que no necesite tratamiento si el quiste no causa sntomas. El quiste puede desaparecer por s solo con Art therapist. El cuidado en el hogar incluye baos calientes o terapia de calor. Los quistes grandes o los quistes infectados pueden tratarse con:  Antibiticos.  Procedimiento para drenar el lquido. Los quistes recurrentes tendrn que drenarse muchas veces. El mdico puede hablar con usted sobre la ciruga para extirpar el quiste. Siga estas instrucciones en su casa: Medicamentos  Use los medicamentos de venta libre y los recetados solamente como se lo haya indicado el mdico.  Si le recetaron un antibitico, tmelo como se lo haya indicado el mdico. No deje de tomarlo aunque comience a sentirse mejor. Control del dolor y de la hinchazn  Pruebe con un bao de asiento para Therapist, occupational y la hinchazn. Un bao de asiento es un bao con agua tibia en el cual el agua solo le llega hasta la cadera y debe cubrir las nalgas. Puede tomar baos de asiento algunas veces al  [employer].  Si se lo indican, use calor en la zona afectada con la frecuencia que sea necesaria. Use la fuente de calor que el mdico le recomiende, como una compresa de calor hmedo o una almohadilla trmica. ? Coloque una [employer] piel y la fuente de Freight forwarder. ? Aplique calor durante 20 a 38minutos. ? Retire la fuente de calor si la piel se le pone de color rojo brillante. Esto es [employer]. Si no puede sentir dolor, calor o fro, tiene un mayor riesgo de [place]. Indicaciones generales  Si el quiste fue drenado: ? Siga las instrucciones del mdico en lo que respecta al cuidado de la herida. ? Utilice toallas femeninas para absorber el lquido.  No apriete ni haga presin [employer].  No tenga relaciones sexuales hasta que el quiste haya desaparecido o la herida del drenaje haya sanado.  Tome estas medidas para ayudar a Presenter, broadcasting de un quiste de Bartolino y que se formen otros quistes: ? Tome un bao o una ducha una [place]. Higienice la zona alrededor de la vagina con agua y un jabn suave cuando se bae. ? Practique el sexo seguro para prevenir las ITS. Hable con el mdico sobre cmo prevenir las infecciones de transmisin sexual (ITS) y qu mtodos anticonceptivos pueden ser mejores para usted.  Cumpla con todas las visitas de seguimiento. Comunquese con un mdico si:  Tiene fiebre.  Tiene ms enrojecimiento, hinchazn o dolor alrededor del quiste.  Tiene lquido, sangre, pus o mal olor que proviene  del quiste.  Tiene un quiste que [place] de tamao o un quiste que vuelve a formarse. Resumen  Un quiste de Bartolino es un saco lleno de lquido que se forma en una glndula de [place]. Estas pequeas glndulas se encuentran en los pliegues de la piel cerca del orificio de la vagina (labios de la vulva).  Este tipo de quiste forma un bulto o una hinchazn cerca del orificio de la vagina.  Pruebe con baos de asiento algunas veces al da para ayudar a  Therapist, occupational y la hinchazn.  No apriete ni haga presin [employer]. Esta informacin no tiene Marine scientist el consejo del mdico. Asegrese de hacerle al mdico cualquier pregunta que tenga. Document Revised: 07/26/2019 Document Reviewed: 07/26/2019 Elsevier Patient Education  [place].

## 2020-03-19 NOTE — Progress Notes (Signed)
[facility] AMN healthcare interpreter [phone]

## 2020-04-02 ENCOUNTER — Ambulatory Visit: Payer: Self-pay | Admitting: Internal Medicine

## 2020-04-07 ENCOUNTER — Ambulatory Visit (INDEPENDENT_AMBULATORY_CARE_PROVIDER_SITE_OTHER): Payer: Self-pay | Admitting: Obstetrics and Gynecology

## 2020-04-07 ENCOUNTER — Other Ambulatory Visit: Payer: Self-pay

## 2020-04-07 ENCOUNTER — Encounter: Payer: Self-pay | Admitting: Obstetrics and Gynecology

## 2020-04-07 VITALS — BP 98/47 | HR 79 | Ht 60.0 in | Wt 167.0 lb

## 2020-04-07 DIAGNOSIS — N751 Abscess of Bartholin's gland: Secondary | ICD-10-CM

## 2020-04-07 NOTE — Progress Notes (Signed)
   History:  [name] is a 50 y.o. [id number] who presents to clinic today for removal of word catheter.   Placed 2/16 after I&D of bartholin gland on right side. Completed course of antibiotics. Doing well overall. Reports that catheter is no longer draining. Denies pain at area. No fevers or chills. Normal voiding and bowel movements.    The following portions of the patient's history were reviewed and updated as appropriate: allergies, current medications, family history, past medical history, social history, past surgical history and problem list.  Review of Systems:  Review of Systems  Constitutional: Negative for chills and fever.  Gastrointestinal: Negative for constipation.  Genitourinary: Negative for dysuria.  Skin: Negative for rash.      Objective:  Physical Exam BP (!) 98/47   Pulse 79   Ht 5' (1.524 m)   Wt 167 lb (75.8 kg)   BMI 32.61 kg/m  Physical Exam Constitutional:      Appearance: Normal appearance.  Genitourinary:    Comments: Drain in place on right side with no output. Removed with ease. Area of incision is healing well without erythema, drainage, or swelling. It is nontender to palpation.  Skin:    General: Skin is warm and dry.     Findings: No erythema or rash.  Neurological:     Mental Status: She is alert.       Labs and Imaging No results found for this or any previous visit (from the past 24 hour(s)).  No results found.   Assessment & Plan:  1. Bartholin's gland abscess -catheter removed without issue, healing appropriately. Advised on return precautions.     Gita Kudo, MD 04/07/2020 2:51 PM    Casper Harrison, MD [facility] Family Medicine Fellow, [facility] for [facility], [facility]

## 2020-04-15 ENCOUNTER — Encounter: Payer: Self-pay | Admitting: General Practice

## 2020-06-10 ENCOUNTER — Other Ambulatory Visit: Payer: Self-pay | Admitting: Hematology and Oncology

## 2020-06-10 ENCOUNTER — Other Ambulatory Visit (HOSPITAL_COMMUNITY): Payer: Self-pay

## 2020-06-11 ENCOUNTER — Other Ambulatory Visit (HOSPITAL_COMMUNITY): Payer: Self-pay

## 2020-06-11 MED ORDER — TAMOXIFEN CITRATE 20 MG PO TABS
ORAL_TABLET | Freq: Every day | ORAL | 3 refills | Status: DC
  Filled 2020-06-11: qty 90, 90d supply, fill #0
  Filled 2020-10-30: qty 90, 90d supply, fill #1
  Filled 2021-02-08: qty 90, 90d supply, fill #2
  Filled 2021-04-30: qty 90, 90d supply, fill #3

## 2020-07-03 ENCOUNTER — Other Ambulatory Visit: Payer: Self-pay | Admitting: Obstetrics and Gynecology

## 2020-07-03 DIAGNOSIS — Z1231 Encounter for screening mammogram for malignant neoplasm of breast: Secondary | ICD-10-CM

## 2020-07-06 ENCOUNTER — Other Ambulatory Visit (INDEPENDENT_AMBULATORY_CARE_PROVIDER_SITE_OTHER): Payer: Self-pay | Admitting: Internal Medicine

## 2020-07-06 DIAGNOSIS — Z1211 Encounter for screening for malignant neoplasm of colon: Secondary | ICD-10-CM

## 2020-07-06 LAB — POC HEMOCCULT BLD/STL (HOME/3-CARD/SCREEN)
Card #2 Fecal Occult Blod, POC: NEGATIVE
Card #3 Fecal Occult Blood, POC: NEGATIVE
Fecal Occult Blood, POC: NEGATIVE

## 2020-07-06 NOTE — Addendum Note (Signed)
Addended by: Mariah Milling on: 07/06/2020 03:09 PM   Modules accepted: Orders

## 2020-07-09 ENCOUNTER — Encounter: Payer: Self-pay | Admitting: Internal Medicine

## 2020-07-09 ENCOUNTER — Ambulatory Visit: Payer: Self-pay | Admitting: Internal Medicine

## 2020-07-09 ENCOUNTER — Other Ambulatory Visit: Payer: Self-pay

## 2020-07-09 VITALS — BP 112/80 | HR 64 | Resp 12 | Ht 61.5 in | Wt 163.0 lb

## 2020-07-09 DIAGNOSIS — N898 Other specified noninflammatory disorders of vagina: Secondary | ICD-10-CM

## 2020-07-09 DIAGNOSIS — Z8639 Personal history of other endocrine, nutritional and metabolic disease: Secondary | ICD-10-CM

## 2020-07-09 DIAGNOSIS — N939 Abnormal uterine and vaginal bleeding, unspecified: Secondary | ICD-10-CM

## 2020-07-09 DIAGNOSIS — E782 Mixed hyperlipidemia: Secondary | ICD-10-CM

## 2020-07-09 DIAGNOSIS — Z0000 Encounter for general adult medical examination without abnormal findings: Secondary | ICD-10-CM

## 2020-07-09 LAB — POCT WET PREP WITH KOH
Clue Cells Wet Prep HPF POC: NEGATIVE
KOH Prep POC: NEGATIVE
Trichomonas, UA: NEGATIVE
Yeast Wet Prep HPF POC: NEGATIVE

## 2020-07-09 NOTE — Addendum Note (Signed)
Addended by: Marcelino Duster on: 07/09/2020 11:14 AM   Modules accepted: Orders

## 2020-07-09 NOTE — Progress Notes (Signed)
Hemoccult cancelled as was not patient's specimen, but her husband's

## 2020-07-09 NOTE — Progress Notes (Signed)
Subjective:    Patient ID: Nicole Holloway, female   DOB: 1970-05-14, 50 y.o.   MRN: [id number]   HPI  Interpreted by Basilio Cairo  CPE with pap  1.  Pap:  Last 07/04/2019 and normal.    2.  Mammogram:  Personal history of breast cancer 2017.  Lumpectomy with radiation and continues on Tamoxifen.  Will be 5 years for Tamoxifen in September.  Reportedly being recommended by Oncology for 5 more years.  3.  Osteoprevention:  Drinks almond milk once daily.  Not taking any calcium or Vitamin D.  Has never had a bone density.  Walks or goes to gym generally 4 days weekly.    4.  Guaiac Cards:  Last checked 07/06/20 and negative.    5.  Colonoscopy:  07/04/2017 for rectal bleeding.  Normal.  No family history of colon cancer.  6.  Immunizations:  Did [place] booster [place], but does not recall exactly.  Perhaps in January of 2022.  Immunization History  Administered Date(s) Administered   Influenza Inj Mdck Quad Pf 12/01/2016   Influenza,inj,Quad PF,6+ Mos 02/25/2015, 10/29/2015, 10/19/2017, 10/29/2019   Influenza-Unspecified 12/09/2015   PFIZER(Purple Top)SARS-COV-2 Vaccination 03/30/2019, 04/23/2019   Tdap 02/25/2016     7.  Glucose/Cholesterol:  A1C last year 5.3% checked for elevated glucose.  Dyslipidemia.   Lipid Panel     Component Value Date/Time   CHOL 162 07/04/2019 1019   TRIG 171 (H) 07/04/2019 1019   HDL 37 (L) 07/04/2019 1019   CHOLHDL 3.5 Ratio 03/05/2008 2050   VLDL 14 03/05/2008 2050   LDLCALC 95 07/04/2019 1019   LABVLDL 30 07/04/2019 1019     Current Meds  Medication Sig   cetirizine (ZYRTEC) 10 MG tablet Take 10 mg by mouth daily.   tamoxifen (NOLVADEX) 20 MG tablet TAKE 1 TABLET BY MOUTH ONCE A DAY   No Known Allergies  Past Medical History:  Diagnosis Date   Abnormal mammogram with microcalcification 07/03/2014   Left Breast calcifications   Breast cancer ([place]) 04/2015   Upper outer quadrant, right breast  Estrogen Receptor +: surgery, radiation, Tamoxifen   Constipation    External hemorrhoids    Hepatic steatosis 09/05/2014   With elevated liver enzymes   History of radiation therapy 08/03/15- 08/28/15   Right Breast   Hyperglycemia 06/09/2014   101 fasting;  repeat 95   Hyperlipidemia 06/04/2014   Obesity 12/01/2016   Seasonal allergies    Varicose veins of both lower extremities     Past Surgical History:  Procedure Laterality Date   BREAST BIOPSY Right 05/14/2015   U/S Core- Malignant   BREAST BIOPSY Left 05/14/2015   Stereo- Benign   BREAST LUMPECTOMY Right 06/16/2015   BREAST LUMPECTOMY WITH RADIOACTIVE SEED AND SENTINEL LYMPH NODE BIOPSY Right 06/16/2015   Procedure: BREAST LUMPECTOMY WITH RADIOACTIVE SEED AND SENTINEL LYMPH NODE BIOPSY;  Surgeon: Erroll Luna, MD;  Location: [facility];  Service: General;  Laterality: Right;   CESAREAN SECTION  1998   Family History  Problem Relation Age of Onset   Diabetes Mother    Other Mother        osteoarthritis, knee replacement   Stroke Father    Diabetes Father    Other Father        prostate issues   Kidney failure Father 3       started dialysis march 2nd   Heart disease Father  Heart failure was cause of death.   Eczema Daughter    Mental illness Son 83       Bipolar vs Schizophrenia   Other Maternal Aunt        hx benign breast lump   ADD / ADHD Paternal Uncle    Kidney failure Maternal Grandmother        d. 61s; diabetes   Diabetes Maternal Grandmother    Social History   Socioeconomic History   Marital status: Married    Spouse name: Francisco   Number of children: 2   Years of education: Not on file   Highest education level: 9th grade  Occupational History   Occupation: housecleaning  Tobacco Use   Smoking status: Never   Smokeless tobacco: Never  Vaping Use   Vaping Use: Never used  Substance and Sexual Activity   Alcohol use: No   Drug use: No   Sexual activity: Yes     Birth control/protection: None  Other Topics Concern   Not on file  Social History Narrative   Lives at home with husband and two kids.     Son, Elita Quick, now with better control of his health issues   Social Determinants of Health   Financial Resource Strain: Low Risk    Difficulty of Paying Living Expenses: Not hard at all  Food Insecurity: No Food Insecurity   Worried About Charity fundraiser in the Last Year: Never true   [place] in the Last Year: Never true  Transportation Needs: No Transportation Needs   Lack of Transportation (Medical): No   Lack of Transportation (Non-Medical): No  Physical Activity: Not on file  Stress: Not on file  Social Connections: Not on file  Intimate Partner Violence: Not At Risk   Fear of Current or Ex-Partner: No   Emotionally Abused: No   Physically Abused: No   Sexually Abused: No                                                                                                                                                                             Review of Systems  HENT:  Positive for sore throat (For 1 week.).   Respiratory:  Positive for cough (For 1 week.). Negative for shortness of breath.   Cardiovascular:  Positive for leg swelling (occasionally.). Negative for chest pain.  Gastrointestinal:  Negative for abdominal pain.  Genitourinary:        Started with what seems to be dark blood on the 5th of June.  Had not had a period prior for about 1 year. Taking Tamoxifen now for almost 5 years for right breast cancer.   Did have breast tenderness and  pelvic aching.       Objective:   BP 112/80 (BP Location: Left Arm, Patient Position: Sitting, Cuff Size: Normal)   Pulse 64   Resp 12   Ht 5' 1.5" (1.562 m)   Wt 163 lb (73.9 kg)   LMP 06/21/2020 Comment: coffee colored vaginal discharge.  Had not had a period for a year prior.  With breast tenderness and cramping.  BMI 30.30 kg/m   Physical Exam Constitutional:       Appearance: She is well-developed.  HENT:     Head: Normocephalic and atraumatic.     Right Ear: Hearing, tympanic membrane, ear canal and external ear normal.     Left Ear: Hearing, tympanic membrane, ear canal and external ear normal.     Nose: Nose normal.     Mouth/Throat:     Mouth: Mucous membranes are moist.     Pharynx: Oropharynx is clear.  Eyes:     Conjunctiva/sclera: Conjunctivae normal.     Comments: Discs sharp bilaterally.  Neck:     Thyroid: No thyroid mass or thyromegaly.  Cardiovascular:     Rate and Rhythm: Normal rate and regular rhythm.     Heart sounds: S1 normal and S2 normal. No murmur heard.   No friction rub. No S3 or S4 sounds.     Comments: No carotid bruits.  Carotid, radial, femoral, DP and PT pulses normal and equal.    Varicosities of LE bilaterally. Pulmonary:     Effort: Pulmonary effort is normal.     Breath sounds: Normal breath sounds.  Chest:  Breasts:    Right: No mass, nipple discharge, skin change, tenderness, axillary adenopathy or supraclavicular adenopathy.     Left: No nipple discharge, skin change, tenderness, axillary adenopathy or supraclavicular adenopathy.  Abdominal:     General: Bowel sounds are normal.     Palpations: Abdomen is soft. There is no hepatomegaly or splenomegaly.     Tenderness: There is no abdominal tenderness.     Hernia: There is no hernia in the left inguinal area or right inguinal area.  Genitourinary:    General: Normal vulva.     Vagina: Vaginal discharge (moderate thick yellow.  No odor.) present. No erythema.     Cervix: No cervical motion tenderness, lesion or erythema.     Uterus: Tender. Not enlarged.      Adnexa:        Right: No mass or tenderness.         Left: No mass or tenderness.    Musculoskeletal:        General: Normal range of motion.     Cervical back: Normal range of motion and neck supple.     Right lower leg: No edema.     Left lower leg: No edema.  Lymphadenopathy:     Head:      Right side of head: No submental or submandibular adenopathy.     Left side of head: No submental or submandibular adenopathy.     Cervical: No cervical adenopathy.     Upper Body:     Right upper body: No supraclavicular or axillary adenopathy.     Left upper body: No supraclavicular or axillary adenopathy.     Lower Body: No right inguinal adenopathy. No left inguinal adenopathy.  Skin:    General: Skin is warm.     Findings: No rash.  Neurological:     General: No focal deficit present.     Mental Status:  She is alert.     Cranial Nerves: Cranial nerves are intact.     Sensory: Sensation is intact.     Motor: Motor function is intact.     Coordination: Coordination is intact.     Gait: Gait is intact.     Deep Tendon Reflexes: Reflexes are normal and symmetric.  Psychiatric:        Attention and Perception: Attention normal.        Mood and Affect: Mood normal.        Speech: Speech normal.        Behavior: Behavior normal. Behavior is cooperative.     Assessment & Plan   CPE:  FIT test for stool, to return in 2 weeks. CBC, CMP, FLP  2.  Hyperlipidemia/dyslipidema:  FLP  3.  Vaginal bleeding on Tamoxifen for 5 years and no period for 1 year.  CBC, Pelvic U/S.  4.  Vaginal Discharge:  may be left over from uterine bleeding.  No obvious findings on wet prep.  Sending GC/chlamydia.  Addendum:  unable to draw CBC today--will return in 2-4 weeks to obtain.

## 2020-07-10 LAB — COMPREHENSIVE METABOLIC PANEL
ALT: 36 IU/L — ABNORMAL HIGH (ref 0–32)
AST: 26 IU/L (ref 0–40)
Albumin/Globulin Ratio: 1.9 (ref 1.2–2.2)
Albumin: 4.6 g/dL (ref 3.8–4.8)
Alkaline Phosphatase: 78 IU/L (ref 44–121)
BUN/Creatinine Ratio: 17 (ref 9–23)
BUN: 11 mg/dL (ref 6–24)
Bilirubin Total: <0.2 mg/dL (ref 0.0–1.2)
CO2: 22 mmol/L (ref 20–29)
Calcium: 8.9 mg/dL (ref 8.7–10.2)
Chloride: 104 mmol/L (ref 96–106)
Creatinine, Ser: 0.66 mg/dL (ref 0.57–1.00)
Globulin, Total: 2.4 g/dL (ref 1.5–4.5)
Glucose: 95 mg/dL (ref 65–99)
Potassium: 3.8 mmol/L (ref 3.5–5.2)
Sodium: 140 mmol/L (ref 134–144)
Total Protein: 7 g/dL (ref 6.0–8.5)
eGFR: 107 mL/min/{1.73_m2} (ref >59)

## 2020-07-10 LAB — LIPID PANEL W/O CHOL/HDL RATIO
Cholesterol, Total: 171 mg/dL (ref 100–199)
HDL: 37 mg/dL — ABNORMAL LOW (ref >39)
LDL Chol Calc (NIH): 110 mg/dL — ABNORMAL HIGH (ref 0–99)
Triglycerides: 135 mg/dL (ref 0–149)
VLDL Cholesterol Cal: 24 mg/dL (ref 5–40)

## 2020-07-12 LAB — GC/CHLAMYDIA PROBE AMP
Chlamydia trachomatis, NAA: NEGATIVE
Neisseria Gonorrhoeae by PCR: NEGATIVE

## 2020-08-10 ENCOUNTER — Other Ambulatory Visit (INDEPENDENT_AMBULATORY_CARE_PROVIDER_SITE_OTHER): Payer: Self-pay

## 2020-08-10 ENCOUNTER — Other Ambulatory Visit: Payer: Self-pay

## 2020-08-10 DIAGNOSIS — N939 Abnormal uterine and vaginal bleeding, unspecified: Secondary | ICD-10-CM

## 2020-08-11 LAB — CBC WITH DIFFERENTIAL/PLATELET
Basophils Absolute: 0 10*3/uL (ref 0.0–0.2)
Basos: 0 %
EOS (ABSOLUTE): 0.1 10*3/uL (ref 0.0–0.4)
Eos: 2 %
Hematocrit: 37.7 % (ref 34.0–46.6)
Hemoglobin: 12.7 g/dL (ref 11.1–15.9)
Immature Grans (Abs): 0 10*3/uL (ref 0.0–0.1)
Immature Granulocytes: 0 %
Lymphocytes Absolute: 2.4 10*3/uL (ref 0.7–3.1)
Lymphs: 35 %
MCH: 30.6 pg (ref 26.6–33.0)
MCHC: 33.7 g/dL (ref 31.5–35.7)
MCV: 91 fL (ref 79–97)
Monocytes Absolute: 0.6 10*3/uL (ref 0.1–0.9)
Monocytes: 9 %
Neutrophils Absolute: 3.7 10*3/uL (ref 1.4–7.0)
Neutrophils: 54 %
Platelets: 195 10*3/uL (ref 150–450)
RBC: 4.15 x10E6/uL (ref 3.77–5.28)
RDW: 13 % (ref 11.7–15.4)
WBC: 6.9 10*3/uL (ref 3.4–10.8)

## 2020-08-17 ENCOUNTER — Other Ambulatory Visit (INDEPENDENT_AMBULATORY_CARE_PROVIDER_SITE_OTHER): Payer: Self-pay

## 2020-08-17 DIAGNOSIS — Z1211 Encounter for screening for malignant neoplasm of colon: Secondary | ICD-10-CM

## 2020-08-17 LAB — POC FIT TEST STOOL: Fecal Occult Blood: NEGATIVE

## 2020-08-18 ENCOUNTER — Other Ambulatory Visit: Payer: Self-pay

## 2020-08-18 ENCOUNTER — Telehealth: Payer: Self-pay

## 2020-08-18 ENCOUNTER — Ambulatory Visit: Payer: Self-pay | Admitting: *Deleted

## 2020-08-18 VITALS — BP 114/72 | Wt 163.7 lb

## 2020-08-18 DIAGNOSIS — C50411 Malignant neoplasm of upper-outer quadrant of right female breast: Secondary | ICD-10-CM

## 2020-08-18 DIAGNOSIS — Z1239 Encounter for other screening for malignant neoplasm of breast: Secondary | ICD-10-CM

## 2020-08-18 NOTE — Progress Notes (Signed)
Ms. Javionna Burch is a 50 y.o. female who presents to [facility] clinic today with no complaints.    Pap Smear: Pap smear not completed today. Last Pap smear was 07/04/2019 at [facility] clinic and was normal. HPV testing was not completed at that time. Per patient has no history of an abnormal Pap smear. Last Pap smear result is available in Epic.   Physical exam: Breasts Left breast slightly larger than right breast due to history of right breast lumpectomy 06/16/2015. No skin abnormalities left breast. Scar observed on right upper outer breast from lumpectomy and skin darkened right breast due to history of radiation. No nipple retraction bilateral breasts. No nipple discharge bilateral breasts. No lymphadenopathy. No lumps palpated bilateral breasts. No complaints of pain or tenderness on exam.   MM DIAG BREAST TOMO BILATERAL  Result Date: 07/13/2017 CLINICAL DATA:  History of RIGHT breast cancer in 2017 status post breast conservation surgery. EXAM: DIGITAL DIAGNOSTIC BILATERAL MAMMOGRAM WITH CAD AND TOMO COMPARISON:  Previous exam(s). ACR Breast Density Category b: There are scattered areas of fibroglandular density. FINDINGS: There are expected postsurgical changes within the RIGHT breast. There are no new dominant masses, suspicious calcifications or secondary signs of malignancy within either breast. Mammographic images were processed with CAD. IMPRESSION: No evidence of malignancy within either breast. Postsurgical changes of the RIGHT breast. RECOMMENDATION: Bilateral diagnostic mammogram in 1 year. I have discussed the findings and recommendations with the patient. Results were also provided in writing at the conclusion of the visit. If applicable, a reminder letter will be sent to the patient regarding the next appointment. BI-RADS CATEGORY  2: Benign. Electronically Signed   By: Franki Cabot M.D.   On: 07/13/2017 11:58   MM DIAG BREAST TOMO BILATERAL  Result Date: 05/19/2016 CLINICAL  DATA:  50 year old female with history of right lumpectomy in May 2017. EXAM: 2D DIGITAL DIAGNOSTIC BILATERAL MAMMOGRAM WITH CAD AND ADJUNCT TOMO COMPARISON:  Previous exam(s). ACR Breast Density Category c: The breast tissue is heterogeneously dense, which may obscure small masses. FINDINGS: Postlumpectomy changes are seen within the superior, central right breast. No suspicious mass, calcifications, or other abnormality is identified within either breast. Mammographic images were processed with CAD. IMPRESSION: No mammographic evidence of malignancy. RECOMMENDATION: Bilateral diagnostic mammogram in 1 year. I have discussed the findings and recommendations with the patient through the use of an interpreter. Results were also provided in writing at the conclusion of the visit. If applicable, a reminder letter will be sent to the patient regarding the next appointment. BI-RADS CATEGORY  2: Benign. Electronically Signed   By: Pamelia Hoit M.D.   On: 05/19/2016 10:54   MS DIGITAL DIAG TOMO BILAT  Result Date: 08/02/2019 CLINICAL DATA:  Status post right lumpectomy and radiation therapy for breast cancer in 2017. She also had a benign left breast biopsy in 2017. EXAM: DIGITAL DIAGNOSTIC BILATERAL MAMMOGRAM WITH TOMO AND CAD COMPARISON:  Previous exam(s). ACR Breast Density Category c: The breast tissue is heterogeneously dense, which may obscure small masses. FINDINGS: Stable post lumpectomy changes on the right. No interval findings suspicious for malignancy in either breast. Mammographic images were processed with CAD. IMPRESSION: No evidence of malignancy. RECOMMENDATION: Bilateral diagnostic mammogram in 1 year. I have discussed the findings and recommendations with the patient. If applicable, a reminder letter will be sent to the patient regarding the next appointment. BI-RADS CATEGORY  2: Benign. Electronically Signed   By: Claudie Revering M.D.   On: 08/02/2019 11:07  MS DIGITAL DIAG TOMO BILAT  Result Date:  07/31/2018 CLINICAL DATA:  50 year old female presenting for routine annual surveillance status post right breast lumpectomy in 2017. EXAM: DIGITAL DIAGNOSTIC BILATERAL MAMMOGRAM WITH CAD AND TOMO COMPARISON:  Previous exam(s). ACR Breast Density Category c: The breast tissue is heterogeneously dense, which may obscure small masses. FINDINGS: There are developing dystrophic calcifications the at the surgical site, compatible with surgical change. The lumpectomy site in the upper-outer quadrant of the right breast is otherwise stable. No suspicious calcifications, masses or areas of distortion are seen in the bilateral breasts. Mammographic images were processed with CAD. IMPRESSION: Stable right breast lumpectomy site. No mammographic evidence of malignancy in the bilateral breasts. RECOMMENDATION: Diagnostic mammogram is suggested in 1 year. (Code:[id number]-B-01Y) I have discussed the findings and recommendations with the patient. Results were also provided in writing at the conclusion of the visit. If applicable, a reminder letter will be sent to the patient regarding the next appointment. BI-RADS CATEGORY  2: Benign. Electronically Signed   By: Ammie Ferrier M.D.   On: 07/31/2018 15:17    Pelvic/Bimanual Pap is not indicated today per BCCCP guidelines.   Smoking History: Patient has never smoked.   Patient Navigation: Patient education provided. Access to services provided for patient through [employer] program. Spanish interpreter Rudene Anda from [facility] provided.   Colorectal Cancer Screening: Per patient has had colonoscopy completed on 07/04/2017 at [facility]. No complaints today.    Breast and Cervical Cancer Risk Assessment: Patient does not have family history of breast cancer, known genetic mutations, or radiation treatment to the chest before age 59. Patient does not have history of cervical dysplasia, immunocompromised, or DES exposure in-utero.  Risk Assessment     Risk Scores        08/18/2020 07/23/2019   Last edited by: Demetrius Revel, LPN McGill, Sherie Mamie Nick, LPN   5-year risk: 0.7 % 0.8 %   Lifetime risk: 6.8 % 7 %            A: BCCCP exam without pap smear No complaints.  P: Referred patient to the [facility] for a diagnostic mammogram per recommendation. Appointment scheduled Tuesday, September 01, 2020 at [facility].  Loletta Parish, RN 08/18/2020 11:01 AM

## 2020-08-18 NOTE — Telephone Encounter (Signed)
Pt called to report issue during urination that started yesterday. Pt described having a sensation that she still needs to urinate after already going to the restroom multiple times. Sensation does not go away. Pt report that there is no other symptoms. Is currently not taking any medications for this issue. Would like an appointment or recommendation.

## 2020-08-18 NOTE — Telephone Encounter (Signed)
If we don't have an opening tomorrow, have her come in and give [place] a urine sample and we can treat from there.

## 2020-08-18 NOTE — Patient Instructions (Signed)
Explained breast self awareness with Letta Kocher. Patient did not need a Pap smear today due to last Pap smear was 07/04/2019. Let her know BCCCP will cover Pap smears every 3 years unless has a history of abnormal Pap smears. Referred patient to the [facility] for a diagnostic mammogram per recommendation. Appointment scheduled Tuesday, September 01, 2020 at [facility]. Patient aware of appointment and will be there. [place] verbalized understanding.  Catlynn Grondahl, Arvil Chaco, RN 11:01 AM

## 2020-08-19 ENCOUNTER — Other Ambulatory Visit (INDEPENDENT_AMBULATORY_CARE_PROVIDER_SITE_OTHER): Payer: Self-pay | Admitting: Internal Medicine

## 2020-08-19 DIAGNOSIS — R319 Hematuria, unspecified: Secondary | ICD-10-CM

## 2020-08-19 LAB — POCT URINALYSIS DIPSTICK
Bilirubin, UA: NEGATIVE
Glucose, UA: NEGATIVE
Ketones, UA: NEGATIVE
Nitrite, UA: NEGATIVE
Protein, UA: POSITIVE — AB
Spec Grav, UA: 1.02 (ref 1.010–1.025)
Urobilinogen, UA: 0.2 E.U./dL
pH, UA: 6 (ref 5.0–8.0)

## 2020-08-19 MED ORDER — CIPROFLOXACIN HCL 500 MG PO TABS: ORAL_TABLET | ORAL | 0 refills | Status: DC

## 2020-08-19 NOTE — Telephone Encounter (Signed)
We did not have any available openings. Pt will come by to leave a urine sample

## 2020-08-19 NOTE — Progress Notes (Signed)
Called and let patient know prescription for Cipro sent to [facility] on [address]

## 2020-08-24 LAB — URINE CULTURE

## 2020-08-25 NOTE — Progress Notes (Signed)
Pt. Informed  of results

## 2020-08-25 NOTE — Progress Notes (Signed)
Pt. States UTI is resolved and that she felt better after taking the first pill. Pt. finished with the antibiotics on 08/22/20

## 2020-09-01 ENCOUNTER — Other Ambulatory Visit: Payer: Self-pay

## 2020-09-01 ENCOUNTER — Ambulatory Visit: Payer: Self-pay

## 2020-09-01 ENCOUNTER — Ambulatory Visit
Admission: RE | Admit: 2020-09-01 | Discharge: 2020-09-01 | Disposition: A | Discharge status: Home / Self Care | Payer: No Typology Code available for payment source | Source: Ambulatory Visit | Attending: Obstetrics and Gynecology | Admitting: Obstetrics and Gynecology

## 2020-09-01 DIAGNOSIS — Z170 Estrogen receptor positive status [ER+]: Secondary | ICD-10-CM

## 2020-10-28 ENCOUNTER — Telehealth: Payer: Self-pay

## 2020-10-28 NOTE — Telephone Encounter (Signed)
PT called to ask for an Rx to help with what she believes is an infection. Pt reported being itch and having bad odor in her genital area. She had no pain when urinating. She is not currently using a medication for this issue. Has had this issue since Monday.

## 2020-10-30 ENCOUNTER — Other Ambulatory Visit (INDEPENDENT_AMBULATORY_CARE_PROVIDER_SITE_OTHER): Payer: Self-pay

## 2020-10-30 ENCOUNTER — Other Ambulatory Visit (HOSPITAL_COMMUNITY): Payer: Self-pay

## 2020-10-30 ENCOUNTER — Other Ambulatory Visit: Payer: Self-pay

## 2020-10-30 DIAGNOSIS — R300 Dysuria: Secondary | ICD-10-CM

## 2020-10-30 DIAGNOSIS — R8290 Unspecified abnormal findings in urine: Secondary | ICD-10-CM

## 2020-10-30 LAB — POCT URINALYSIS DIPSTICK
Bilirubin, UA: NEGATIVE
Glucose, UA: NEGATIVE
Ketones, UA: NEGATIVE
Nitrite, UA: POSITIVE
Protein, UA: NEGATIVE
Spec Grav, UA: 1.02 (ref 1.010–1.025)
Urobilinogen, UA: 0.2 E.U./dL
pH, UA: 6 (ref 5.0–8.0)

## 2020-10-30 MED ORDER — NITROFURANTOIN MONOHYD MACRO 100 MG PO CAPS: ORAL_CAPSULE | ORAL | 0 refills | Status: DC

## 2020-10-30 NOTE — Progress Notes (Signed)
Symptoms of odor, burning again with urination. UA abnormal with + leuks. Urine culture Macrobid for 7 days

## 2020-11-04 NOTE — Telephone Encounter (Signed)
Pt left urine sample. UA and urine culture done

## 2020-11-05 LAB — SPECIMEN STATUS REPORT

## 2020-11-08 LAB — URINE CULTURE

## 2020-12-16 ENCOUNTER — Other Ambulatory Visit: Payer: Self-pay

## 2020-12-16 DIAGNOSIS — R8290 Unspecified abnormal findings in urine: Secondary | ICD-10-CM

## 2020-12-16 LAB — POCT URINALYSIS DIPSTICK
Bilirubin, UA: NEGATIVE
Glucose, UA: NEGATIVE
Ketones, UA: NEGATIVE
Nitrite, UA: POSITIVE
Protein, UA: POSITIVE — AB
Spec Grav, UA: 1.015 (ref 1.010–1.025)
Urobilinogen, UA: 0.2 E.U./dL
pH, UA: 6 (ref 5.0–8.0)

## 2020-12-18 ENCOUNTER — Other Ambulatory Visit: Payer: Self-pay

## 2020-12-18 ENCOUNTER — Encounter: Payer: Self-pay | Admitting: Internal Medicine

## 2020-12-18 ENCOUNTER — Ambulatory Visit: Payer: Self-pay | Admitting: Internal Medicine

## 2020-12-18 VITALS — BP 108/74 | HR 68 | Resp 16 | Ht 61.5 in | Wt 164.0 lb

## 2020-12-18 DIAGNOSIS — H547 Unspecified visual loss: Secondary | ICD-10-CM

## 2020-12-18 DIAGNOSIS — K0889 Other specified disorders of teeth and supporting structures: Secondary | ICD-10-CM

## 2020-12-18 DIAGNOSIS — N898 Other specified noninflammatory disorders of vagina: Secondary | ICD-10-CM

## 2020-12-18 DIAGNOSIS — H5789 Other specified disorders of eye and adnexa: Secondary | ICD-10-CM

## 2020-12-18 LAB — POCT WET PREP WITH KOH
KOH Prep POC: NEGATIVE
RBC Wet Prep HPF POC: NEGATIVE
Trichomonas, UA: NEGATIVE

## 2020-12-18 MED ORDER — FLUCONAZOLE 150 MG PO TABS: ORAL_TABLET | ORAL | 0 refills | Status: DC

## 2020-12-18 MED ORDER — METRONIDAZOLE 500 MG PO TABS: ORAL_TABLET | ORAL | 0 refills | Status: DC

## 2020-12-18 NOTE — Progress Notes (Signed)
Subjective:    Patient ID: Nicole Holloway, female   DOB: 1970-05-05, 50 y.o.   MRN: [id number]   HPI  Interpreted by Karen Kays   Pyuria:  Symptoms started in August.  At the time, treated for urine odor, microscopic hematuria and dysuria.  Only with urinating and felt like on the inside.  She does state today that she also had irritation and itchiness in anterior vulvar area.  Does admit to vaginal discharge for a long time.  Always wears a pad (Always Dry Weave brand).  Discharge is watery most of time and some blood.  Has had this for 1 year. Was first treated with Cipro in August and symptoms were better after treatment.  Culture of urine was negative   In October, treated for similar symptoms with concern for UTI with Nitrofurantoin.  Ultimately grew out low count Corynebacterium.   We called earlier this week to follow up.  She complained of mild burning and itching, sense of lower abdominal bloating/inflammation.  UA shows pyuria again.  For some reason, culture has not been reported out and not clear when it will be from call to [facility]. She does not feel the vaginal discharge has worsened with antibiotic treatment.  She does feel it has a bad odor.   2.  Eye concerns:  Feels eyesight getting worse.  Burning of eyes and matter in corners of eyes in the morning in particular, but can suddenly just feel like hot peppers in eyes and waters profusely.     3.  Dental:  Left upper molar with pain.    Current Meds  Medication Sig   cetirizine (ZYRTEC) 10 MG tablet Take 10 mg by mouth as needed.   tamoxifen (NOLVADEX) 20 MG tablet TAKE 1 TABLET BY MOUTH ONCE A DAY   No Known Allergies   Review of Systems    Objective:   BP 108/74 (BP Location: Left Arm, Patient Position: Sitting, Cuff Size: Normal)   Pulse 68   Resp 16   Ht 5' 1.5" (1.562 m)   Wt 164 lb (74.4 kg)   BMI 30.49 kg/m   Physical Exam NAD HEENT:  PERRL, EOMI, conjunctivae without injection.  Pingecula  of nasal left eye. Molar of upper right jaw with small filling and what appears to be cavity Neck:  Supple, No adenopathy Chest:  CTA CV:  RRR without murmur or rub.  Radial and DP pulses normal and equal Abd:  S, + BS, mild bilateral lower abdominal tenderness without rebound or peritoneal signs.  NT in suprapubic area. GU:  No external inflammation.  Bartholin cyst of right labia, relatively small, no erythema, NT.  Watery to yellow cervical os discharge and moderated amount in proximal vaginal canal.  No CMT, but mild tenderness of uterus without enlargement.  Mild tenderness with palpation of adnexal areas.  No mass or enlargement of adnexae.  Wet prep with copious WBCs, clue cells, + whiff.  Rare budding yeast --difficult to assess with WBCs so prominent   Assessment & Plan   Vaginal Discharge/vaginitis:  Feel this is currently the primary problem and likely the cause of continued pyuria.  Appears to have BV and likely mild underlying yeast vaginitis as well.  Perhaps some element of hormonal change for the discharge as well with chronic use of Tamoxifen.   Metronidazole 500 mg twice daily for 7 days, followed by 3 day course of Fluconazole 150 mg daily.   To call if her symptoms are  not resolved.    Stop using panyliners. Cotton underwear and nothing in pant area at bedtime.  2.  Decreased visual acuity/eye irritation:  referral to optometry.  Recommended eye lubricating drops regularly.    3.  Dental pain:  referral to dental.

## 2020-12-22 LAB — GC/CHLAMYDIA PROBE AMP
Chlamydia trachomatis, NAA: NEGATIVE
Neisseria Gonorrhoeae by PCR: NEGATIVE

## 2020-12-24 LAB — URINE CULTURE

## 2021-02-08 ENCOUNTER — Other Ambulatory Visit (HOSPITAL_COMMUNITY): Payer: Self-pay

## 2021-04-30 ENCOUNTER — Other Ambulatory Visit (HOSPITAL_COMMUNITY): Payer: Self-pay

## 2021-05-03 ENCOUNTER — Other Ambulatory Visit: Payer: Self-pay

## 2021-05-03 DIAGNOSIS — R300 Dysuria: Secondary | ICD-10-CM

## 2021-05-03 DIAGNOSIS — R319 Hematuria, unspecified: Secondary | ICD-10-CM

## 2021-05-03 LAB — POCT URINALYSIS DIPSTICK
Bilirubin, UA: NEGATIVE
Glucose, UA: NEGATIVE
Ketones, UA: NEGATIVE
Nitrite, UA: NEGATIVE
Protein, UA: NEGATIVE
Spec Grav, UA: 1.015 (ref 1.010–1.025)
Urobilinogen, UA: 0.2 E.U./dL
pH, UA: 6 (ref 5.0–8.0)

## 2021-05-03 MED ORDER — SULFAMETHOXAZOLE-TRIMETHOPRIM 800-160 MG PO TABS: 1.0000 | ORAL_TABLET | Freq: Two times a day (BID) | ORAL | 0 refills | Status: DC

## 2021-05-03 NOTE — Progress Notes (Signed)
Patient came in to leave a urine sample after calling for UTI concerns. Since 04/30/21 patient has experienced pain when urinating. Has also noticed some blood when wiping after urinating. Patient has not taken any medication for this issue. ? ?After reporting to Dr Amil Amen, Bactrim DS has been sent to the pharmacy. Will await urine culture results. ?

## 2021-05-05 LAB — URINE CULTURE

## 2021-07-09 ENCOUNTER — Encounter: Payer: Self-pay | Admitting: Internal Medicine

## 2021-07-19 ENCOUNTER — Encounter: Payer: Self-pay | Admitting: Internal Medicine

## 2021-07-19 ENCOUNTER — Ambulatory Visit: Payer: Self-pay | Admitting: Internal Medicine

## 2021-07-19 VITALS — BP 106/68 | HR 64 | Resp 12 | Ht 61.25 in | Wt 167.0 lb

## 2021-07-19 DIAGNOSIS — I8393 Asymptomatic varicose veins of bilateral lower extremities: Secondary | ICD-10-CM

## 2021-07-19 DIAGNOSIS — Z23 Encounter for immunization: Secondary | ICD-10-CM

## 2021-07-19 DIAGNOSIS — Z1231 Encounter for screening mammogram for malignant neoplasm of breast: Secondary | ICD-10-CM

## 2021-07-19 DIAGNOSIS — H547 Unspecified visual loss: Secondary | ICD-10-CM

## 2021-07-19 DIAGNOSIS — Z0000 Encounter for general adult medical examination without abnormal findings: Secondary | ICD-10-CM

## 2021-07-19 DIAGNOSIS — Z170 Estrogen receptor positive status [ER+]: Secondary | ICD-10-CM

## 2021-07-19 NOTE — Progress Notes (Signed)
Subjective:    Patient ID: Nicole Holloway, female   DOB: 05/13/1970, 51 y.o.   MRN: [id number]   HPI  CPE without pap  1.  Pap:  Last performed 2021 and normal.    2.  Mammogram:  Last mammogram 09/01/2020 and without recurrence of CA.  History of right breast cancer.    3.  Osteoprevention:  Never had DEXA.  Tamoxifen extended for another 5 years by Dr Pamelia Hoit when seen in 2022 after first 5 years.  Looking in chart, has not seen Dr. Pamelia Hoit since 02/2019.    4.  Guaiac Cards/FIT:  Negative in 07/06/20 and 08/17/2020.    5.  Colonoscopy:  Normal in 2019.  Due again in 2029.  Performed for rectal bleeding with no abnormal findings.  6.  Immunizations:   Immunization History  Administered Date(s) Administered   Influenza Inj Mdck Quad Pf 12/01/2016   Influenza,inj,Quad PF,6+ Mos 02/25/2015, 10/29/2015, 10/19/2017, 10/29/2019   Influenza-Unspecified 12/09/2015, 11/17/2020   PFIZER(Purple Top)SARS-COV-2 Vaccination 03/30/2019, 04/23/2019   Tdap 02/25/2016        7.  Glucose/Cholesterol:  A1C performed in 2021 and 5.1% for mildly elevated glucose.  Does have dyslipidemia with low HDL in past.  LDL fine.  She is working on increasing physical activity with walking and jump rope.  Eating nuts, avocados, fish intake increased.    Lipid Panel     Component Value Date/Time   CHOL 171 07/09/2020 1024   TRIG 135 07/09/2020 1024   HDL 37 (L) 07/09/2020 1024   CHOLHDL 3.5 Ratio 03/05/2008 2050   VLDL 14 03/05/2008 2050   LDLCALC 110 (H) 07/09/2020 1024   LABVLDL 24 07/09/2020 1024     Current Meds  Medication Sig   Omega-3 Fatty Acids (OMEGA-3 FISH OIL PO) Take by mouth.   tamoxifen (NOLVADEX) 20 MG tablet TAKE 1 TABLET BY MOUTH ONCE A DAY   No Known Allergies  Past Medical History:  Diagnosis Date   Abnormal mammogram with microcalcification 07/03/2014   Left Breast calcifications   Breast cancer (HCC) 04/2015   Upper outer quadrant, right breast Estrogen Receptor +:  surgery, radiation, Tamoxifen   Constipation    External hemorrhoids    Hepatic steatosis 09/05/2014   With elevated liver enzymes   History of radiation therapy 08/03/15- 08/28/15   Right Breast   Hyperglycemia 06/09/2014   101 fasting;  repeat 95   Hyperlipidemia 06/04/2014   Obesity 12/01/2016   Personal history of radiation therapy 2017   right lumpectomy w/ radiation. no chemo.   Seasonal allergies    Varicose veins of both lower extremities    Past Surgical History:  Procedure Laterality Date   BREAST BIOPSY Right 05/14/2015   U/S Core- Malignant   BREAST BIOPSY Left 05/14/2015   Stereo- Benign   BREAST LUMPECTOMY Right 06/16/2015   BREAST LUMPECTOMY WITH RADIOACTIVE SEED AND SENTINEL LYMPH NODE BIOPSY Right 06/16/2015   Procedure: BREAST LUMPECTOMY WITH RADIOACTIVE SEED AND SENTINEL LYMPH NODE BIOPSY;  Surgeon: Harriette Bouillon, MD;  Location: [facility];  Service: General;  Laterality: Right;   CESAREAN SECTION  1998   Family History  Problem Relation Age of Onset   Diabetes Mother    Other Mother        osteoarthritis, knee replacement   Stroke Father    Diabetes Father    Other Father        prostate issues   Kidney failure Father 43  started dialysis march 2nd   Heart disease Father        Heart failure was cause of death.   Eczema Daughter    Mental illness Son 59       Bipolar vs Schizophrenia   Other Maternal Aunt        hx benign breast lump   ADD / ADHD Paternal Uncle    Kidney failure Maternal Grandmother        d. 23s; diabetes   Diabetes Maternal Grandmother    Rheum arthritis Paternal Grandmother    Scleroderma Paternal Grandmother        ultimate cause of death.   Social History   Socioeconomic History   Marital status: Married    Spouse name: Francisco   Number of children: 2   Years of education: Not on file   Highest education level: 9th grade  Occupational History   Occupation: housecleaning  Tobacco Use   Smoking  status: Never   Smokeless tobacco: Never  Vaping Use   Vaping status: Never Used  Substance and Sexual Activity   Alcohol use: No   Drug use: No   Sexual activity: Yes    Birth control/protection: None  Other Topics Concern   Not on file  Social History Narrative   Lives at home with husband and two kids.     Son, Lars Mage, now with better control of his health issues   Social Determinants of Health   Financial Resource Strain: Low Risk  (07/19/2021)   Overall Financial Resource Strain (CARDIA)    Difficulty of Paying Living Expenses: Not hard at all  Food Insecurity: No Food Insecurity (07/19/2021)   Hunger Vital Sign    Worried About Running Out of Food in the Last Year: Never true    Ran Out of Food in the Last Year: Never true  Transportation Needs: No Transportation Needs (07/19/2021)   PRAPARE - Administrator, Civil Service (Medical): No    Lack of Transportation (Non-Medical): No  Physical Activity: Unknown (07/02/2018)   Exercise Vital Sign    Days of Exercise per Week: Not on file    Minutes of Exercise per Session: 60 min  Stress: No Stress Concern Present (03/29/2017)   [employer] of Occupational Health - Occupational Stress Questionnaire    Feeling of Stress : Not at all  Social Connections: Unknown (05/31/2021)   Received from [facility]   Social Network    Social Network: Not on file  Intimate Partner Violence: Not At Risk (07/19/2021)   Humiliation, Afraid, Rape, and Kick questionnaire    Fear of Current or Ex-Partner: No    Emotionally Abused: No    Physically Abused: No    Sexually Abused: No      Review of Systems  HENT:  Positive for dental problem (Having work done).   Eyes:  Positive for visual disturbance (Wears glasses.  Would like another check before she moves to [place]).  Respiratory:  Negative for shortness of breath.   Cardiovascular:  Negative for chest pain and palpitations.  Gastrointestinal:  Negative for  abdominal pain.  Neurological:  Positive for numbness (At times in right hand.  Using a stress squeeze toy when occurs.). Negative for weakness.      Objective:   BP 106/68 (BP Location: Left Arm, Patient Position: Sitting, Cuff Size: Normal)   Pulse 64   Resp 12   Ht 5' 1.25" (1.556 m)   Wt 167 lb (75.8  kg)   BMI 31.30 kg/m   Physical Exam HENT:     Head: Normocephalic and atraumatic.     Right Ear: Tympanic membrane, ear canal and external ear normal.     Left Ear: Tympanic membrane, ear canal and external ear normal.     Nose: Nose normal.     Mouth/Throat:     Mouth: Mucous membranes are moist.     Pharynx: Oropharynx is clear.  Eyes:     Extraocular Movements: Extraocular movements intact.     Conjunctiva/sclera: Conjunctivae normal.     Pupils: Pupils are equal, round, and reactive to light.     Comments: Discs sharp  Neck:     Thyroid: No thyroid mass or thyromegaly.  Cardiovascular:     Rate and Rhythm: Normal rate and regular rhythm.     Heart sounds: S1 normal and S2 normal. No murmur heard.    No friction rub. No S3 or S4 sounds.     Comments: No carotid bruits.  Carotid, radial, femoral, DP and PT pulses normal and equal.  Varicosities LE bilaterally. Pulmonary:     Effort: Pulmonary effort is normal.     Breath sounds: Normal breath sounds and air entry.  Chest:  Breasts:    Right: No inverted nipple, mass or nipple discharge.     Left: No inverted nipple, mass or nipple discharge.     Comments: Surgical scar changes upper outer right breast. Abdominal:     General: Bowel sounds are normal.     Palpations: Abdomen is soft. There is no hepatomegaly, splenomegaly or mass.     Tenderness: There is no abdominal tenderness.     Hernia: No hernia is present.  Genitourinary:    Comments: Normal external female genitalia. No uterine or adnexal mass or tenderness. Musculoskeletal:        General: Normal range of motion.     Cervical back: Normal range of  motion and neck supple.     Right lower leg: No edema.     Left lower leg: No edema.  Lymphadenopathy:     Head:     Right side of head: No submental or submandibular adenopathy.     Left side of head: No submental or submandibular adenopathy.     Cervical: No cervical adenopathy.     Upper Body:     Right upper body: No supraclavicular or axillary adenopathy.     Left upper body: No supraclavicular or axillary adenopathy.     Lower Body: No right inguinal adenopathy. No left inguinal adenopathy.  Skin:    General: Skin is warm.     Capillary Refill: Capillary refill takes less than 2 seconds.     Findings: No rash.     Comments: Dry papular lesions upper outer arms  Neurological:     General: No focal deficit present.     Mental Status: She is alert and oriented to person, place, and time.     Cranial Nerves: Cranial nerves 2-12 are intact.     Sensory: Sensation is intact.     Motor: Motor function is intact.     Coordination: Coordination is intact.     Gait: Gait is intact.     Deep Tendon Reflexes: Reflexes are normal and symmetric.  Psychiatric:        Speech: Speech normal.        Behavior: Behavior normal. Behavior is cooperative.      Assessment & Plan   CPE without  pap FIT to return in 2 weeks. Shingrix #1/2.  Discussed getting a second in 6 months when in [place]. Moderna bivalent booster. CBC, CMP  2.  History of right breast cancer:  Family moving to [place], though not clear her son will move with them,  hopefully in a month's time.   She plans to have check up with Dr.  Pamelia Hoit before leaving.   Not clear if will wait for mammogram due in August.    3.  Dyslipidemia:  nonfasting today--had a green smoothie around 6 a.m.

## 2021-07-20 LAB — CBC WITH DIFFERENTIAL/PLATELET
Basophils Absolute: 0 10*3/uL (ref 0.0–0.2)
Basos: 1 %
EOS (ABSOLUTE): 0.1 10*3/uL (ref 0.0–0.4)
Eos: 3 %
Hematocrit: 38.8 % (ref 34.0–46.6)
Hemoglobin: 12.9 g/dL (ref 11.1–15.9)
Immature Grans (Abs): 0 10*3/uL (ref 0.0–0.1)
Immature Granulocytes: 0 %
Lymphocytes Absolute: 2.4 10*3/uL (ref 0.7–3.1)
Lymphs: 42 %
MCH: 30.2 pg (ref 26.6–33.0)
MCHC: 33.2 g/dL (ref 31.5–35.7)
MCV: 91 fL (ref 79–97)
Monocytes Absolute: 0.5 10*3/uL (ref 0.1–0.9)
Monocytes: 8 %
Neutrophils Absolute: 2.6 10*3/uL (ref 1.4–7.0)
Neutrophils: 46 %
Platelets: 195 10*3/uL (ref 150–450)
RBC: 4.27 x10E6/uL (ref 3.77–5.28)
RDW: 12.7 % (ref 11.7–15.4)
WBC: 5.6 10*3/uL (ref 3.4–10.8)

## 2021-07-20 LAB — COMPREHENSIVE METABOLIC PANEL
ALT: 39 IU/L — ABNORMAL HIGH (ref 0–32)
AST: 25 IU/L (ref 0–40)
Albumin/Globulin Ratio: 2.2 (ref 1.2–2.2)
Albumin: 4.6 g/dL (ref 3.8–4.8)
Alkaline Phosphatase: 77 IU/L (ref 44–121)
BUN/Creatinine Ratio: 17 (ref 9–23)
BUN: 11 mg/dL (ref 6–24)
Bilirubin Total: 0.4 mg/dL (ref 0.0–1.2)
CO2: 22 mmol/L (ref 20–29)
Calcium: 8.8 mg/dL (ref 8.7–10.2)
Chloride: 105 mmol/L (ref 96–106)
Creatinine, Ser: 0.63 mg/dL (ref 0.57–1.00)
Globulin, Total: 2.1 g/dL (ref 1.5–4.5)
Glucose: 71 mg/dL (ref 70–99)
Potassium: 3.5 mmol/L (ref 3.5–5.2)
Sodium: 140 mmol/L (ref 134–144)
Total Protein: 6.7 g/dL (ref 6.0–8.5)
eGFR: 108 mL/min/{1.73_m2} (ref >59)

## 2021-07-20 LAB — LIPID PANEL W/O CHOL/HDL RATIO
Cholesterol, Total: 165 mg/dL (ref 100–199)
HDL: 36 mg/dL — ABNORMAL LOW (ref >39)
LDL Chol Calc (NIH): 102 mg/dL — ABNORMAL HIGH (ref 0–99)
Triglycerides: 151 mg/dL — ABNORMAL HIGH (ref 0–149)
VLDL Cholesterol Cal: 27 mg/dL (ref 5–40)

## 2021-07-28 ENCOUNTER — Other Ambulatory Visit: Payer: Self-pay | Admitting: Obstetrics and Gynecology

## 2021-07-28 DIAGNOSIS — Z1231 Encounter for screening mammogram for malignant neoplasm of breast: Secondary | ICD-10-CM

## 2021-08-02 ENCOUNTER — Other Ambulatory Visit: Payer: Self-pay | Admitting: Hematology and Oncology

## 2021-08-02 ENCOUNTER — Other Ambulatory Visit (HOSPITAL_COMMUNITY): Payer: Self-pay

## 2021-08-03 ENCOUNTER — Other Ambulatory Visit: Payer: Self-pay | Admitting: *Deleted

## 2021-08-03 ENCOUNTER — Other Ambulatory Visit (HOSPITAL_COMMUNITY): Payer: Self-pay

## 2021-08-03 MED ORDER — TAMOXIFEN CITRATE 20 MG PO TABS: ORAL_TABLET | Freq: Every day | ORAL | 0 refills | Status: DC

## 2021-08-24 ENCOUNTER — Other Ambulatory Visit: Payer: Self-pay | Admitting: Hematology and Oncology

## 2021-08-27 ENCOUNTER — Inpatient Hospital Stay: Payer: Self-pay | Attending: Hematology and Oncology | Admitting: Hematology and Oncology

## 2021-08-27 NOTE — Assessment & Plan Note (Deleted)
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 Adjuvant radiation therapy started 08/03/2015 followed by 2. Adjuvant antiestrogen therapy with tamoxifen 20 mg dailystarted 09/28/2015  Tamoxifen Toxicities: Hot flashes and myalgias: I instructed her to take tamoxifen in the morning and for the myalgias she will take over-the-counter magnesium supplementation.  Breast cancer surveillance: 1.Breast exam 08/27/2021: Benign 2.Mammogram  09/01/2020: No evidence of malignancy breast density category C Patient may go to [place]   Return to clinic on an as-needed basis

## 2021-09-16 ENCOUNTER — Ambulatory Visit: Payer: Self-pay

## 2021-10-07 ENCOUNTER — Encounter: Payer: Self-pay | Admitting: Internal Medicine
